# Patient Record
Sex: Female | Born: 1935 | Race: White | Hispanic: No | Marital: Married | Smoking: Former smoker
Health system: Southern US, Community
[De-identification: ages and names within clinical notes are randomized; demographics above are authoritative.]

## PROBLEM LIST (undated history)

## (undated) DIAGNOSIS — E785 Hyperlipidemia, unspecified: Secondary | ICD-10-CM

## (undated) DIAGNOSIS — N390 Urinary tract infection, site not specified: Secondary | ICD-10-CM

## (undated) DIAGNOSIS — Z5189 Encounter for other specified aftercare: Secondary | ICD-10-CM

## (undated) DIAGNOSIS — D497 Neoplasm of unspecified behavior of endocrine glands and other parts of nervous system: Secondary | ICD-10-CM

## (undated) DIAGNOSIS — E8881 Metabolic syndrome: Secondary | ICD-10-CM

## (undated) DIAGNOSIS — R002 Palpitations: Secondary | ICD-10-CM

## (undated) DIAGNOSIS — I10 Essential (primary) hypertension: Secondary | ICD-10-CM

## (undated) DIAGNOSIS — K589 Irritable bowel syndrome without diarrhea: Secondary | ICD-10-CM

## (undated) DIAGNOSIS — D352 Benign neoplasm of pituitary gland: Secondary | ICD-10-CM

## (undated) DIAGNOSIS — B029 Zoster without complications: Secondary | ICD-10-CM

## (undated) DIAGNOSIS — M419 Scoliosis, unspecified: Secondary | ICD-10-CM

## (undated) DIAGNOSIS — IMO0001 Reserved for inherently not codable concepts without codable children: Secondary | ICD-10-CM

## (undated) DIAGNOSIS — J449 Chronic obstructive pulmonary disease, unspecified: Secondary | ICD-10-CM

## (undated) DIAGNOSIS — K2100 Gastro-esophageal reflux disease with esophagitis, without bleeding: Secondary | ICD-10-CM

## (undated) DIAGNOSIS — G2581 Restless legs syndrome: Secondary | ICD-10-CM

## (undated) HISTORY — DX: Essential (primary) hypertension: I10

## (undated) HISTORY — PX: ANKLE FRACTURE SURGERY: SHX122

## (undated) HISTORY — DX: Metabolic syndrome: E88.810

## (undated) HISTORY — DX: Scoliosis, unspecified: M41.9

## (undated) HISTORY — DX: Palpitations: R00.2

## (undated) HISTORY — PX: CHOLECYSTECTOMY: SHX55

## (undated) HISTORY — PX: PARTIAL HYSTERECTOMY: SHX80

## (undated) HISTORY — DX: Hyperlipidemia, unspecified: E78.5

## (undated) HISTORY — DX: Benign neoplasm of pituitary gland: D35.2

## (undated) HISTORY — PX: BREAST EXCISIONAL BIOPSY: SUR124

## (undated) HISTORY — DX: Metabolic syndrome: E88.81

## (undated) HISTORY — PX: FINGER AMPUTATION: SHX636

## (undated) HISTORY — DX: Urinary tract infection, site not specified: N39.0

## (undated) HISTORY — DX: Irritable bowel syndrome, unspecified: K58.9

## (undated) HISTORY — DX: Gastro-esophageal reflux disease with esophagitis, without bleeding: K21.00

## (undated) HISTORY — DX: Neoplasm of unspecified behavior of endocrine glands and other parts of nervous system: D49.7

## (undated) HISTORY — DX: Chronic obstructive pulmonary disease, unspecified: J44.9

## (undated) HISTORY — PX: OTHER SURGICAL HISTORY: SHX169

## (undated) HISTORY — DX: Restless legs syndrome: G25.81

## (undated) HISTORY — DX: Gastro-esophageal reflux disease with esophagitis: K21.0

---

## 1989-08-10 DIAGNOSIS — D352 Benign neoplasm of pituitary gland: Secondary | ICD-10-CM

## 1989-08-10 HISTORY — DX: Benign neoplasm of pituitary gland: D35.2

## 1998-03-07 ENCOUNTER — Emergency Department (HOSPITAL_COMMUNITY): Admission: EM | Admit: 1998-03-07 | Discharge: 1998-03-07 | Payer: Self-pay

## 1999-09-26 ENCOUNTER — Ambulatory Visit (HOSPITAL_COMMUNITY): Admission: RE | Admit: 1999-09-26 | Discharge: 1999-09-26 | Payer: Self-pay | Admitting: Neurosurgery

## 1999-09-26 ENCOUNTER — Encounter: Payer: Self-pay | Admitting: Neurosurgery

## 1999-11-18 ENCOUNTER — Encounter (INDEPENDENT_AMBULATORY_CARE_PROVIDER_SITE_OTHER): Payer: Self-pay | Admitting: *Deleted

## 1999-11-18 ENCOUNTER — Ambulatory Visit (HOSPITAL_COMMUNITY): Admission: RE | Admit: 1999-11-18 | Discharge: 1999-11-18 | Payer: Self-pay | Admitting: *Deleted

## 1999-11-20 ENCOUNTER — Encounter: Payer: Self-pay | Admitting: Internal Medicine

## 1999-11-20 ENCOUNTER — Encounter: Admission: RE | Admit: 1999-11-20 | Discharge: 1999-11-20 | Payer: Self-pay | Admitting: Internal Medicine

## 2001-01-14 ENCOUNTER — Encounter: Admission: RE | Admit: 2001-01-14 | Discharge: 2001-01-14 | Payer: Self-pay | Admitting: Obstetrics and Gynecology

## 2001-01-14 ENCOUNTER — Encounter: Payer: Self-pay | Admitting: Obstetrics and Gynecology

## 2001-06-15 ENCOUNTER — Encounter: Admission: RE | Admit: 2001-06-15 | Discharge: 2001-06-15 | Payer: Self-pay | Admitting: Obstetrics and Gynecology

## 2001-06-15 ENCOUNTER — Encounter: Payer: Self-pay | Admitting: Obstetrics and Gynecology

## 2002-05-22 ENCOUNTER — Encounter: Admission: RE | Admit: 2002-05-22 | Discharge: 2002-05-22 | Payer: Self-pay | Admitting: Obstetrics and Gynecology

## 2002-05-22 ENCOUNTER — Encounter: Payer: Self-pay | Admitting: Obstetrics and Gynecology

## 2002-06-28 ENCOUNTER — Other Ambulatory Visit: Admission: RE | Admit: 2002-06-28 | Discharge: 2002-06-28 | Payer: Self-pay | Admitting: Obstetrics and Gynecology

## 2003-01-09 HISTORY — PX: ESOPHAGEAL DILATION: SHX303

## 2003-03-15 ENCOUNTER — Encounter: Admission: RE | Admit: 2003-03-15 | Discharge: 2003-03-15 | Payer: Self-pay | Admitting: Orthopedic Surgery

## 2003-03-15 ENCOUNTER — Encounter: Payer: Self-pay | Admitting: Orthopedic Surgery

## 2003-09-28 ENCOUNTER — Encounter: Admission: RE | Admit: 2003-09-28 | Discharge: 2003-09-28 | Payer: Self-pay | Admitting: Obstetrics and Gynecology

## 2004-04-25 ENCOUNTER — Encounter: Admission: RE | Admit: 2004-04-25 | Discharge: 2004-04-25 | Payer: Self-pay | Admitting: Internal Medicine

## 2004-06-12 ENCOUNTER — Ambulatory Visit: Payer: Self-pay | Admitting: Gastroenterology

## 2004-06-25 ENCOUNTER — Ambulatory Visit: Payer: Self-pay | Admitting: Internal Medicine

## 2004-06-27 ENCOUNTER — Ambulatory Visit: Payer: Self-pay | Admitting: Internal Medicine

## 2004-08-29 ENCOUNTER — Ambulatory Visit: Payer: Self-pay | Admitting: Cardiology

## 2004-09-09 ENCOUNTER — Ambulatory Visit: Payer: Self-pay

## 2004-09-09 ENCOUNTER — Ambulatory Visit: Payer: Self-pay | Admitting: Cardiology

## 2004-10-16 ENCOUNTER — Ambulatory Visit: Payer: Self-pay | Admitting: Cardiology

## 2004-12-25 ENCOUNTER — Encounter: Admission: RE | Admit: 2004-12-25 | Discharge: 2004-12-25 | Payer: Self-pay | Admitting: Obstetrics and Gynecology

## 2005-01-19 ENCOUNTER — Ambulatory Visit: Payer: Self-pay | Admitting: Internal Medicine

## 2005-09-24 ENCOUNTER — Ambulatory Visit: Payer: Self-pay | Admitting: Internal Medicine

## 2005-10-01 ENCOUNTER — Ambulatory Visit: Payer: Self-pay | Admitting: Internal Medicine

## 2006-03-19 ENCOUNTER — Ambulatory Visit: Payer: Self-pay | Admitting: Internal Medicine

## 2006-04-06 ENCOUNTER — Encounter: Admission: RE | Admit: 2006-04-06 | Discharge: 2006-04-06 | Payer: Self-pay | Admitting: Internal Medicine

## 2006-05-26 ENCOUNTER — Ambulatory Visit: Payer: Self-pay | Admitting: Internal Medicine

## 2006-09-15 ENCOUNTER — Ambulatory Visit: Payer: Self-pay | Admitting: Internal Medicine

## 2006-09-21 ENCOUNTER — Ambulatory Visit: Payer: Self-pay | Admitting: Internal Medicine

## 2006-09-21 LAB — CONVERTED CEMR LAB
ALT: 9 units/L (ref 0–40)
BUN: 13 mg/dL (ref 6–23)
Basophils Relative: 1.6 % — ABNORMAL HIGH (ref 0.0–1.0)
Creatinine, Ser: 0.8 mg/dL (ref 0.4–1.2)
Eosinophils Relative: 4.2 % (ref 0.0–5.0)
Hemoglobin: 14.5 g/dL (ref 12.0–15.0)
Lymphocytes Relative: 22.8 % (ref 12.0–46.0)
Monocytes Absolute: 0.5 10*3/uL (ref 0.2–0.7)
Neutro Abs: 3.3 10*3/uL (ref 1.4–7.7)
Platelets: 274 10*3/uL (ref 150–400)
Potassium: 4.5 meq/L (ref 3.5–5.1)
RDW: 12.2 % (ref 11.5–14.6)
VLDL: 24 mg/dL (ref 0–40)
WBC: 5.3 10*3/uL (ref 4.5–10.5)

## 2006-09-24 ENCOUNTER — Ambulatory Visit: Payer: Self-pay

## 2006-10-05 ENCOUNTER — Ambulatory Visit: Payer: Self-pay | Admitting: Family Medicine

## 2006-10-06 ENCOUNTER — Ambulatory Visit: Payer: Self-pay | Admitting: Internal Medicine

## 2006-10-28 ENCOUNTER — Ambulatory Visit: Payer: Self-pay | Admitting: Internal Medicine

## 2006-11-24 DIAGNOSIS — Z9079 Acquired absence of other genital organ(s): Secondary | ICD-10-CM | POA: Insufficient documentation

## 2006-11-24 DIAGNOSIS — D352 Benign neoplasm of pituitary gland: Secondary | ICD-10-CM | POA: Insufficient documentation

## 2006-11-24 DIAGNOSIS — D353 Benign neoplasm of craniopharyngeal duct: Secondary | ICD-10-CM

## 2007-02-28 ENCOUNTER — Ambulatory Visit: Payer: Self-pay | Admitting: Internal Medicine

## 2007-02-28 DIAGNOSIS — G2581 Restless legs syndrome: Secondary | ICD-10-CM | POA: Insufficient documentation

## 2007-02-28 DIAGNOSIS — K589 Irritable bowel syndrome without diarrhea: Secondary | ICD-10-CM | POA: Insufficient documentation

## 2007-03-14 ENCOUNTER — Telehealth: Payer: Self-pay | Admitting: Internal Medicine

## 2007-05-20 ENCOUNTER — Encounter: Payer: Self-pay | Admitting: Internal Medicine

## 2007-06-30 ENCOUNTER — Ambulatory Visit: Payer: Self-pay | Admitting: Internal Medicine

## 2007-08-05 ENCOUNTER — Ambulatory Visit: Payer: Self-pay | Admitting: Internal Medicine

## 2007-08-05 DIAGNOSIS — E8881 Metabolic syndrome: Secondary | ICD-10-CM

## 2007-08-05 DIAGNOSIS — E782 Mixed hyperlipidemia: Secondary | ICD-10-CM

## 2007-08-05 DIAGNOSIS — E785 Hyperlipidemia, unspecified: Secondary | ICD-10-CM | POA: Insufficient documentation

## 2007-08-05 DIAGNOSIS — M2550 Pain in unspecified joint: Secondary | ICD-10-CM | POA: Insufficient documentation

## 2007-08-05 DIAGNOSIS — R002 Palpitations: Secondary | ICD-10-CM | POA: Insufficient documentation

## 2007-08-05 DIAGNOSIS — Z8719 Personal history of other diseases of the digestive system: Secondary | ICD-10-CM

## 2007-08-11 HISTORY — PX: KNEE SURGERY: SHX244

## 2007-08-12 ENCOUNTER — Telehealth (INDEPENDENT_AMBULATORY_CARE_PROVIDER_SITE_OTHER): Payer: Self-pay | Admitting: *Deleted

## 2007-08-15 ENCOUNTER — Encounter (INDEPENDENT_AMBULATORY_CARE_PROVIDER_SITE_OTHER): Payer: Self-pay | Admitting: *Deleted

## 2007-08-16 ENCOUNTER — Ambulatory Visit: Payer: Self-pay | Admitting: Internal Medicine

## 2007-08-16 ENCOUNTER — Encounter (INDEPENDENT_AMBULATORY_CARE_PROVIDER_SITE_OTHER): Payer: Self-pay | Admitting: *Deleted

## 2007-08-17 ENCOUNTER — Ambulatory Visit: Payer: Self-pay | Admitting: Internal Medicine

## 2007-08-17 ENCOUNTER — Inpatient Hospital Stay (HOSPITAL_COMMUNITY): Admission: RE | Admit: 2007-08-17 | Discharge: 2007-08-22 | Payer: Self-pay | Admitting: Orthopedic Surgery

## 2007-08-24 ENCOUNTER — Encounter: Payer: Self-pay | Admitting: Internal Medicine

## 2007-08-31 ENCOUNTER — Ambulatory Visit: Payer: Self-pay | Admitting: Internal Medicine

## 2007-08-31 DIAGNOSIS — I10 Essential (primary) hypertension: Secondary | ICD-10-CM | POA: Insufficient documentation

## 2007-08-31 DIAGNOSIS — E041 Nontoxic single thyroid nodule: Secondary | ICD-10-CM

## 2007-09-02 ENCOUNTER — Ambulatory Visit: Payer: Self-pay | Admitting: Internal Medicine

## 2007-09-02 DIAGNOSIS — Z96659 Presence of unspecified artificial knee joint: Secondary | ICD-10-CM

## 2007-09-02 LAB — CONVERTED CEMR LAB
INR: 1.7
Prothrombin Time: 16 s

## 2007-09-05 ENCOUNTER — Encounter: Admission: RE | Admit: 2007-09-05 | Discharge: 2007-09-05 | Payer: Self-pay | Admitting: Internal Medicine

## 2007-09-06 ENCOUNTER — Encounter (INDEPENDENT_AMBULATORY_CARE_PROVIDER_SITE_OTHER): Payer: Self-pay | Admitting: *Deleted

## 2007-09-07 ENCOUNTER — Ambulatory Visit: Payer: Self-pay | Admitting: Internal Medicine

## 2007-09-07 LAB — CONVERTED CEMR LAB: INR: 3.8

## 2007-09-13 ENCOUNTER — Ambulatory Visit: Payer: Self-pay | Admitting: Internal Medicine

## 2007-09-13 LAB — CONVERTED CEMR LAB
INR: 3.1
Prothrombin Time: 21.3 s

## 2007-09-28 ENCOUNTER — Encounter: Admission: RE | Admit: 2007-09-28 | Discharge: 2007-09-28 | Payer: Self-pay | Admitting: Internal Medicine

## 2007-10-03 ENCOUNTER — Encounter: Payer: Self-pay | Admitting: Internal Medicine

## 2007-10-03 ENCOUNTER — Encounter (INDEPENDENT_AMBULATORY_CARE_PROVIDER_SITE_OTHER): Payer: Self-pay | Admitting: *Deleted

## 2007-10-14 ENCOUNTER — Ambulatory Visit: Payer: Self-pay | Admitting: Internal Medicine

## 2007-10-17 ENCOUNTER — Encounter (INDEPENDENT_AMBULATORY_CARE_PROVIDER_SITE_OTHER): Payer: Self-pay | Admitting: *Deleted

## 2007-11-16 ENCOUNTER — Encounter: Payer: Self-pay | Admitting: Internal Medicine

## 2007-12-08 ENCOUNTER — Ambulatory Visit: Payer: Self-pay | Admitting: Internal Medicine

## 2007-12-08 DIAGNOSIS — K5900 Constipation, unspecified: Secondary | ICD-10-CM | POA: Insufficient documentation

## 2007-12-08 LAB — CONVERTED CEMR LAB
BUN: 12 mg/dL (ref 6–23)
Creatinine, Ser: 0.6 mg/dL (ref 0.4–1.2)
Hgb A1c MFr Bld: 6.2 % — ABNORMAL HIGH (ref 4.6–6.0)
LDL Cholesterol: 87 mg/dL (ref 0–99)
TSH: 1.22 microintl units/mL (ref 0.35–5.50)
Total CHOL/HDL Ratio: 3.6
Triglycerides: 147 mg/dL (ref 0–149)

## 2007-12-12 ENCOUNTER — Telehealth (INDEPENDENT_AMBULATORY_CARE_PROVIDER_SITE_OTHER): Payer: Self-pay | Admitting: *Deleted

## 2007-12-12 ENCOUNTER — Emergency Department (HOSPITAL_COMMUNITY): Admission: EM | Admit: 2007-12-12 | Discharge: 2007-12-12 | Payer: Self-pay | Admitting: Emergency Medicine

## 2007-12-20 ENCOUNTER — Encounter (INDEPENDENT_AMBULATORY_CARE_PROVIDER_SITE_OTHER): Payer: Self-pay | Admitting: *Deleted

## 2008-01-05 ENCOUNTER — Ambulatory Visit: Payer: Self-pay | Admitting: Internal Medicine

## 2008-01-05 DIAGNOSIS — J309 Allergic rhinitis, unspecified: Secondary | ICD-10-CM | POA: Insufficient documentation

## 2008-01-05 LAB — CONVERTED CEMR LAB
Blood in Urine, dipstick: NEGATIVE
Glucose, Urine, Semiquant: NEGATIVE
Ketones, urine, test strip: NEGATIVE
Nitrite: NEGATIVE
Protein, U semiquant: NEGATIVE
Specific Gravity, Urine: <1.005
pH: 6.5

## 2008-01-07 ENCOUNTER — Encounter: Payer: Self-pay | Admitting: Internal Medicine

## 2008-01-09 ENCOUNTER — Encounter (INDEPENDENT_AMBULATORY_CARE_PROVIDER_SITE_OTHER): Payer: Self-pay | Admitting: *Deleted

## 2008-02-21 ENCOUNTER — Encounter: Admission: RE | Admit: 2008-02-21 | Discharge: 2008-02-21 | Payer: Self-pay | Admitting: Internal Medicine

## 2008-02-29 ENCOUNTER — Telehealth (INDEPENDENT_AMBULATORY_CARE_PROVIDER_SITE_OTHER): Payer: Self-pay | Admitting: *Deleted

## 2008-03-02 ENCOUNTER — Telehealth (INDEPENDENT_AMBULATORY_CARE_PROVIDER_SITE_OTHER): Payer: Self-pay | Admitting: *Deleted

## 2008-05-17 ENCOUNTER — Ambulatory Visit: Payer: Self-pay | Admitting: Internal Medicine

## 2008-06-08 ENCOUNTER — Encounter: Admission: RE | Admit: 2008-06-08 | Discharge: 2008-06-08 | Payer: Self-pay | Admitting: Obstetrics and Gynecology

## 2008-08-17 ENCOUNTER — Ambulatory Visit: Payer: Self-pay | Admitting: Internal Medicine

## 2008-08-17 DIAGNOSIS — E669 Obesity, unspecified: Secondary | ICD-10-CM | POA: Insufficient documentation

## 2008-08-17 DIAGNOSIS — T887XXA Unspecified adverse effect of drug or medicament, initial encounter: Secondary | ICD-10-CM | POA: Insufficient documentation

## 2008-08-21 ENCOUNTER — Encounter (INDEPENDENT_AMBULATORY_CARE_PROVIDER_SITE_OTHER): Payer: Self-pay | Admitting: *Deleted

## 2008-08-21 ENCOUNTER — Telehealth (INDEPENDENT_AMBULATORY_CARE_PROVIDER_SITE_OTHER): Payer: Self-pay | Admitting: *Deleted

## 2008-08-29 ENCOUNTER — Telehealth: Payer: Self-pay | Admitting: Internal Medicine

## 2008-08-29 ENCOUNTER — Inpatient Hospital Stay (HOSPITAL_COMMUNITY): Admission: RE | Admit: 2008-08-29 | Discharge: 2008-09-03 | Payer: Self-pay | Admitting: Orthopedic Surgery

## 2008-09-04 ENCOUNTER — Telehealth: Payer: Self-pay | Admitting: Internal Medicine

## 2008-09-27 ENCOUNTER — Emergency Department (HOSPITAL_COMMUNITY): Admission: EM | Admit: 2008-09-27 | Discharge: 2008-09-28 | Payer: Self-pay | Admitting: Emergency Medicine

## 2008-09-28 ENCOUNTER — Ambulatory Visit: Payer: Self-pay | Admitting: Internal Medicine

## 2008-09-28 DIAGNOSIS — R82998 Other abnormal findings in urine: Secondary | ICD-10-CM

## 2008-09-28 DIAGNOSIS — R109 Unspecified abdominal pain: Secondary | ICD-10-CM | POA: Insufficient documentation

## 2008-09-29 ENCOUNTER — Encounter: Payer: Self-pay | Admitting: Internal Medicine

## 2008-10-01 ENCOUNTER — Telehealth: Payer: Self-pay | Admitting: Internal Medicine

## 2008-10-01 ENCOUNTER — Ambulatory Visit: Payer: Self-pay | Admitting: Internal Medicine

## 2008-10-01 DIAGNOSIS — R6883 Chills (without fever): Secondary | ICD-10-CM

## 2008-10-01 DIAGNOSIS — R61 Generalized hyperhidrosis: Secondary | ICD-10-CM | POA: Insufficient documentation

## 2008-10-01 DIAGNOSIS — R5383 Other fatigue: Secondary | ICD-10-CM

## 2008-10-01 DIAGNOSIS — R5381 Other malaise: Secondary | ICD-10-CM

## 2008-10-01 DIAGNOSIS — R630 Anorexia: Secondary | ICD-10-CM

## 2008-10-03 ENCOUNTER — Encounter (INDEPENDENT_AMBULATORY_CARE_PROVIDER_SITE_OTHER): Payer: Self-pay | Admitting: *Deleted

## 2008-10-03 LAB — CONVERTED CEMR LAB
Albumin: 3.9 g/dL (ref 3.5–5.2)
Alkaline Phosphatase: 63 units/L (ref 39–117)
Basophils Absolute: 0 10*3/uL (ref 0.0–0.1)
Bilirubin, Direct: 0.1 mg/dL (ref 0.0–0.3)
Chloride: 106 meq/L (ref 96–112)
Eosinophils Absolute: 0.2 10*3/uL (ref 0.0–0.7)
GFR calc Af Amer: 106 mL/min
GFR calc non Af Amer: 88 mL/min
HCT: 38.1 % (ref 36.0–46.0)
MCV: 92.6 fL (ref 78.0–100.0)
Monocytes Absolute: 0.3 10*3/uL (ref 0.1–1.0)
Neutrophils Relative %: 68.8 % (ref 43.0–77.0)
Platelets: 282 10*3/uL (ref 150–400)
Potassium: 4.8 meq/L (ref 3.5–5.1)
RDW: 13.8 % (ref 11.5–14.6)
Sodium: 143 meq/L (ref 135–145)
Total Bilirubin: 0.7 mg/dL (ref 0.3–1.2)

## 2008-10-15 ENCOUNTER — Encounter: Payer: Self-pay | Admitting: Internal Medicine

## 2008-10-15 ENCOUNTER — Ambulatory Visit: Payer: Self-pay | Admitting: Internal Medicine

## 2008-11-01 ENCOUNTER — Ambulatory Visit: Payer: Self-pay | Admitting: Internal Medicine

## 2008-11-01 DIAGNOSIS — M48061 Spinal stenosis, lumbar region without neurogenic claudication: Secondary | ICD-10-CM

## 2008-11-08 ENCOUNTER — Encounter (INDEPENDENT_AMBULATORY_CARE_PROVIDER_SITE_OTHER): Payer: Self-pay | Admitting: *Deleted

## 2008-12-12 ENCOUNTER — Encounter: Payer: Self-pay | Admitting: Internal Medicine

## 2009-03-13 ENCOUNTER — Encounter: Admission: RE | Admit: 2009-03-13 | Discharge: 2009-03-13 | Payer: Self-pay | Admitting: Internal Medicine

## 2009-03-20 ENCOUNTER — Ambulatory Visit: Payer: Self-pay | Admitting: Internal Medicine

## 2009-03-20 DIAGNOSIS — R635 Abnormal weight gain: Secondary | ICD-10-CM | POA: Insufficient documentation

## 2009-03-20 DIAGNOSIS — R209 Unspecified disturbances of skin sensation: Secondary | ICD-10-CM

## 2009-03-20 LAB — CONVERTED CEMR LAB
Folate: 14.1 ng/mL
Iron: 99 ug/dL (ref 42–145)
Saturation Ratios: 27.7 % (ref 20.0–50.0)
TSH: 1.45 microintl units/mL (ref 0.35–5.50)
Transferrin: 255.7 mg/dL (ref 212.0–360.0)

## 2009-03-21 ENCOUNTER — Encounter (INDEPENDENT_AMBULATORY_CARE_PROVIDER_SITE_OTHER): Payer: Self-pay | Admitting: *Deleted

## 2009-03-29 ENCOUNTER — Ambulatory Visit: Payer: Self-pay | Admitting: Internal Medicine

## 2009-03-29 DIAGNOSIS — J449 Chronic obstructive pulmonary disease, unspecified: Secondary | ICD-10-CM

## 2009-03-29 DIAGNOSIS — J4489 Other specified chronic obstructive pulmonary disease: Secondary | ICD-10-CM | POA: Insufficient documentation

## 2009-04-29 ENCOUNTER — Encounter: Payer: Self-pay | Admitting: Internal Medicine

## 2009-07-10 ENCOUNTER — Telehealth: Payer: Self-pay | Admitting: Internal Medicine

## 2009-07-11 ENCOUNTER — Ambulatory Visit: Payer: Self-pay | Admitting: Internal Medicine

## 2009-07-12 ENCOUNTER — Encounter (INDEPENDENT_AMBULATORY_CARE_PROVIDER_SITE_OTHER): Payer: Self-pay | Admitting: *Deleted

## 2009-09-02 ENCOUNTER — Telehealth (INDEPENDENT_AMBULATORY_CARE_PROVIDER_SITE_OTHER): Payer: Self-pay | Admitting: *Deleted

## 2009-10-11 ENCOUNTER — Telehealth (INDEPENDENT_AMBULATORY_CARE_PROVIDER_SITE_OTHER): Payer: Self-pay | Admitting: *Deleted

## 2009-12-13 ENCOUNTER — Encounter: Payer: Self-pay | Admitting: Internal Medicine

## 2009-12-19 ENCOUNTER — Encounter: Payer: Self-pay | Admitting: Internal Medicine

## 2009-12-20 ENCOUNTER — Encounter (INDEPENDENT_AMBULATORY_CARE_PROVIDER_SITE_OTHER): Payer: Self-pay | Admitting: *Deleted

## 2010-01-07 ENCOUNTER — Ambulatory Visit: Payer: Self-pay | Admitting: Internal Medicine

## 2010-01-07 DIAGNOSIS — H3560 Retinal hemorrhage, unspecified eye: Secondary | ICD-10-CM | POA: Insufficient documentation

## 2010-01-09 ENCOUNTER — Ambulatory Visit: Payer: Self-pay | Admitting: Internal Medicine

## 2010-01-09 LAB — CONVERTED CEMR LAB
ALT: 12 units/L (ref 0–35)
Albumin: 4.2 g/dL (ref 3.5–5.2)
Basophils Relative: 1.5 % (ref 0.0–3.0)
Bilirubin, Direct: 0.2 mg/dL (ref 0.0–0.3)
HCT: 41.1 % (ref 36.0–46.0)
Hemoglobin: 14.4 g/dL (ref 12.0–15.0)
Lymphocytes Relative: 23.7 % (ref 12.0–46.0)
MCHC: 35 g/dL (ref 30.0–36.0)
Monocytes Relative: 11.5 % (ref 3.0–12.0)
Neutro Abs: 2.6 10*3/uL (ref 1.4–7.7)
RBC: 4.43 M/uL (ref 3.87–5.11)
Total CHOL/HDL Ratio: 3
Total Protein: 6.5 g/dL (ref 6.0–8.3)
Triglycerides: 140 mg/dL (ref 0.0–149.0)

## 2010-01-14 ENCOUNTER — Ambulatory Visit: Payer: Self-pay | Admitting: Internal Medicine

## 2010-01-14 DIAGNOSIS — R509 Fever, unspecified: Secondary | ICD-10-CM

## 2010-01-14 DIAGNOSIS — L049 Acute lymphadenitis, unspecified: Secondary | ICD-10-CM

## 2010-04-07 IMAGING — CR DG CHEST 2V
2 series · 2 of 2 positions shown · non-contrast
Comparison: 08/18/2007.

CLINICAL DATA: Osteoarthritis of the right knee.  Preoperative
chest.

CHEST - 2 VIEW

[w chest pa]
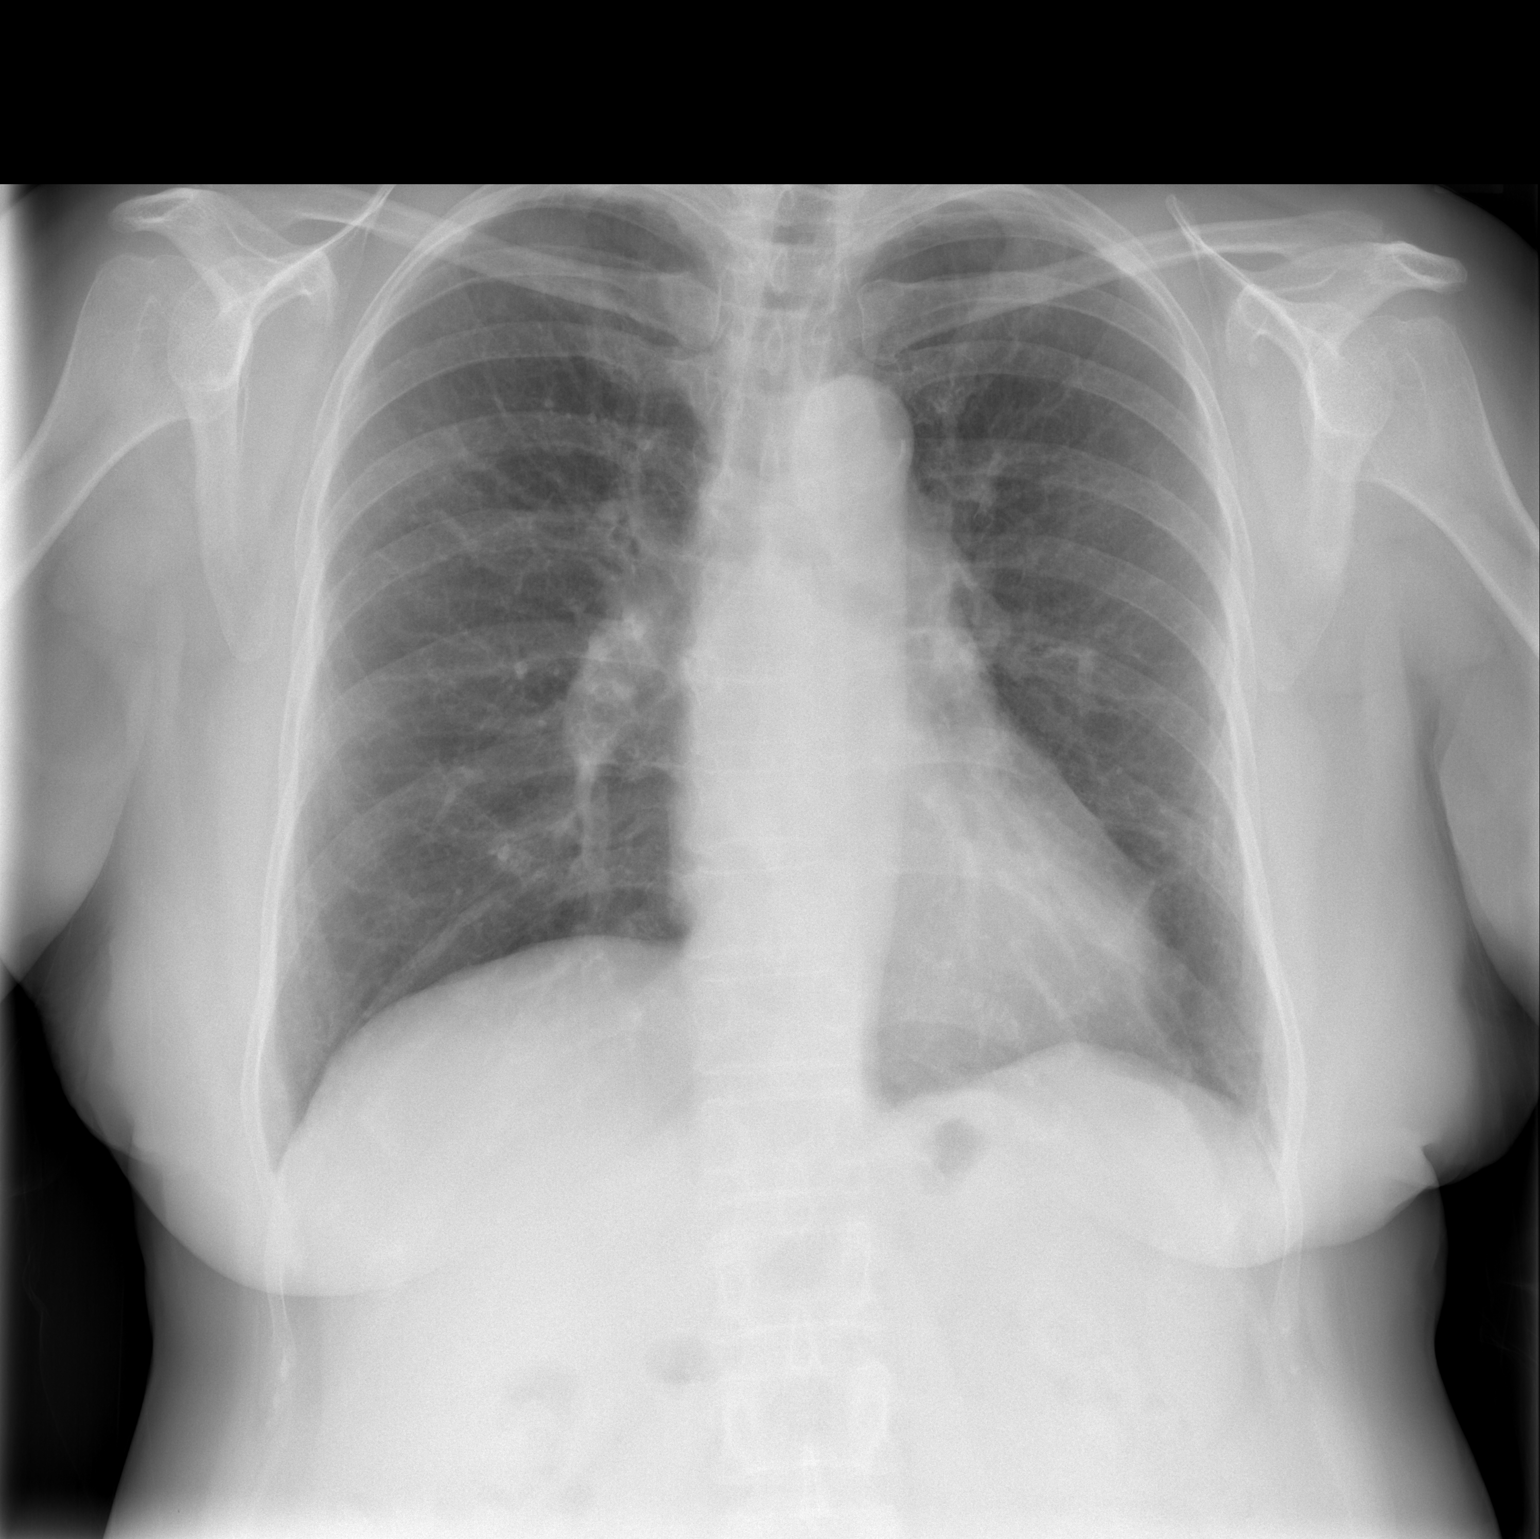

[w chest lat]
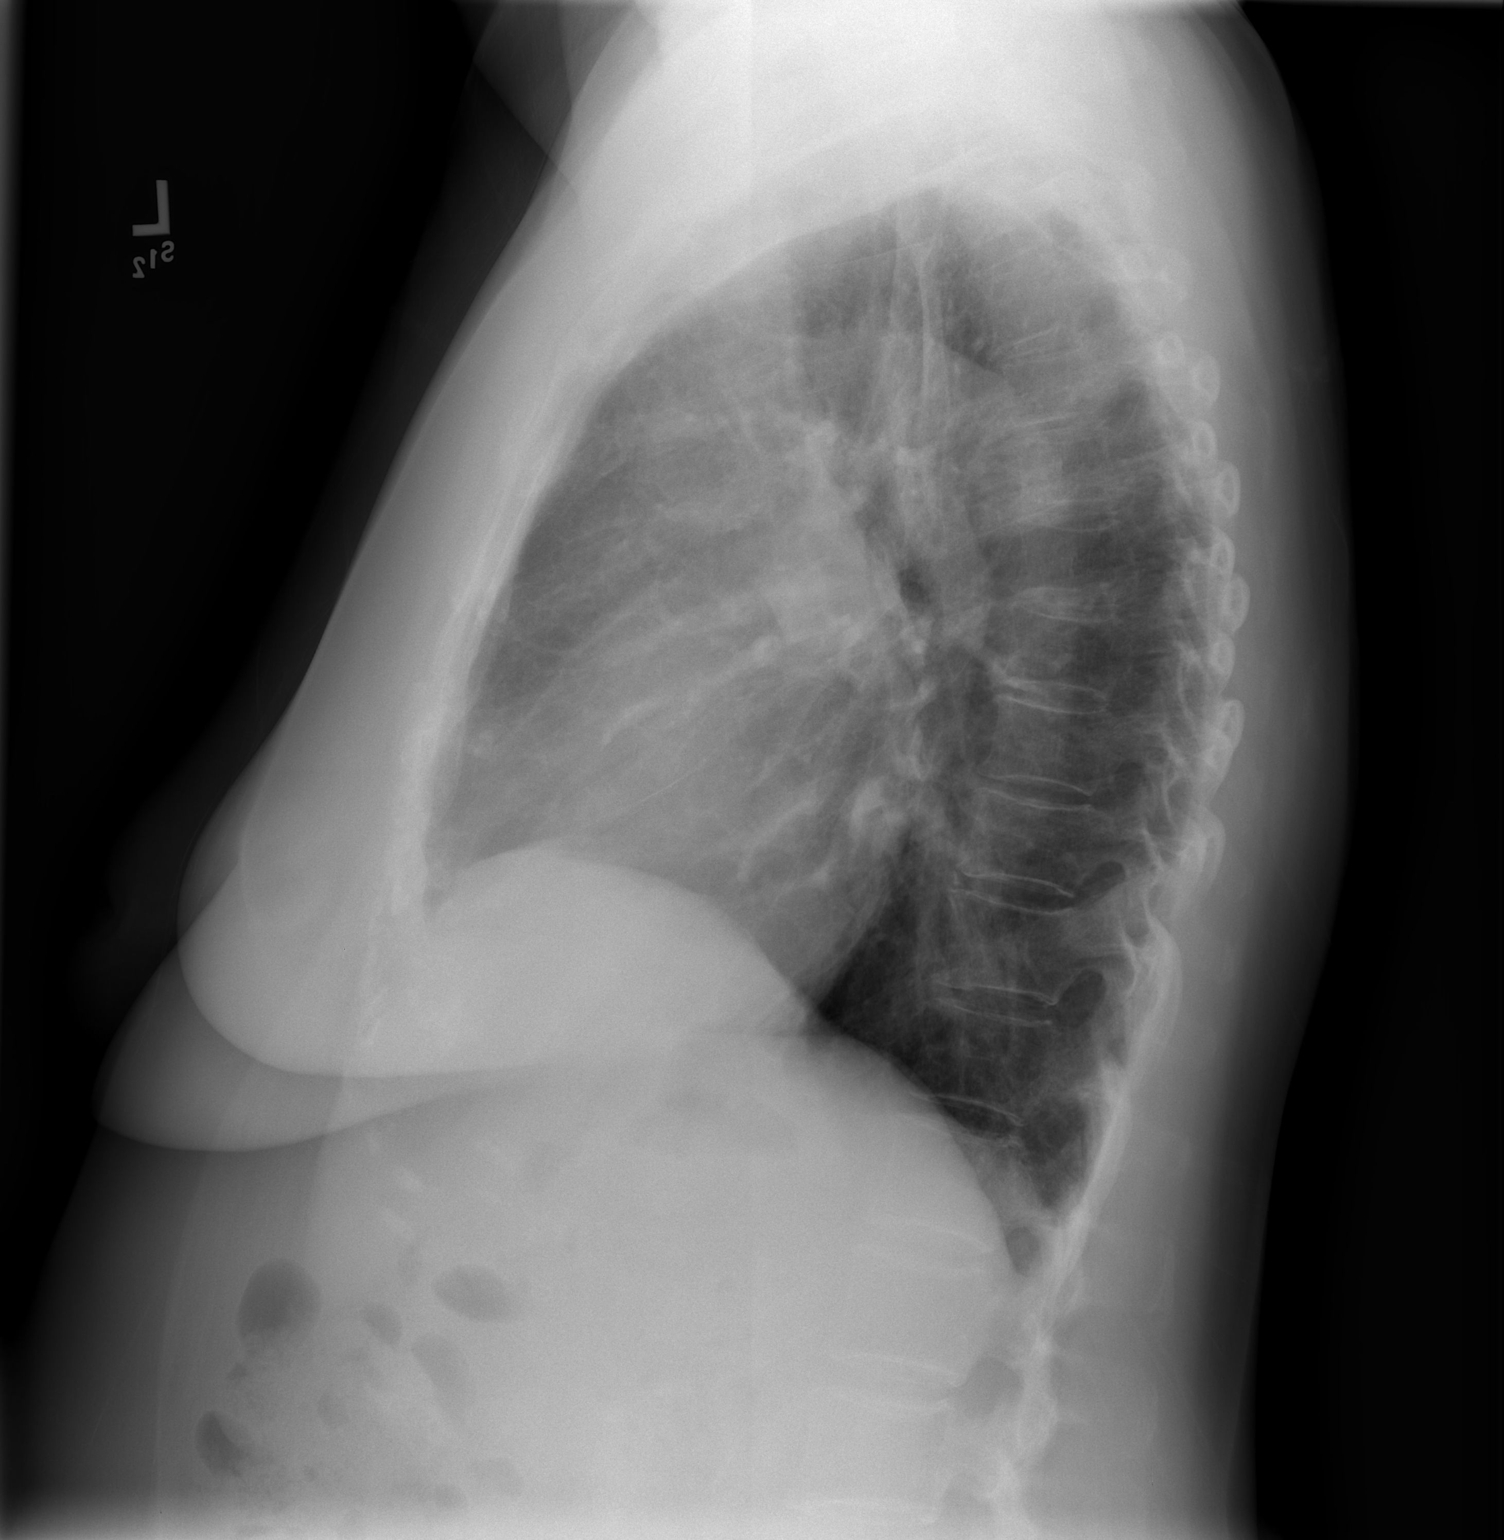

[2 of 2 positions shown; findings below may reference images not displayed]

FINDINGS: The heart is upper normal in size.  There are no
edematous or infiltrative changes.  There is no evidence for
mediastinal or hilar adenopathy.  The patient is filmed in a better
degree of inspiration on today's study.
IMPRESSION: 1.  No evidence for active chest disease.
2.  Improved aeration of the lung bases.

## 2010-04-22 ENCOUNTER — Encounter: Admission: RE | Admit: 2010-04-22 | Discharge: 2010-04-22 | Payer: Self-pay | Admitting: Internal Medicine

## 2010-04-22 LAB — HM MAMMOGRAPHY: HM Mammogram: NEGATIVE

## 2010-05-14 ENCOUNTER — Telehealth: Payer: Self-pay | Admitting: Internal Medicine

## 2010-06-25 ENCOUNTER — Encounter: Payer: Self-pay | Admitting: Internal Medicine

## 2010-06-30 ENCOUNTER — Telehealth (INDEPENDENT_AMBULATORY_CARE_PROVIDER_SITE_OTHER): Payer: Self-pay | Admitting: *Deleted

## 2010-07-11 ENCOUNTER — Telehealth: Payer: Self-pay | Admitting: Internal Medicine

## 2010-07-23 ENCOUNTER — Ambulatory Visit: Payer: Self-pay | Admitting: Internal Medicine

## 2010-07-31 ENCOUNTER — Telehealth (INDEPENDENT_AMBULATORY_CARE_PROVIDER_SITE_OTHER): Payer: Self-pay | Admitting: *Deleted

## 2010-07-31 LAB — CONVERTED CEMR LAB
Bilirubin, Direct: 0.1 mg/dL (ref 0.0–0.3)
Direct LDL: 151.1 mg/dL
HDL: 46.6 mg/dL (ref >39.00)
Total Bilirubin: 1 mg/dL (ref 0.3–1.2)
Total CHOL/HDL Ratio: 5
Triglycerides: 135 mg/dL (ref 0.0–149.0)
VLDL: 27 mg/dL (ref 0.0–40.0)

## 2010-08-06 ENCOUNTER — Encounter: Payer: Self-pay | Admitting: Internal Medicine

## 2010-08-06 ENCOUNTER — Ambulatory Visit: Payer: Self-pay | Admitting: Internal Medicine

## 2010-08-06 DIAGNOSIS — E119 Type 2 diabetes mellitus without complications: Secondary | ICD-10-CM

## 2010-08-06 DIAGNOSIS — M899 Disorder of bone, unspecified: Secondary | ICD-10-CM | POA: Insufficient documentation

## 2010-08-06 DIAGNOSIS — M949 Disorder of cartilage, unspecified: Secondary | ICD-10-CM

## 2010-08-06 DIAGNOSIS — IMO0001 Reserved for inherently not codable concepts without codable children: Secondary | ICD-10-CM | POA: Insufficient documentation

## 2010-08-07 LAB — CONVERTED CEMR LAB
Creatinine,U: 77.4 mg/dL
Hgb A1c MFr Bld: 6.4 % (ref 4.6–6.5)
Microalb Creat Ratio: 0.6 mg/g (ref 0.0–30.0)
Total CK: 56 units/L (ref 7–177)

## 2010-08-13 ENCOUNTER — Ambulatory Visit
Admission: RE | Admit: 2010-08-13 | Discharge: 2010-08-13 | Payer: Self-pay | Source: Home / Self Care | Attending: Internal Medicine | Admitting: Internal Medicine

## 2010-08-13 DIAGNOSIS — E559 Vitamin D deficiency, unspecified: Secondary | ICD-10-CM | POA: Insufficient documentation

## 2010-09-07 LAB — CONVERTED CEMR LAB
ALT: 11 units/L (ref 0–35)
AST: 16 units/L (ref 0–37)
Albumin: 3.9 g/dL (ref 3.5–5.2)
Alkaline Phosphatase: 61 units/L (ref 39–117)
BUN: 12 mg/dL (ref 6–23)
Basophils Absolute: 0 10*3/uL (ref 0.0–0.1)
Basophils Relative: 0.4 % (ref 0.0–1.0)
Cholesterol, target level: 200 mg/dL
Creatinine, Ser: 0.6 mg/dL (ref 0.4–1.2)
HDL: 50.9 mg/dL (ref >39.0)
Hemoglobin: 14.7 g/dL (ref 12.0–15.0)
LDL Cholesterol: 100 mg/dL — ABNORMAL HIGH (ref 0–99)
MCHC: 33.6 g/dL (ref 30.0–36.0)
Monocytes Absolute: 0.4 10*3/uL (ref 0.2–0.7)
Monocytes Relative: 6.4 % (ref 3.0–11.0)
Platelets: 263 10*3/uL (ref 150–400)
Potassium: 3.9 meq/L (ref 3.5–5.1)
Potassium: 4.1 meq/L (ref 3.5–5.1)
RBC: 4.6 M/uL (ref 3.87–5.11)
RDW: 11.9 % (ref 11.5–14.6)
Total Bilirubin: 0.7 mg/dL (ref 0.3–1.2)
Total CHOL/HDL Ratio: 3.6
VLDL: 31 mg/dL (ref 0–40)

## 2010-09-08 ENCOUNTER — Telehealth (INDEPENDENT_AMBULATORY_CARE_PROVIDER_SITE_OTHER): Payer: Self-pay | Admitting: *Deleted

## 2010-09-09 NOTE — Medication Information (Signed)
Summary: [facility]  Diabetes Supplies/[facility]   Imported By: Lanelle Bal 07/04/2010 14:57:01  _____________________________________________________________________  External Attachment:    Type:   Image     Comment:   External Document

## 2010-09-09 NOTE — Progress Notes (Signed)
Summary: Medication concerns  Phone Note Outgoing Call Call back at [facility] Phone [phone]   Call placed by: Shonna Chock,  September 02, 2009 12:05 PM Call placed to: Patient Summary of Call: We recieved a fax from [facility] and they were concerned about Drug Interaction. Spoke with patient and discussed while taking Fluconazole (prescribed by Dr.Dehart) do NOT take Vytorin per possible drug reactions. Patient ok'd information and indicated that she only takes Fluconazole when using metrogel. Initial call taken by: Shonna Chock,  September 02, 2009 12:07 PM

## 2010-09-09 NOTE — Progress Notes (Signed)
Summary: VYTORIN IS TOO EXPENSIVE  Phone Note Call from Patient Call back at Home Phone [phone]   Caller: Patient Summary of Call: SAYS HER VYTORIN IS NOW $183.74---CAN DR HOPPER GIVE HER ANOTHER MEDICATION OR GIVE HER GENERIC??  [facility] [address] [place] Initial call taken by: Jerolyn Shin,  May 14, 2010 2:12 PM  Follow-up for Phone Call        Per dr hopper change med to simvastatin 40 mg fasting labs in 10 weeks lipid, hep 272.4,995.20 discuss with patient, appt scheduled, rx sent to pharmacy...............Marland KitchenFelecia Deloach CMA  May 14, 2010 4:50 PM     New/Updated Medications: SIMVASTATIN 40 MG TABS (SIMVASTATIN) Take 1 tab at bedtime daily Prescriptions: SIMVASTATIN 40 MG TABS (SIMVASTATIN) Take 1 tab at bedtime daily  #30 x 2   Entered by:   Jeremy Johann CMA   Authorized by:   Marga Melnick MD   Signed by:   Jeremy Johann CMA on 05/14/2010   Method used:   Faxed to ...       [facility]  [phone]* (retail)       [address] [place], [place]  [address]       Ph: [phone] or [phone]       Fax: [phone]   RxID:   [id number]

## 2010-09-09 NOTE — Progress Notes (Signed)
Summary: refill Alprazolam  Phone Note Refill Request Message from:  Fax from Pharmacy on October 11, 2009 8:29 AM  Refills Requested: Medication #1:  CLONAZEPAM 0.5 MG TABS 1-2 at bedtime as needed [facility] [phone]   Method Requested: Fax to Local Pharmacy Next Appointment Scheduled: no appt Initial call taken by: Barb Merino,  October 11, 2009 8:30 AM  Follow-up for Phone Call        last ov 07/11/09, last refill #60 x 5 on 03/20/09 .Kandice Hams  October 11, 2009 5:00 PM  Follow-up by: Kandice Hams,  October 11, 2009 5:00 PM  Additional Follow-up for Phone Call Additional follow up Details #1::        OK #60, RX 2 Additional Follow-up by: Marga Melnick MD,  October 11, 2009 6:05 PM    Prescriptions: CLONAZEPAM 0.5 MG TABS (CLONAZEPAM) 1-2 at bedtime as needed  #60 x 2   Entered by:   Shonna Chock   Authorized by:   Marga Melnick MD   Signed by:   Shonna Chock on 10/14/2009   Method used:   Printed then faxed to ...       [facility]  [phone]* (retail)       [address] [place], [place]  [address]       Ph: [phone] or [phone]       Fax: [phone]   RxID:   [id number]

## 2010-09-09 NOTE — Progress Notes (Signed)
Summary: Side Effect of Medication  Phone Note Call from Patient Call back at Home Phone [phone]   Caller: Patient Summary of Call: Patient called this morning and LM on triage line stating that she had been recently changed to Simvastatin and she has now been experincing muscle cramps. She said she read the information provided to her from the pharmacist and it includes muscle cramps as a side effect. She would like to know what she should do. Please advise.  Initial call taken by: Harold Barban,  July 11, 2010 9:34 AM  Follow-up for Phone Call        stop the med ;after 4 months have fasting [facility] Lipid Panel (1304X) Follow-up by: Marga Melnick MD,  July 11, 2010 10:25 AM  Additional Follow-up for Phone Call Additional follow up Details #1::        Patient notified and scheduled lab appt. Additional Follow-up by: Lucious Groves CMA,  July 11, 2010 11:32 AM

## 2010-09-09 NOTE — Letter (Signed)
Summary: [facility]  [facility]   Imported By: Lanelle Bal 01/07/2010 12:11:45  _____________________________________________________________________  External Attachment:    Type:   Image     Comment:   External Document

## 2010-09-09 NOTE — Assessment & Plan Note (Signed)
Summary: f/u from Dr. Hecker//lch   Vital Signs:  Patient profile:   75 year old female Weight:      175.2 pounds BMI:     31.90 Pulse rate:   72 / minute Resp:     16 per minute BP sitting:   118 / 74  (left arm) Cuff size:   large  Vitals Entered By: Shonna Chock (Jan 07, 2010 8:59 AM) CC: follow-up visit Comments REVIEWED MED LIST, PATIENT AGREED DOSE AND INSTRUCTION CORRECT    CC:  follow-up visit.  History of Present Illness: Dr. Elmer Picker noted "blood on optic nerve"  @ routine checkup & performed MRI. F/U appt pending in 04/2010. PMH reviewed : Pituitary Adenoma diagnosed in 1991 by Dr Sandria Manly ; assessment by Dr Newell Coral. "He said no concern unless change in peripheral vision or numbness in L arm" . Neither have occurred.She had "migraine" with blurred vision X 5 min last month , resolved with Advil Migraine.  Allergies: 1)  ! Darvocet 2)  ! Combivent (Ipratropium-Albuterol) 3)  * Mirapex 4)  Sulfa 5)  Codeine 6)  * Lyrica  Past History:  Past Medical History: Hyperlipidemia reflux esophagitis palpitations dysmetabolic syndrome pain in joint Hypertension IBS restless leg syndrome neoplasm,benign of pituitary gland, Dr Newell Coral Scoliosis; Spinal Stenosis COPD  Past Surgical History: partial hysterectomy for dysfunctional bleeding 4 lumps removed from breast, all  benign 4 finger amputations , work injury gravid 2 para 2 esophageal stricture 01/2003 colonoscopy diverticulosis 01/2003 fracture left ankle KNEE SURGERY 08/2007; complicated by difficult resuscitation post anesthesia Pituitary Adenoma, 1991, Dr Avie Echevaria & Dr Newell Coral  Review of Systems General:  Denies chills, fever, and sweats; Weight loss through W Ws. Eyes:  Denies blurring, double vision, and vision loss-both eyes. ENT:  Denies decreased hearing and ringing in ears. CV:  Denies chest pain or discomfort, leg cramps with exertion, lightheadness, and near fainting. Derm:  Denies poor wound  healing. Neuro:  Denies brief paralysis, disturbances in coordination, numbness, poor balance, sensation of room spinning, tingling, and weakness. Endo:  Denies excessive hunger, excessive thirst, excessive urination, and polyuria.  Physical Exam  General:  Appears younger than age,well-nourished,in no acute distress; alert,appropriate and cooperative throughout examination Eyes:  No corneal or conjunctival inflammation noted. EOMI. Perrla. FOV & Vision grossly normal.Minimal ptosis OD Mouth:  Oral mucosa and oropharynx without lesions or exudates. Upper plate & lower partial Lungs:  Normal respiratory effort, chest expands symmetrically. Lungs are clear to auscultation, no crackles or wheezes. Heart:  Normal rate and regular rhythm. S1 and S2 normal without gallop, murmur, click, rub or other extra sounds. Pulses:  R and L carotid,radial,dorsalis pedis and posterior tibial pulses are full and equal bilaterally. No femoral artery bruits Extremities:  No clubbing, cyanosis, edema. Post traumatic digit amputations RUE Neurologic:  alert & oriented X3, cranial nerves II-XII intact, strength normal in all extremities, sensation intact to light touch, gait normal, DTRs symmetrical and normal, finger-to-nose normal, heel-to-shin normal, and Romberg negative.   No pronator drift Skin:  Intact without suspicious lesions or rashes Cervical Nodes:  No lymphadenopathy noted Axillary Nodes:  No palpable lymphadenopathy Psych:  memory intact for recent and remote, normally interactive, and good eye contact.     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 1 by mouth once daily  Problem # 4:  DYSMETABOLIC SYNDROME X (ICD-277.7)  Complete Medication  List: 1)  Vytorin 10-40 Mg Tabs (Ezetimibe-simvastatin) .... 1/2 by mouth once daily**labs due now** 2)  Actonel 35 Mg Tabs (Risedronate sodium) .[name].. 1 by mouth qweek 3)  Metformin Hcl 500 Mg Tb24 (Metformin hcl) .[name].. 1 by mouth qd 4)  Diovan 160 Mg Tabs (Valsartan) .[name].. 1 by mouth once daily 5)  Spiriva Handihaler 18 Mcg Caps (Tiotropium bromide monohydrate) .... Once daily 6)  Onetouch Ultra Test Strp (Glucose blood) .... Check blood sugar's as directed 7)  Onetouch Lancets Misc (Lancets) .... Fbs m,w,f,sun; 2 hrs post b'fast tues, after lunch thurs, & 2 hrs after eve meal sat 8)  Gabapentin 100 Mg Caps (Gabapentin) .[name].. 1 q 8 hrs as needed for leg pain 9)  Clonazepam 0.5 Mg Tabs (Clonazepam) .[name].. 1-2 at bedtime as needed  Patient Instructions: 1)  Please schedule fasting labs; see Diagnoses for Codes: 2)  Hepatic; 3)  Lipid Panel ; 4)  TSH; 5)  CBC w/ Diff ; 6)  HbgA1C ; 7)  BMP . Please sign Release of Records for MRI report from [facility]

## 2010-09-09 NOTE — Progress Notes (Signed)
Summary: refill clonazepam  Phone Note Refill Request Message from:  Fax from Pharmacy on June 30, 2010 3:27 PM  Refills Requested: Medication #1:  CLONAZEPAM 0.5 MG TABS 1-2 at bedtime as needed [facility] [address] [facility] [phone] - ph [phone]  Next Appointment Scheduled: lab appt [phone] Initial call taken by: Okey Regal Spring,  June 30, 2010 3:28 PM    Prescriptions: CLONAZEPAM 0.5 MG TABS (CLONAZEPAM) 1-2 at bedtime as needed  #60 x 1   Entered by:   Shonna Chock CMA   Authorized by:   Marga Melnick MD   Signed by:   Shonna Chock CMA on 07/01/2010   Method used:   Printed then faxed to ...       [facility]  [phone]* (retail)       [address] [place], [place]  [address]       Ph: [phone] or [phone]       Fax: [phone]   RxID:   [id number]

## 2010-09-09 NOTE — Assessment & Plan Note (Signed)
Summary: sore throat/chills/cbs   Vital Signs:  Patient profile:   75 year old female Weight:      174.4 pounds Temp:     99.2 degrees F oral Pulse rate:   80 / minute Resp:     16 per minute BP sitting:   126 / 88  (left arm) Cuff size:   large  Vitals Entered By: Shonna Chock (January 14, 2010 1:58 PM) CC: Sore throat, headache,achy,swolen glands (left is worse), and chills since Sunday Comments REVIEWED MED LIST, PATIENT AGREED DOSE AND INSTRUCTION CORRECT    CC:  Sore throat, headache, achy, swolen glands (left is worse), and and chills since Sunday.  History of Present Illness: Onset 01/12/2010 as severe ST with earache& L temple headache. Fever & chills last night. Rx: OTC throat spray, Advil. Labs reviewed ; all @ goal. A1c 6.2%.  Allergies: 1)  ! Darvocet 2)  ! Combivent (Ipratropium-Albuterol) 3)  * Mirapex 4)  Sulfa 5)  Codeine 6)  * Lyrica  Review of Systems General:  Complains of sweats. ENT:  Denies ear discharge, nasal congestion, postnasal drainage, and sinus pressure; No frontal headache , facial pain or purulence. Resp:  Denies sputum productive; Minor cough. GI:  Denies diarrhea. GU:  Denies discharge, dysuria, and hematuria. Allergy:  Denies itching eyes and seasonal allergies.  Physical Exam  General:  well-nourished appears slightly uncomfortable but in no acute distress; appropriate and cooperative throughout examination Ears:  External ear exam shows no significant lesions or deformities.  Otoscopic examination reveals clear canals, tympanic membranes are  perforated  bilaterally without bulging, retraction, inflammation or discharge. Hearing is grossly normal bilaterally. Nose:  External nasal examination shows no deformity or inflammation. Nasal mucosa are pink and moist without lesions or exudates. Mouth:  Oral mucosa and oropharynx without lesions or exudates.  Dentures ; minld pharyngeal erythema.   Neck:  No deformities, masses but  L > r   tenderness noted. Lungs:  Normal respiratory effort, chest expands symmetrically. Lungs are clear to auscultation, no crackles or wheezes. Cervical Nodes:  No lymphadenopathy noted but tender as noted Axillary Nodes:  No palpable lymphadenopathy   Impression & Recommendations:  Problem # 1:  ACUTE LYMPHADENITIS (ICD-683)  L > R  Orders: Prescription Created Electronically [id number])  Her updated medication list for this problem includes:    Azithromycin 250 Mg Tabs (Azithromycin) .[name]... As per pack  Problem # 2:  FEVER UNSPECIFIED (ICD-780.60) Assessment: Unchanged  with chills  Orders: Prescription Created Electronically [phone])  Complete Medication List: 1)  Vytorin 10-40 Mg Tabs (Ezetimibe-simvastatin) .... 1/2 by mouth once daily**labs due now** 2)  Actonel 35 Mg Tabs (Risedronate sodium) .[name].. 1 by mouth qweek 3)  Metformin Hcl 500 Mg Tb24 (Metformin hcl) .[name].. 1 by mouth qd 4)  Diovan 160 Mg Tabs (Valsartan) .[name].. 1 by mouth once daily 5)  Spiriva Handihaler 18 Mcg Caps (Tiotropium bromide monohydrate) .... Once daily 6)  Onetouch Ultra Test Strp (Glucose blood) .... Check blood sugar's as directed 7)  Onetouch Lancets Misc (Lancets) .... Fbs m,w,f,sun; 2 hrs post b'fast tues, after lunch thurs, & 2 hrs after eve meal sat 8)  Gabapentin 100 Mg Caps (Gabapentin) .[name].. 1 q 8 hrs as needed for leg pain 9)  Clonazepam 0.5 Mg Tabs (Clonazepam) .[name].. 1-2 at bedtime as needed 10)  Azithromycin 250 Mg Tabs (Azithromycin) .... As per pack  Other Orders: Rapid Strep (14782)  Patient Instructions: 1)  Zicam as needed for sore throat .  Drink as much fluid as you can tolerate for the next few days. Consume LESS THAN 40 grams of High Fructose Corn Syrup "sugar". / day Please schedule a follow-up appointment in 6 months. 2)  HbgA1C prior to visit, ICD-9:790.29 Prescriptions: AZITHROMYCIN 250 MG TABS (AZITHROMYCIN) as per pack  #1 x 0   Entered and Authorized by:   Marga Melnick MD    Signed by:   Marga Melnick MD on 01/14/2010   Method used:   Faxed to ...       [facility]  [phone]* (retail)       [address] [place], [place]  [address]       Ph: [phone] or [phone]       Fax: [phone]   RxID:   [id number]   Laboratory Results    Other Tests  Rapid Strep: negative

## 2010-09-09 NOTE — Letter (Signed)
Summary: Colonoscopy Date Change Letter  [facility]  [address] [place], [place] [address]   Phone: [phone]  Fax: [phone]      Dec 20, 2009 MRN: [id number]   C[facility] [address] [place], [place]  [address]   Dear [name],   Previously you were recommended to have a repeat colonoscopy around this time. Your chart was recently reviewed by Dr. Judie Petit T. Russella Dar of [facility]. Follow up colonoscopy is now recommended in June 2014. This revised recommendation is based on current, nationally recognized guidelines for colorectal cancer screening and polyp surveillance. These guidelines are endorsed by the American Cancer Society, [employer] on Colorectal Cancer as well as numerous other major medical organizations.  Please understand that our recommendation assumes that you do not have any new symptoms such as bleeding, a change in bowel habits, anemia, or significant abdominal discomfort. If you do have any concerning GI symptoms or want to discuss the guideline recommendations, please call to arrange an office visit at your earliest convenience. Otherwise we will keep you in our reminder system and contact you 1-2 months prior to the date listed above to schedule your next colonoscopy.  Thank you,  Judie Petit T. Russella Dar, M.D.  [facility] [phone]

## 2010-09-10 ENCOUNTER — Other Ambulatory Visit: Payer: Self-pay | Admitting: Dermatology

## 2010-09-11 NOTE — Assessment & Plan Note (Signed)
Summary: followup--to discuss 12/28 labs///sph   Vital Signs:  Patient profile:   74 year old female Weight:      180.2 pounds Pulse rate:   72 / minute Resp:     15 per minute BP sitting:   138 / 90  (left arm) Cuff size:   large  Vitals Entered By: Shonna Chock CMA (August 13, 2010 9:23 AM) CC: Follow-up visit: discuss labs ( copy given) , Type 2 diabetes mellitus follow-up, COPD follow-up   CC:  Follow-up visit: discuss labs ( copy given) , Type 2 diabetes mellitus follow-up, and COPD follow-up.  History of Present Illness:    Labs reviewed : vitamin D level 24. She does not take vit D thinking it constipated her.      This is a 75 year old woman who  also presents for Type 2 diabetes mellitus follow-up.  The patient reports weight gain of 10#  and numbness of  legs in am (resolves with ambulation), but denies polyuria, polydipsia, blurred vision, and self managed hypoglycemia.  The patient denies the following symptoms: neuropathic pain, chest pain, orthostatic symptoms, poor wound healing, vision loss, and foot ulcer.  Since the last visit the patient reports poor dietary compliance over the holidays, exercising  60 min daily regularly except over holidays, and not monitoring blood glucose.  Since the last visit, the patient reports having had eye care by an ophthalmologist.        The patient also presents for progression  COPD  symptoms.  The patient reports cough and exercise induced symptoms, but denies chest tightness, wheezing, increased sputum, and nocturnal awakening.  Medication use includes controller med (Spiriva)  intermittently.  Symbicort "made me jittery".  Current Medications (verified): 1)  Metformin Hcl 500 Mg  Tb24 (Metformin Hcl) .[name].. 1 By Mouth Qd 2)  Diovan 160 Mg  Tabs (Valsartan) .[name].. 1 By Mouth Once Daily 3)  Spiriva Handihaler 18 Mcg Caps (Tiotropium Bromide Monohydrate) .... Once Daily 4)  Onetouch Ultra Test  Strp (Glucose Blood) .... Check Blood Sugar's  As Directed 5)  Onetouch Lancets  Misc (Lancets) .... Fbs M,w,f,sun; 2 Hrs Post B'fast Tues, After Lunch Thurs, & 2 Hrs After Eve Meal Sat 6)  Gabapentin 100 Mg Caps (Gabapentin) .[name].. 1 Q 8 Hrs As Needed For Leg Pain 7)  Clonazepam 0.5 Mg Tabs (Clonazepam) .[name].. 1-2 At Bedtime As Needed  Allergies: 1)  ! Darvocet 2)  ! Combivent (Ipratropium-Albuterol) 3)  ! Simvastatin (Simvastatin) 4)  * Mirapex 5)  Sulfa 6)  Codeine 7)  * Lyrica  Physical Exam  General:  well-nourished,in no acute distress; alert,appropriate and cooperative throughout examination Lungs:  Normal respiratory effort, chest expands symmetrically. Lungs are clear to auscultation;minimal crackles  @ bases Heart:  normal rate, regular rhythm, no gallop, no rub, no JVD, no HJR, and grade 1 /6 systolic murmur.   Abdomen:  Bowel sounds positive,abdomen soft and non-tender without masses, organomegaly or hernias noted. No AAA or bruits Pulses:  R and L carotid,radial,dorsalis pedis and posterior tibial pulses are full and equal bilaterally Extremities:  No clubbing  cyanosis, edema.  Neurologic:  alert & oriented X3 and sensation intact to light touch.   Skin:  Intact without suspicious lesions or rashes Psych:  memory intact for recent and remote, normally interactive, and good eye contact.     Impression & Recommendations:  Problem # 1:  VITAMIN D DEFICIENCY (ICD-268.9)  Problem # 2:  DIABETES MELLITUS, TYPE II, CONTROLLED (  ICD-250.00)  Her updated medication list for this problem includes:    Metformin Hcl 500 Mg Tb24 (Metformin hcl) .[name]... 1 by mouth qd    Diovan 160 Mg Tabs (Valsartan) .[name]... 1 by mouth once daily  Problem # 3:  CHRONIC OBSTRUCTIVE PULMONARY DISEASE (ICD-496)  Her updated medication list for this problem includes:    Spiriva Handihaler 18 Mcg Caps (Tiotropium bromide monohydrate) ..... Once daily    Advair Diskus 100-50 Mcg/dose Aepb (Fluticasone-salmeterol) .[name]... 1 inhalation every 12 hrs ; gargle  & spit after use  Complete Medication List: 1)  Metformin Hcl 500 Mg Tb24 (Metformin hcl) .[name].. 1 by mouth qd 2)  Diovan 160 Mg Tabs (Valsartan) .[name].. 1 by mouth once daily 3)  Spiriva Handihaler 18 Mcg Caps (Tiotropium bromide monohydrate) .... Once daily 4)  Onetouch Ultra Test Strp (Glucose blood) .... Check blood sugar's as directed 5)  Onetouch Lancets Misc (Lancets) .... Fbs m,w,f,sun; 2 hrs post b'fast tues, after lunch thurs, & 2 hrs after eve meal sat 6)  Gabapentin 100 Mg Caps (Gabapentin) .[name].. 1 q 8 hrs as needed for leg pain 7)  Clonazepam 0.5 Mg Tabs (Clonazepam) .[name].. 1-2 at bedtime as needed 8)  Advair Diskus 100-50 Mcg/dose Aepb (Fluticasone-salmeterol) .[name].. 1 inhalation every 12 hrs ; gargle & spit after use  Patient Instructions: 1)  Use Spiriva once daily . Vitamin D3 1000 International Units TWO pills once daily.  2)  Take calcium  600 mg two times a day .  3)  Please schedule a follow-up appointment in 6 months.Vit D level pre appt  ( 268.9). 4)  Hepatic Panel prior to visit, ICD-9:995.20 5)  Lipid Panel prior to visit, ICD-9:272.4 6)  HbgA1C prior to visit, ICD-9:250.00 Prescriptions: ADVAIR DISKUS 100-50 MCG/DOSE AEPB (FLUTICASONE-SALMETEROL) 1 inhalation every 12 hrs ; gargle & spit after use  #1 x 0   Entered and Authorized by:   Marga Melnick MD   Signed by:   Marga Melnick MD on 08/13/2010   Method used:   Samples Given   RxID:   [id number]    Orders Added: 1)  Est. Patient Level IV [[address]]

## 2010-09-11 NOTE — Progress Notes (Signed)
Summary: lmom--need labs and appt w/[facility]; lmom  12/22--called back  Phone Note Call from Patient   Summary of Call: Per Dr Jena Gauss called patient to set up a lab for ----A1C, urine microalbumin, vitamin D level, and CPK     250.00, 995.20, 729.1, 733.90;      then schedule appointment with Dr Alwyn Ren for one week after labs are drawn Initial call taken by: Jerolyn Shin,  July 31, 2010 4:13 PM  Follow-up for Phone Call        Patient returned call--will have labs on 12/28 and see Dr Alwyn Ren on 08/13/2010 Follow-up by: Jerolyn Shin,  August 01, 2010 9:34 AM

## 2010-09-17 NOTE — Progress Notes (Signed)
Summary: advair refill  Phone Note Refill Request Call back at Home Phone [phone] Message from:  Patient on September 08, 2010 12:25 PM  Refills Requested: Medication #1:  ADVAIR DISKUS 100-50 MCG/DOSE AEPB 1 inhalation every 12 hrs ; gargle & spit after use. [facility] [address]  Next Appointment Scheduled: lab = 4/2 and 6/29, hopp = 7/9 Initial call taken by: Jerolyn Shin,  September 08, 2010 12:26 PM    Prescriptions: ADVAIR DISKUS 100-50 MCG/DOSE AEPB (FLUTICASONE-SALMETEROL) 1 inhalation every 12 hrs ; gargle & spit after use  #1 x 3   Entered by:   Shonna Chock CMA   Authorized by:   Marga Melnick MD   Signed by:   Shonna Chock CMA on 09/08/2010   Method used:   Electronically to        [facility]  [phone]* (retail)       [address] [place], [place]  [address]       Ph: [phone] or [phone]       Fax: [phone]   RxID:   [id number]

## 2010-11-10 ENCOUNTER — Other Ambulatory Visit: Payer: Medicare Other

## 2010-11-24 LAB — GLUCOSE, CAPILLARY
Glucose-Capillary: 102 mg/dL — ABNORMAL HIGH (ref 70–99)
Glucose-Capillary: 107 mg/dL — ABNORMAL HIGH (ref 70–99)
Glucose-Capillary: 112 mg/dL — ABNORMAL HIGH (ref 70–99)
Glucose-Capillary: 113 mg/dL — ABNORMAL HIGH (ref 70–99)
Glucose-Capillary: 116 mg/dL — ABNORMAL HIGH (ref 70–99)
Glucose-Capillary: 119 mg/dL — ABNORMAL HIGH (ref 70–99)
Glucose-Capillary: 119 mg/dL — ABNORMAL HIGH (ref 70–99)
Glucose-Capillary: 124 mg/dL — ABNORMAL HIGH (ref 70–99)
Glucose-Capillary: 129 mg/dL — ABNORMAL HIGH (ref 70–99)
Glucose-Capillary: 144 mg/dL — ABNORMAL HIGH (ref 70–99)
Glucose-Capillary: 98 mg/dL (ref 70–99)

## 2010-11-24 LAB — HEMOGLOBIN AND HEMATOCRIT, BLOOD
HCT: 24.7 % — ABNORMAL LOW (ref 36.0–46.0)
HCT: 25.6 % — ABNORMAL LOW (ref 36.0–46.0)
HCT: 27.7 % — ABNORMAL LOW (ref 36.0–46.0)
HCT: 31.1 % — ABNORMAL LOW (ref 36.0–46.0)
Hemoglobin: 12.4 g/dL (ref 12.0–15.0)
Hemoglobin: 8.4 g/dL — ABNORMAL LOW (ref 12.0–15.0)
Hemoglobin: 9.3 g/dL — ABNORMAL LOW (ref 12.0–15.0)

## 2010-11-24 LAB — DIFFERENTIAL
Eosinophils Relative: 3 % (ref 0–5)
Lymphocytes Relative: 22 % (ref 12–46)
Lymphs Abs: 1.1 10*3/uL (ref 0.7–4.0)
Monocytes Absolute: 0.4 10*3/uL (ref 0.1–1.0)
Neutro Abs: 3.4 10*3/uL (ref 1.7–7.7)

## 2010-11-24 LAB — CBC
HCT: 40.8 % (ref 36.0–46.0)
Hemoglobin: 13.6 g/dL (ref 12.0–15.0)
RBC: 4.32 MIL/uL (ref 3.87–5.11)

## 2010-11-24 LAB — COMPREHENSIVE METABOLIC PANEL
BUN: 13 mg/dL (ref 6–23)
CO2: 28 mEq/L (ref 19–32)
Chloride: 104 mEq/L (ref 96–112)
Glucose, Bld: 116 mg/dL — ABNORMAL HIGH (ref 70–99)
Potassium: 3.9 mEq/L (ref 3.5–5.1)
Sodium: 142 mEq/L (ref 135–145)
Total Protein: 6.5 g/dL (ref 6.0–8.3)

## 2010-11-24 LAB — TYPE AND SCREEN
ABO/RH(D): O POS
Antibody Screen: NEGATIVE

## 2010-11-24 LAB — URINALYSIS, ROUTINE W REFLEX MICROSCOPIC
Hgb urine dipstick: NEGATIVE
Ketones, ur: NEGATIVE mg/dL
Protein, ur: NEGATIVE mg/dL
Urobilinogen, UA: 0.2 mg/dL (ref 0.0–1.0)

## 2010-11-24 LAB — PROTIME-INR
INR: 1.1 (ref 0.00–1.49)
INR: 1.4 (ref 0.00–1.49)
INR: 2.2 — ABNORMAL HIGH (ref 0.00–1.49)
Prothrombin Time: 12.8 seconds (ref 11.6–15.2)
Prothrombin Time: 14.5 seconds (ref 11.6–15.2)
Prothrombin Time: 18.2 seconds — ABNORMAL HIGH (ref 11.6–15.2)
Prothrombin Time: 19 seconds — ABNORMAL HIGH (ref 11.6–15.2)
Prothrombin Time: 20.8 seconds — ABNORMAL HIGH (ref 11.6–15.2)

## 2010-11-24 LAB — APTT: aPTT: 28 seconds (ref 24–37)

## 2010-11-25 ENCOUNTER — Encounter: Payer: Self-pay | Admitting: Internal Medicine

## 2010-11-25 LAB — URINALYSIS, ROUTINE W REFLEX MICROSCOPIC
Bilirubin Urine: NEGATIVE
Glucose, UA: NEGATIVE mg/dL
Hgb urine dipstick: NEGATIVE
Protein, ur: 30 mg/dL — AB

## 2010-11-25 LAB — DIFFERENTIAL
Eosinophils Absolute: 0.3 10*3/uL (ref 0.0–0.7)
Eosinophils Relative: 6 % — ABNORMAL HIGH (ref 0–5)
Lymphocytes Relative: 28 % (ref 12–46)
Lymphs Abs: 1.3 10*3/uL (ref 0.7–4.0)
Monocytes Absolute: 0.4 10*3/uL (ref 0.1–1.0)

## 2010-11-25 LAB — POCT I-STAT, CHEM 8
BUN: 10 mg/dL (ref 6–23)
Calcium, Ion: 1.2 mmol/L (ref 1.12–1.32)
Creatinine, Ser: 0.6 mg/dL (ref 0.4–1.2)
Glucose, Bld: 104 mg/dL — ABNORMAL HIGH (ref 70–99)
Hemoglobin: 13.6 g/dL (ref 12.0–15.0)
TCO2: 24 mmol/L (ref 0–100)

## 2010-11-25 LAB — URINE MICROSCOPIC-ADD ON

## 2010-11-25 LAB — CBC
HCT: 36.9 % (ref 36.0–46.0)
Hemoglobin: 12.6 g/dL (ref 12.0–15.0)
MCV: 92.1 fL (ref 78.0–100.0)
WBC: 4.8 10*3/uL (ref 4.0–10.5)

## 2010-11-25 LAB — GLUCOSE, CAPILLARY

## 2010-12-03 ENCOUNTER — Encounter: Payer: Self-pay | Admitting: Internal Medicine

## 2010-12-03 ENCOUNTER — Ambulatory Visit (INDEPENDENT_AMBULATORY_CARE_PROVIDER_SITE_OTHER): Payer: Medicare Other | Admitting: Internal Medicine

## 2010-12-03 DIAGNOSIS — R079 Chest pain, unspecified: Secondary | ICD-10-CM

## 2010-12-03 DIAGNOSIS — Z136 Encounter for screening for cardiovascular disorders: Secondary | ICD-10-CM

## 2010-12-03 DIAGNOSIS — I10 Essential (primary) hypertension: Secondary | ICD-10-CM

## 2010-12-03 DIAGNOSIS — Z8719 Personal history of other diseases of the digestive system: Secondary | ICD-10-CM

## 2010-12-03 DIAGNOSIS — E782 Mixed hyperlipidemia: Secondary | ICD-10-CM

## 2010-12-03 LAB — CBC WITH DIFFERENTIAL/PLATELET
Basophils Absolute: 0.1 10*3/uL (ref 0.0–0.1)
HCT: 44.7 % (ref 36.0–46.0)
Lymphs Abs: 1.2 10*3/uL (ref 0.7–4.0)
Monocytes Absolute: 0.5 10*3/uL (ref 0.1–1.0)
Monocytes Relative: 9.1 % (ref 3.0–12.0)
Neutrophils Relative %: 67 % (ref 43.0–77.0)
Platelets: 238 10*3/uL (ref 150.0–400.0)
RDW: 13.4 % (ref 11.5–14.6)

## 2010-12-03 LAB — TROPONIN I: Troponin I: <0.01 ng/mL (ref <0.06)

## 2010-12-03 LAB — CK TOTAL AND CKMB (NOT AT ARMC)
CK, MB: 1.2 ng/mL (ref 0.3–4.0)
Total CK: 51 U/L (ref 7–177)

## 2010-12-03 NOTE — Progress Notes (Signed)
Subjective:    Patient ID: Cheryl Brown, female    DOB: 03/22/1936, 75 y.o.   MRN: [id number]  HPI Appt was to review [facility] panel; but she has been having intermittent chest pain/ epigastric  since 04/24. CHEST PAIN Location: very low SS /epigastric Quality: sharp    Duration: 15-20 min Onset (rest, exertion): onset in back between scapulae 4/24 Radiation: in back yesterday 2-3 hrs ; this am SS after awakening, separate presentations of pain w/o true radiation  From 1 area to other  Better with: Prilosec OTC usually helps this type of pain ; not taken this am ; standing up helps   Worse with: no definite trigger History of Trauma/lifting: no  Nausea/vomiting: no  Diaphoresis: no  Shortness of breath: no  Pleuritic: no  Cough: no, unrelated  Edema: no  PND: no  Dizziness: no  Palpitations: no  Syncope: no  Indigestion: yes, recurrent  Red Flags Worse with exertion: no  Recent Immobility: no  Cancer history: no  Tearing/radiation to back: no, it initially was in back a   #1)s noted  #2) Dyslipidemia assessment: Lab results  review: [facility] Panel: Lp(a) 56; CRP 2.5; insulin 16.A1c 6.1% on Metformin 500 mg daily.   Family history of premature CAD/ MI: no  ; Nutrition: no diet ;   Exercise: none for 3 weeks ;  Smoking : quit 1982 ;  HTN: controlled @ home  ;   Weight :up 10#      ;          ROS: fatigue: yes ;  abd pain/bowel changes: some constipation ; myalgias:yes,leg pain  better with Clonazepam;  memory loss: no;skin changes: no   Review of Systems see above      Objective:   Physical Exam Gen.: Healthy and well-nourished in appearance;in no distress. Calm, appropriate and cooperative throughout exam. Eyes: No corneal or conjunctival inflammation noted. Pupils equal round reactive to light . No icterus, Mouth: Oral mucosa and oropharynx reveal no lesions or exudates. Upper plate ; lower partial. Mild erythema of pharynx Neck: No deformities, masses, or tenderness  noted.. Thyroid  Not enlarged; no nodules. Lungs: Normal respiratory effort; chest expands symmetrically. Lungs are clear to auscultation without rales, wheezes, or increased work of breathing. No rub present. No tenderness to palpation of sternum Heart: Normal rate and rhythm. Normal S1 and S2. No gallop, click, or rub. S4; no murmur. Abdomen: Bowel sounds normal; abdomen soft and  Slightly tender epigastric area. No masses, organomegaly or hernias noted.  No clubbing, cyanosis, edema. Amputations RUE.noted.Neg Homan's.Vascular: Carotid, radial artery, dorsalis pedis and dorsalis posterior tibial pulses are full and equal. No bruits present. Neurologic: Alert and oriented x3. Deep tendon reflexes symmetrical and normal.         Skin: Intact without suspicious lesions or rashes.No jaundice/ warm & dry Lymph: No cervical, axillary, or inguinal lymphadenopathy present. Psych: Mood and affect are normal. Normally interactive . Not anxious about the  pain                                                                                      Assessment &  Plan:  #1 epigastric pain with tenderness to palpation & normal EKG.   PMH of reflux esophagitis.                      #2 dyslipidemia with elevated LDL  ,CRP, & Lp(a). Statin intolerance

## 2010-12-03 NOTE — Patient Instructions (Addendum)
Please complete stool cards.The triggers for dyspepsia or "heart burn"  include stress; the "aspirin family" ; alcohol; peppermint; and caffeine (coffee, tea, cola, and chocolate). The aspirin family would include aspirin and the nonsteroidal agents such as ibuprofen &  Naproxen. Tylenol would not cause reflux. If having dyspepsia ; food & dink should be avoided for @ least 2 hors before going to bed. Prilosec OTC twice a day before meals. Review information on Niaspan in reference to treating the Lp(a); this is the only option to treat this genetic risk.

## 2010-12-08 ENCOUNTER — Telehealth: Payer: Self-pay | Admitting: Internal Medicine

## 2010-12-08 ENCOUNTER — Telehealth: Payer: Self-pay | Admitting: *Deleted

## 2010-12-08 NOTE — Telephone Encounter (Signed)
Spoke w/ pt aware of recommendations. 

## 2010-12-08 NOTE — Telephone Encounter (Signed)
Dr.Hopper please advise on Niaspan dose, instruction and dispense number

## 2010-12-08 NOTE — Telephone Encounter (Signed)
I recommend she try the Niaspan with the applesauce as directed using samples before refill life full prescription.

## 2010-12-08 NOTE — Telephone Encounter (Signed)
Spoke w/ pt says that Niaspan is causing terrible flushing even w/ taking aspirin prior. Says she is really red like and feels like she has sunburn. Would like to know what else she should do.

## 2010-12-08 NOTE — Telephone Encounter (Signed)
Please take the Niaspan with  a cup of applesauce. Take the pill halfway through the applesauce supplement. The pectin  in the applesauce should  modify the flushing. Also please stop the metformin as your  A1c is so low. Recheck the A1c with urine microalbumin in  4 months after stopping the metformin.(250.00)

## 2010-12-08 NOTE — Telephone Encounter (Signed)
Patient says she was given samples of Niaspan, but now needs prescription called into [facility], [place], [facility]

## 2010-12-15 ENCOUNTER — Other Ambulatory Visit: Payer: Medicare Other

## 2010-12-15 DIAGNOSIS — Z1211 Encounter for screening for malignant neoplasm of colon: Secondary | ICD-10-CM

## 2010-12-23 NOTE — Op Note (Signed)
NAME:  Cheryl Brown, Cheryl Brown             ACCOUNT NO.:  [id number]   MEDICAL RECORD NO.:  [id number]          PATIENT TYPE:  INP   LOCATION:  0001                         FACILITY:  [facility]   PHYSICIAN:  Georges Lynch. Gioffre, M.D.DATE OF BIRTH:  03-03-1936   DATE OF PROCEDURE:  08/29/2008  DATE OF DISCHARGE:                               OPERATIVE REPORT   SURGEON:  Dr. Darrelyn Hillock   ASSISTANT:  Arlyn Leak, P.A.   PREOPERATIVE DIAGNOSES:  1. Severe degenerative arthritis, right knee.  2. Diabetes mellitus.   POSTOPERATIVE DIAGNOSES:  1. Severe degenerative arthritis, right knee.  2. Diabetes mellitus.   OPERATION:  1. Right total knee arthroplasty utilizing the DePuy system.      Vancomycin was used in the cement.  We used 1 g of vancomycin      total.  Sizes of the prosthesis:  The patella with a size 35 with 3      pegs.  The femur was a size 3 right posterior stabilized knee      prosthesis.  The tibial tray was a size 2.5, and the insert was a      size 3, thickness 12.5 mm, rotating platform type.   PROCEDURE:  Under spinal anesthesia, routine orthopedic prepping and  draping of the right lower extremity was carried out.  At this time, the  leg was exsanguinated with Esmarch.  Tourniquet was elevated at 375  mmHg.  The knee was flexed.  An incision was made over the anterior  aspect of the right knee.  Bleeders identified and cauterized.  Two  flaps were created.  I then carried out a median parapatellar incision.  I reflected the patella laterally, flexed the knee, and did medial and  lateral meniscectomy.  Also excised the anterior posterior cruciate  ligament.  At this time, an initial drill hole was made in the  intercondylar notch.  A drill hole was made in the intercondylar notch.  A #1 jig was inserted.  We removed 11-mm thickness off the distal femur.  Following that, the next jig was applied for measurement purposes.  We  measured the femur to be a size 3 right.  The next jig  was inserted, and  we then carried out anterior, posterior and chamfer cuts in the usual  fashion for a size 3 right femur.  Following that, we then made our  initial drill hole in the tibial plateau, and we removed 4-mm thickness  off the medial affected side in usual fashion, and we had a nice clean  cut across the plateau.  We measured the plateau's size to be a size  2.5.  We then cut our keel cut in the tibial plateau.  Following that,  we utilized our cuts for the notch cut for the femur.  We inspected the  posterior condyles after inserting the lamina spreaders.  There were no  spurs present.  We then went used our measuring devices for our  flexion/extension stability.  Following that, we cleaned out the knee  and inserted our trial components.  We finally selected a 12.5-mm  thickness tibial insert.  We then cut our patella in the usual fashion.  We did a resurfacing procedure for a size 35 patella.  Three drill holes  were made in the articular surface of the patella.  We then removed all  trial components, thoroughly water picked out the knee, dried the knee  out, cemented all 3 components in simultaneously with 1 g of vancomycin  in the cement.  After this was done and the cement was hardened, we then  removed the trial insert, searched for cement, removed loose pieces of  cement.  We water picked out the knee, and following that, we went ahead  and dried the knee out and inserted our permanent rotating platform  insert which measured a size 3 but a 12.5-mm thickness.  We reduced the  knee, had  excellent function and excellent stability and in good motion.  I then  inserted my Hemovac drain, closed the knee in layers in the usual  fashion.  The skin was closed with metal staples.  Sterile Neosporin  dressing was applied.  The patient left the operative room in  satisfactory condition.           ______________________________  Georges Lynch Darrelyn Hillock, M.D.     RAG/MEDQ  D:   08/29/2008  T:  08/29/2008  Job:  [id number]   cc:   Windy Fast A. Darrelyn Hillock, M.D.  Fax: [phone]   Titus Dubin. Alwyn Ren, MD,FACP,FCCP  [phone] [place]  [place] [address]

## 2010-12-23 NOTE — Discharge Summary (Signed)
NAME:  Cheryl Brown, Cheryl Brown             ACCOUNT NO.:  [id number]   MEDICAL RECORD NO.:  [id number]          PATIENT TYPE:  INP   LOCATION:  1615                         [facility]ACILITY:  [facility]   PHYSICIAN:  Georges Lynch. Gioffre, M.D.DATE OF BIRTH:  11-06-1935   DATE OF ADMISSION:  08/29/2008  DATE OF DISCHARGE:  09/03/2008                               DISCHARGE SUMMARY   DISPOSITION:  Home.   ADMISSION DIAGNOSES:  1. Severe osteoarthritis right knee.  2. Hypertension.  3. Diabetes.  4. Intermittent palpitations.  5. Asthma.  6. Restless leg syndrome.  7. History of left lower extremity deep vein thrombosis.  8. History of hiatal hernia.  9. History of reflux disease.  10.History of hemorrhoids.  11.History of urinary incontinence.  12.History of pituitary gland tumor 20 years previous.  13.Multiple drug reactions.   DISCHARGE DIAGNOSES:  1. Right total knee arthroplasty.  2. Asymptomatic postoperative blood loss anemia, was allowed self      correct without blood transfusion.  3. Postoperative chronic wound, bloody drainage, placed on oral      antibiotics prophylactically.  4. Hypertension.  5. Diabetes.  6. Intermittent complications.  7. Asthma.  8. Restless leg syndrome.  9. History of lower extremity DVT.  10.History of hiatal hernia.  11.History of reflux disease.  12.History of hemorrhoids.  13.History urinary incontinence.  14.History of pituitary gland tumor 20 years previous.  15.Multiple drug reactions.   HISTORY OF PRESENT ILLNESS:  The patient is a 75 year old female long-  term patient Dr. Darrelyn Hillock uses been treated for arthritic knee.  She  underwent left total knee arthroplasty one year previous.  Did well.  The patient continues have problems with her right knee.  She has pain  with range of motion and ambulation.  X-rays reveal she has advance  arthritic changes in the knee.  The patient has elected to proceed with  a right total knee arthroplasty.   ALLERGIES:  SULFA, CODEINE, MIRAPEX, LYRICA   MEDICATIONS:  1. Vytorin 10/40 one half tablet a day.  2. Actonel 35 mg once a week.  3. Metformin 500 mg once a day.  4. Omeprazole 20 mg once a day.  5. Diovan 160 mg once a day.  6. Spiriva multidose inhaler once a day.  7. Vagifem twice a week.   SURGICAL PROCEDURES:  On August 29, 2008, the patient was taken to the  OR by Dr. Worthy Rancher, assisted by Oneida Alar PA-C.  Under general  anesthesia, the patient underwent a right total knee arthroplasty  without any complications.  The patient had the following components  implanted:  A size 3 right femoral component, a size 2.5 keeled tibial  tray, a size 35 mm three peg patella, a size 3 12.5 thickness  polyethylene bearing.  All components were implanted with polymethyl  methacrylate with vancomycin mixed in.   CONSULTATIONS:  The following routine consults requested:  Physical  therapy, occupational therapy, case management, pharmacy for Coumadin  monitoring.   HOSPITAL COURSE:  On August 29, 2008, the patient was admitted to  [facility] under the care of Dr. Ferne Reus  Gioffre.  The patient was  taken to the OR where right total knee arthroplasty was performed  without any complications.  The patient was transferred recovery room  and then to orthopedic floor in good condition on a total knee protocol  with IV antibiotics, pain medicines and Coumadin for DVT prophylaxis.  The patient then spent 5 days postoperative course without any  significant untoward events.  She was able to wean off her IV  medications well without any issues.  The patient's vital signs remained  stable.  She remained afebrile.  The patient's wound looked good  throughout the course her hospitalization, but she had this chronic  serosanguineous bloody discharged from the wound, so Dr. Darrelyn Hillock placed  her on Keflex orally postoperatively just as prophylactic for any  infections.  The patient did have a  various slight drop in her blood  pressures, so her blood pressure medicines were held and she had no  other issues.  Patient worked progressively well with physical therapy.  She was able to ambulate in excess of 60 feet with therapy.  She was  able to follow instructions to do her exercises well, so on postop day  #5 she was orthopedically medically stable ready for discharge home.  She was discharged in good condition with outpatient home health  physical therapy and Coumadin monitoring.   LABORATORY DATA:  CBC on admission found WBCs 5.2, hemoglobin 13.6,  hematocrit 40.8, platelets 219.  On discharge, her H&H was 8.7 and 25.6.  She tolerated it well with activities without any issues and she was  allowed to self correct with p.o. supplements.  INR on discharge was  2.2.  Routine chemistries on admission within normal limits with  exception of glucose of 116.  Estimated GFR was greater than 60.  Urinalysis on admission was normal.  Finger stick glucose levels daily  four times a day ranged from a low of 99 to a high of 134.  Chest x-ray  on admission showed no evidence of active chest disease, improved in  aeration of the lung bases on January 19.  This was compared with a  previous chest x-ray done on August 18, 2007.  Postop knee film shows  total knee arthroplasty anatomically aligned.   DISCHARGE INSTRUCTIONS:  1. Diet.  An 1800 calories ADA diet.  2. Activity.  She is to ambulate with the use walker her instructions      with therapy.  3. Wound care.  She is change dressing daily.   FOLLOW UP:  She needs follow-up appoint with Dr. Darrelyn Hillock 2 weeks from  date of discharge.  She is to call [phone] for that follow-up  appointment.   MEDICATIONS:  1. Coumadin 5 mg once a day unless changed by the Home Health      pharmacist.  2. Dilaudid 2 mg one tablet every 4-6 hours for pain if needed.  3. Keflex 500 mg one tablet four times a day until gone.  4. Robaxin 500 mg once every  hours for muscle spasms if needed.  5. Omeprazole 20 mg once a day.  6. Vytorin 10/40 one half tablet a day.  7. Metformin ER 500 mg once a day.  8. Diovan 160 mg once a day.  9. Actonel 35 mg once a day.  10.Darvocet on hold until done with Dilaudid.  11.Spiriva inhaler as needed.   CONDITION ON DISCHARGE:  The patient's condition upon discharge to home  is listed improved and good.  Jamelle Rushing, P.A.    ______________________________  Georges Lynch Darrelyn Hillock, M.D.    RWK/MEDQ  D:  09/26/2008  T:  09/26/2008  Job:  [id number]   cc:   Windy Fast A. Darrelyn Hillock, M.D.  Fax: [phone]

## 2010-12-23 NOTE — Op Note (Signed)
NAME:  NGINA, ROYER             ACCOUNT NO.:  [id number]   MEDICAL RECORD NO.:  [id number]          PATIENT TYPE:  INP   LOCATION:  0006                         [facility]AC[facility]   PHYSICIAN:  Georges Lynch. Gioffre, M.D.DATE OF BIRTH:  Mar 28, 1936   DATE OF PROCEDURE:  08/17/2007  DATE OF DISCHARGE:                               OPERATIVE REPORT   PREOPERATIVE DIAGNOSIS:  Severe degenerative arthritis involving the  left knee.   POSTOPERATIVE DIAGNOSIS:  Severe degenerative arthritis involving the  left knee.   OPERATION:  Left total knee arthroplasty utilizing the DePuy system.  All three components were cemented and vancomycin was used in the  cement.  The sizes used were as follows.  1. I utilized a size 3 left femur posterior cruciate sacrificing type      prosthesis.  2. I used a size 3 tibial tray with a 15 mm thickness rotating      platform insert.  3. The patella was a size 35 with three pegs.   DESCRIPTION OF PROCEDURE:  Under spinal anesthesia, routine orthopedic  prepping and draping of the left lower extremity was carried out.  She  had 2 grams of IV access.  First the leg was exsanguinated with an  Esmarch and a tourniquet was elevated at 375 mmHg.  A incision was made  over the anterior aspect of the left knee with the left knee flexed.  At  this time, two flaps were created.  I then carried out a median  parapatellar incision and the patella was reflected laterally, we flexed  the knee, did medial and lateral meniscectomies and excised the anterior  and posterior cruciate ligaments.  At this time, we did a synovectomy as  well.  Following that, initial drill hole was made in the intercondylar  notch.  The guide rod was inserted up the femoral canal.  Following  that, we then inserted the #1 jig and we removed 11 mm thickness off the  distal femur.  At that particular time, #2 jig was inserted for  measurement purposes.  We measured the femur to be a size 3.  The  third  jig was inserted for anterior, posterior and chamfer cuts.  Once the  femur was prepared, we then went down and prepared the tibia.  We made  the appropriate cut in our tibial plateau and removed the appropriate  amount of bone from the tibial plateau.  We did utilize the  intramedullary guide rod at that time.  Following that, we then cut our  keel cut out of the tibial plateau.  We measured tibial tray to be a  size 3.  Following that, we then went back and cut our notch cut out the  distal femur.  We then inserted our spacer blocks and checked the  ligament structures in flexion/extension and we selected a 15 mm  thickness insert for the tibia.  We then inserted all our trial  components and had good fixation and good stability with the 15 mm  thickness insert.  At that time, we then did a resurfacing type  procedure of the  patella.  We removed the appropriate amount of bone for  the patella and basically three drill holes were made in the patella  following that for a size 35 mm patella.  After all three components  were prepared, we then thoroughly removed those, thoroughly irrigated  out the knee and inserted Gelfoam into the femoral canal and down in the  tibial canal.  Following that, we then dried the knee out in the usual  fashion and cemented all three components in simultaneously.  As I  mentioned, vancomycin was used in the cement.  We then went through  range of motion of the knee and felt that we had good stability with the  15 mm thickness insert.  Note this was a rotating platform type insert.  Prior to inserting our permanent tibial insert, we then injected 30 mL  of 25% Marcaine with epinephrine and Toradol into the knee joint.  Following that, we then irrigated knee out again to make sure there were  no loose pieces of cement.  We then inserted our permanent size 3 15 mm  thickness tibial insert, rotating platform type reduced the knee, had  good stability.  We  then irrigated the knee out again with saline and  then antibiotic solution and then inserted our Hemovac drain and closed  the knee in layers in the usual fashion.  Sterile Neosporin dressings  were applied.  The patient left the operating room in satisfactory  position.   SURGEON:  Georges Lynch. Darrelyn Hillock, M.D.   OPERATIONS:  Jamelle Rushing, P.A.           ______________________________  Georges Lynch Darrelyn Hillock, M.D.     RAG/MEDQ  D:  08/17/2007  T:  08/17/2007  Job:  [id number]   cc:   Chart

## 2010-12-23 NOTE — H&P (Signed)
NAME:  Cheryl Brown, Cheryl Brown             ACCOUNT NO.:  [id number]   MEDICAL RECORD NO.:  [id number]          PATIENT TYPE:  INP   LOCATION:  0001                         FACILITY:  [facility]   PHYSICIAN:  Georges Lynch. Gioffre, M.D.DATE OF BIRTH:  1936/07/10   DATE OF ADMISSION:  08/29/2008  DATE OF DISCHARGE:                              HISTORY & PHYSICAL   CHIEF COMPLAINT:  Painful right knee.   HISTORY OF PRESENT ILLNESS:  Patient is a 75 year old female, well-known  patient of Dr. Darrelyn Hillock, treated for arthritic knees.  She has undergone  a left total knee arthroplasty 1 year previous, did very good.  She is  here to have a right total knee arthroplasty due to advanced arthritic  changes in the knee that have failed conservative treatment.   ALLERGIES:  1. SULFA.  2. CODEINE.  3. MIRAPEX.  4. LYRICA.   CURRENT MEDICATIONS:  1. Vytorin 10/40 one half tablet a day.  2. Actonel 35 mg 1 tablet a week.  3. Metformin 500 mg 1 tablet a day.  4. Omeprazole 20 mg once a day.  5. Diovan 160 mg once a day.  6. Spiriva multidose inhaler once daily.  7. Vagifem twice a week.   PAST SURGICAL HISTORY:  1. Multiple breast biopsies.  2. Right hand surgery for amputations, multiple fingers.  3. Left total hip arthroplasty.  4. Patient denies any problems other than nausea and vomiting with      previous surgical procedures.   PAST MEDICAL HISTORY:  Positive for:  1. Blood clots 30 years previous in her left lower extremity.  2. She has had multiple skin lesions in the past.   NEUROLOGIC:  She denies any other issues presently.  CARDIOVASCULAR:  She denies any chest pains, shortness of breath.  PULMONARY:  Unremarkable.   FAMILY MEDICAL HISTORY:  Father is deceased from prostate cancer.  Mother is deceased, complications of Alzheimer's, hip fracture, and  sepsis.   SOCIAL HISTORY:  Patient is widowed.  She is retired.  She has 2 grown  children.  Her daughter will help her provide care  after.   PRIMARY CARE PHYSICIAN:  Dr. Marga Melnick who has given her medical  clearance.   PHYSICAL EXAM:  Patient's height is 5 feet 2.  Weight is 170 pounds.  GENERAL:  This is a healthy-appearing, well-developed female, conscious,  alert, and appropriate.  HEAD:  Normocephalic.  Pupils equal, round, and reactive.  Gross hearing  is intact.  CHEST:  Lung sounds were clear and equal.  HEART:  Regular rate and rhythm.  ABDOMEN:  Soft, nontender.  UPPER EXTREMITIES:  Good range of motion.  LOWER EXTREMITIES:  Both hips had good range of motion without any  discomfort.  Left knee has a well-healed midline surgical incision.  She  is able to fully extend it.  She can flex it back to 110 degrees.  Right  knee, she has no swelling, erythema, and no signs of infection.  She  lacks about 5 degrees extension.  She is able to flex it back to about  110.  Ankles have good motion.  PERIPHERAL VASCULAR:  Carotid pulses 2+.  No bruits.  Radial pulses 2+.  Dorsalis pedis pulses.  NEURO:  Patient is conscious, alert, and appropriate.  No gross  neurologic defects.  BREAST EXAM:  Deferred at this time.  RECTAL EXAM:  Deferred at this time.  GU EXAM:  Deferred at this time.   IMPRESSION:  1. Severe osteoarthritis, right knee.  2. Recent left total knee arthroplasty.  3. Hypertension.  4. Diabetes.  5. Intermittent palpitations.  6. Asthma.  7. Restless leg syndrome.  8. History of left lower extremity clot.  9. History of multiple skin lesions.  10.History of hiatal hernia.  11.History of reflux disease.  12.History of hemorrhoids.  13.History of urinary incontinence.  14.History of pituitary gland tumor 20 years previous.  15.Multiple drug reactions.   PLAN:  Patient will undergo all routine labs and tests prior to having a  right total knee arthroplasty by Dr. Darrelyn Hillock at [facility] on  August 29, 2008.  Patient has been medically cleared by her primary  care  physician.      Jamelle Rushing, P.A.    ______________________________  Georges Lynch Darrelyn Hillock, M.D.    RWK/MEDQ  D:  08/29/2008  T:  08/29/2008  Job:  [id number]   cc:   Windy Fast A. Darrelyn Hillock, M.D.  Fax: [phone]

## 2010-12-23 NOTE — Discharge Summary (Signed)
NAME:  Cheryl Brown, Cheryl Brown             ACCOUNT NO.:  [id number]   MEDICAL RECORD NO.:  [id number]          PATIENT TYPE:  INP   LOCATION:  1612                         FACILITY:  [facility]   PHYSICIAN:  Georges Lynch. Gioffre, M.D.DATE OF BIRTH:  25-Feb-1936   DATE OF ADMISSION:  08/17/2007  DATE OF DISCHARGE:  08/22/2007                               DISCHARGE SUMMARY   ADMISSION DIAGNOSES:  1. Severe osteoarthritis with painful range of motion, weightbearing      left knee.  2. Hypertension.  3. Diabetes.  4. Intermittent palpitations.  5. History of asthma.  6. History of restless legs syndrome.  7. History of blood clots in the left lower extremity.  8. History of multiple skin lesions, removed.  9. History of hiatal hernia.  10.History of reflux disease.  11.Long history of hemorrhoids.   DISCHARGE DIAGNOSES:  1. Left total knee arthroplasty.  2. Postoperative blood loss anemia requiring 3 units of packed red      blood cells.  3. Postoperative shortness of breath with pulmonary embolus workup,      which was negative.  Workup incidentally found a thyroid nodule on      the right and an indeterminate low density in her spleen.  4. Hypertension.  5. Diabetes.  6. Intermittent palpitations.  7. Asthma.  8. Restless legs syndrome.  9. History of deep venous thrombosis and left lower extremity.  10.History of hiatal hernia.  11.History of reflux disease.   HISTORY OF PRESENT ILLNESS:  Patient is a 75 year old female with  significant arthritic changes in her left knee, per x-ray, and knee  arthroscopy.  Patient has continued to have painful range of motion.  She has failed conservative treatment.  She elected to proceed with a  total knee arthroplasty.   ALLERGIES:  SULFA, CODEINE, MIRAPEX, LYRICA.  Patient does have multiple  itchings with multiple other medications.   CURRENT MEDICATIONS ON ADMISSION:  1. Vytorin 10/40 1/2 tablet a day.  2. Nexium 40 mg a day.  3. Estring 2  mg ring.  4. Actonel 35 mg once daily.  5. Diovan 160 mg a day.  6. Metformin 500 mg once daily.  7. MagOx 1/2 tablet morning and p.m.  8. Combivent 2 puffs p.r.n.  9. Toprol XL 25 mg p.r.n. palpitations only.   SURGICAL PROCEDURE:  On August 17, 2007, patient was taken to the OR by  Dr. Ranee Gosselin, assisted by Oneida Alar, PA-C.  Under spinal  anesthesia, the patient underwent a left total knee arthroplasty.  There  were no complications.  There was minimal blood loss.  Patient tolerated  the procedure well.  Patient had the following components implanted:  A  size left 3 Sigma femoral component, a size 3 keeled tibial tray, a size  35 mm 3 peg patella, a size 3 15 mm Polygen component.  All components  were polymethylmethacrylate with vancomycin mixed in.   CONSULTS:  1. The following routine consults were requested:  Physical therapy,      case management, pharmacy.  2. A primary care [place] hospitalist consult was also requested for  evaluation of the patient's medical issues and the patient's acute      onset of shortness of breath.   HOSPITAL COURSE:  On August 17, 2007, the patient was admitted to [facility]  under the care of Dr. Ranee Gosselin.  Patient had placed  on a routine protocol with IV antibiotics, pain medicine, some blood  thinners.  Patient had some initial postoperative shortness of breath  and chest pain.  Evaluation was completed for that which included lab  work, EKG, chest x-ray, and chest CT.  Patient had a little nodule on  the right lung prior to this preoperative chest x-ray, which was to be  followed by a chest CT postoperatively.  Patient did have a Hemovac in  place prior to being transferred to the recovery room, but this was  pulled out, and patient soaked her dressings.  This was reapplied with  no signs of any problems.  The Hemovac was not replaced.  The patient's  CT indicated that it was negative for any pulmonary embolism,  but there  was a right thyroid nodule and a hypodensity in her spleen.  It did not  indicate any problems otherwise with her lungs, evaluating her  preoperative chest x-ray.  Chest x-ray indicated decreased lung volumes,  compared to previous study.  Minimal atelectasis at the left base.  A  mild pulmonary vascular congestion.  No follow-up problems in her right  lower lung base.   The patient's respiratory status and chest discomfort did resolve  without any further intervention.  Questionable whether or not it may  have been narcotic-related or just anxiety postop.  Could also have been  related to her asthma status.  The primary care physician placed her on  a trial of Spiriva.  The patient's wound remained benign for any signs  of infection, but she did have a lot of ecchymosis about the knee.  The  patient did well with physical therapy.  The patient did well with some  postoperative blood-loss anemia with some hypotension, so she was typed  and crossed for first initially 1 unit and then two days later, 2  additional units.  Patient received all of the blood well without any  signs of problems or reactions.  Patient's vital signs did improve.  It  was felt on postop day #5, the patient was orthopedically stable and  ready for discharge home.  Arrangements were made, and she was  discharged in good condition to home with outpatient home health  physical therapy to monitor her Coumadin.  Also, arrangements were made  for her to follow up with her primary care physician for the thyroid  nodule and the spleen hypodensity.   LABS:  CBC on admission found WBCs 9.2, hemoglobin 9.3, hematocrit 27.1,  platelets 179.  Postoperatively, the hemoglobin dropped to 8.8.  She was  typed and crossed and transfused a total of 3 units of packed red blood  cells with a hemoglobin on the date of discharge of 10.9 with 31.4  hematocrit, platelets at 222.  Routine chemistries on the date of  discharge  found sodium of 140, potassium 3.9, glucose 111, BUN 4,  creatinine 0.63.  INR was 2.2.   Chest CT done on January 8th found negative for pulmonary embolism with  2.2 cm right thyroid lesion, an indeterminate 1.4 cm low density in the  spleen, possibly cyst or atypical hemangioma.   EKG on August 18, 2007 was 96, normal sinus rhythm.  DISCHARGE INSTRUCTIONS:  1. Activity:  Patient is to increase her activities slowly.  She is to      walk with a walker.  She may shower, starting on Sunday.  2. Diet:  No restrictions.  Patient is to continue her diabetic diet.  3. Wound care:  Patient is to change dressing daily.  4. Followup:  Patient needs a follow-up appointment with Dr. Darrelyn Hillock      two weeks from the date of surgery.  Patient is to call [phone]      for that follow-up appointment.  5. Patient needs to follow up with Dr. Alwyn Ren within 2-3 weeks for      follow-up evaluation.   DISCHARGE MEDICATIONS:  1. Mepergan Fortis 1 tablet every 4-6 hours for pain if needed.  2. Coumadin 5 mg once daily unless changed by pharmacist.  3. Robaxin 500 mg 1 tablet every 6 hours for muscle spasms.  4. Patient is to stop her Diovan per Dr. Alwyn Ren, and patient is to see      Dr. Alwyn Ren in 2-3 weeks.  5. Keflex 500 mg 1 tablet 4 times a day until gone, to cover her for      the drainage still coming from her incision.  6. Vytorin 10/40 1 tablet a day.  7. Metformin 500 mg once daily.  8. Toprol XL 25 mg p.r.n.  9. Nexium 40 mg 1 tablet every 2-3 days.  10.MagOx 400 mg 1/2 tablet in the morning and then in the afternoon.  11.Actonel 35 mg once weekly.  12.Vitamin B12 500 mcg once daily.  13.Combivent inhaler 1 puff as needed.  14.Calcium 600 mg plus D 2 capsules in the morning.  15.Dulcolax 2 tablets as needed.   Patient's condition upon discharge to home is listed as improved and  good.      Jamelle Rushing, P.A.    ______________________________  Georges Lynch Darrelyn Hillock, M.D.     RWK/MEDQ  D:  08/22/2007  T:  08/22/2007  Job:  [id number]   cc:   Titus Dubin. Alwyn Ren, MD,FACP,FCCP  [phone] [place]  [place] [address]

## 2010-12-23 NOTE — H&P (Signed)
NAME:  Cheryl Brown, Cheryl Brown             ACCOUNT NO.:  [id number]   MEDICAL RECORD NO.:  [id number]          PATIENT TYPE:  INP   LOCATION:  NA                           [facility]ACILITY[facility]  [facility]   PHYSICIAN:  Georges Lynch. Gioffre, M.D.DATE OF BIRTH:  [birth year]   DATE OF ADMISSION:  08/17/2007  DATE OF DISCHARGE:                              HISTORY & PHYSICAL   CHIEF COMPLAINT:  Painful range of motion left knee.   HISTORY OF PRESENT ILLNESS:  The patient is a 75 year old female with  significant arthritic changes in her left knee.  She has pain with range  of motion and ambulation. She has failed conservative treatment.  The  patient has elected to proceed with a left total knee arthroplasty.   ALLERGIES:  SULFA, CODEINE, MIRAPEX, LYRICA. She does have some itching  with Darvocet but she can get by with Benadryl. She has used [employer] in the past without any untoward events.   CURRENT MEDICATIONS:  1. Vytorin 10/40 1/2-tablet a day.  2. Nexium 40 mg a day.  3. Estring 2 mg ring.  4. Actonel 35 mg once a week.  5. Diovan 160 mg a day.  6. Metformin 500 mg once a day.  7. Mag-Ox 1/2 tablet in the morning and the p.m.  8. Combivent 2 puffs p.r.n.  9. TOPROL XL 25 MG P.R.N. PALPITATIONS ONLY.   PAST MEDICAL HISTORY:  1. Hypertension.  2. Anxiety related to restless leg issues.  3. Diabetes.  4. History of urinary incontinence with frequent UTIs.  5. Asthma.  6. History of a pituitary tumor with dizziness and imbalance issues  20 years previous followed in the past by Dr. Newell Coral.  1. Bilateral corneal implants.  2. History of hiatal hernia.  3. History of reflux disease.  4. History of hemorrhoids.  5. Problems taking medicines causing upset stomach problems and      unusual reactions.   PAST SURGICAL HISTORY:  1. Multiple breast biopsies for a cystic breast.  2. 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  The patient indicates she has had significant nausea  and vomiting  related to anesthesia in the past.   REVIEW OF SYSTEMS:  Positive for left knee pain due to arthritic knee.  HEMATOLOGY. She has had a blood clot 30 years previous in her left lower  extremity.  She has had multiple skin cancer lesions removed in the  past.  NEUROLOGIC:  She denies any recent issues other than some anxiety  related to restless leg syndrome.  She has had problems with a pituitary  tumor in the past with dizziness and balance issues 20 years previous  followed by Dr. Newell Coral and nothing recent.  PULMONARY:  Unremarkable  other than the occasional shortness of breath she has with activities.  CARDIOVASCULAR:  She has had issues related to palpitations in the past  and she just uses the Toprol p.r.n.  She does have issues related to  gastrointestinal issues with an upset stomach, nausea with any with  frequent medicine use.  She uses as little as possible.  She has  had  frequent UTIs related to urinary incontinence.  Diabetes medication  control.  She does not check her blood sugars.   FAMILY MEDICAL HISTORY:  Mother is deceased with complications from  Alzheimer's, hip fractures and sepsis. Father is deceased from prostate  cancer with metastasis into his bones.   SOCIAL HISTORY:  Patient is a widow.  She is retired.  She has got two  children. Her daughter will be caring for her at her house after her  surgery.   PRIMARY CARE PHYSICIAN:  Dr. Marga Melnick who has given medical  clearance.   PHYSICAL EXAMINATION:  VITALS:  Height is 5 feet 2.  Weight is 180  pounds. Blood pressure is 148/88, pulse of 74 and regular, respirations  are 12 and nonlabored.  She is afebrile.  GENERAL:  This is a healthy-appearing female, conscious, alert and  appropriate, appears to be a good historian.  Ambulates with a nice easy  balanced gait.  HEENT:  Head was normocephalic.  Pupils equal, round, and reactive.  Hearing was grossly intact.  NECK:  Supple.  No palpable  lymphadenopathy.  Good range of motion.  CHEST:  Lung sounds were clear and equal bilaterally.  No wheezes,  rales, rhonchi.  HEART:  Regular rate and rhythm.  No murmurs, rubs or gallops.  ABDOMEN:  Soft, positive bowel sounds.  She was nontender.  UPPER EXTREMITIES:  Excellent range of motion of shoulders, elbows and  wrists.  She had amputations of the second, third, fourth and fifth  fingers on her right hand.  LOWER EXTREMITIES:  Right and left hip had full extension and flexion  with good internal and external rotation without any discomfort. The  right knee had full extension, flexion back to 120 degrees with no  instability and no effusion.  No signs of infection.  Calf was soft,  nontender.  Left knee had pain with full extension, gender flexion back  to 90 degrees.  She has pain along the medial and lateral joint. She had  no effusion, no signs of infection, no instability about the knee.  The  calf was soft and nontender.  Ankles had good range of motion  bilaterally.  PERIPHERAL VASCULAR:  Carotid pulses were 2+, no bruits.  Radial pulses  were 2+. Dorsalis pedis pulses were 2+.  She had no lower extremity  edema.  She had a few scattered varicosities.  NEURO:  The patient was conscious, alert and appropriate, appears to be  a good historian and gross neurologic defects noted.  BREAST/ RECTAL/GU:  Exams were deferred at this time.   IMPRESSION:  1. Severe osteoarthritis with painful range of motion and      weightbearing of the left knee.  2. Hypertension.  3. Diabetes.  4. Intermittent palpitations.  5. Asthma.  6. Restless leg syndrome.  7. History of blood clot left lower extremity.  8. History of multiple skin lesions which have been removed.  9. Hiatal hernia.  10.Reflux disease.  11.Hemorrhoids.  12.Urinary incontinence with frequent urinary tract infections.  13.History of pituitary gland tumor 20 years previous with balance      issues.  14.Multiple drug  reactions with frequent side effects.   PLAN:  The patient will be admitted to [facility] on August 17, 2007 after having all routine labs and tests prior to having this  procedure done.  She will have a left total knee arthroplasty by Dr.  Darrelyn Hillock. She has been cleared by Dr. Chrissie Noa  Hopper for this upcoming  procedure.      Jamelle Rushing, P.A.    ______________________________  Georges Lynch Darrelyn Hillock, M.D.    RWK/MEDQ  D:  08/15/2007  T:  08/15/2007  Job:  [id number]

## 2010-12-29 ENCOUNTER — Other Ambulatory Visit: Payer: Self-pay

## 2010-12-29 MED ORDER — NIACIN ER (ANTIHYPERLIPIDEMIC) 500 MG PO TBCR: 500.0000 mg | EXTENDED_RELEASE_TABLET | Freq: Every day | ORAL | Status: DC

## 2010-12-30 ENCOUNTER — Other Ambulatory Visit: Payer: Self-pay | Admitting: Internal Medicine

## 2011-01-12 ENCOUNTER — Telehealth: Payer: Self-pay | Admitting: Internal Medicine

## 2011-01-12 MED ORDER — TIOTROPIUM BROMIDE MONOHYDRATE 18 MCG IN CAPS: 18.0000 ug | ORAL_CAPSULE | Freq: Every day | RESPIRATORY_TRACT | Status: DC

## 2011-01-12 NOTE — Telephone Encounter (Signed)
meds sent in

## 2011-02-03 ENCOUNTER — Other Ambulatory Visit: Payer: Self-pay | Admitting: Internal Medicine

## 2011-02-05 ENCOUNTER — Other Ambulatory Visit: Payer: Self-pay | Admitting: Internal Medicine

## 2011-02-05 DIAGNOSIS — E559 Vitamin D deficiency, unspecified: Secondary | ICD-10-CM

## 2011-02-05 DIAGNOSIS — E785 Hyperlipidemia, unspecified: Secondary | ICD-10-CM

## 2011-02-05 DIAGNOSIS — T887XXA Unspecified adverse effect of drug or medicament, initial encounter: Secondary | ICD-10-CM

## 2011-02-06 ENCOUNTER — Other Ambulatory Visit (INDEPENDENT_AMBULATORY_CARE_PROVIDER_SITE_OTHER): Payer: Medicare Other

## 2011-02-06 DIAGNOSIS — E559 Vitamin D deficiency, unspecified: Secondary | ICD-10-CM

## 2011-02-06 DIAGNOSIS — E785 Hyperlipidemia, unspecified: Secondary | ICD-10-CM

## 2011-02-06 DIAGNOSIS — T887XXA Unspecified adverse effect of drug or medicament, initial encounter: Secondary | ICD-10-CM

## 2011-02-06 DIAGNOSIS — E119 Type 2 diabetes mellitus without complications: Secondary | ICD-10-CM

## 2011-02-06 LAB — HEMOGLOBIN A1C: Hgb A1c MFr Bld: 7 % — ABNORMAL HIGH (ref 4.6–6.5)

## 2011-02-06 LAB — LDL CHOLESTEROL, DIRECT: Direct LDL: 160.3 mg/dL

## 2011-02-06 LAB — LIPID PANEL
Cholesterol: 226 mg/dL — ABNORMAL HIGH (ref 0–200)
Total CHOL/HDL Ratio: 5

## 2011-02-06 NOTE — Progress Notes (Signed)
Labs only

## 2011-02-10 ENCOUNTER — Encounter: Payer: Self-pay | Admitting: Internal Medicine

## 2011-02-16 ENCOUNTER — Encounter: Payer: Self-pay | Admitting: Internal Medicine

## 2011-02-16 ENCOUNTER — Ambulatory Visit (INDEPENDENT_AMBULATORY_CARE_PROVIDER_SITE_OTHER): Payer: Medicare Other | Admitting: Internal Medicine

## 2011-02-16 DIAGNOSIS — R079 Chest pain, unspecified: Secondary | ICD-10-CM

## 2011-02-16 DIAGNOSIS — E8881 Metabolic syndrome: Secondary | ICD-10-CM

## 2011-02-16 DIAGNOSIS — E559 Vitamin D deficiency, unspecified: Secondary | ICD-10-CM

## 2011-02-16 DIAGNOSIS — R635 Abnormal weight gain: Secondary | ICD-10-CM

## 2011-02-16 DIAGNOSIS — M899 Disorder of bone, unspecified: Secondary | ICD-10-CM

## 2011-02-16 DIAGNOSIS — I10 Essential (primary) hypertension: Secondary | ICD-10-CM

## 2011-02-16 DIAGNOSIS — Z8719 Personal history of other diseases of the digestive system: Secondary | ICD-10-CM

## 2011-02-16 DIAGNOSIS — E782 Mixed hyperlipidemia: Secondary | ICD-10-CM

## 2011-02-16 MED ORDER — ONETOUCH DELICA LANCETS MISC: Status: AC

## 2011-02-16 MED ORDER — METFORMIN HCL ER 500 MG PO TB24: 500.0000 mg | ORAL_TABLET | Freq: Every day | ORAL | Status: DC

## 2011-02-16 MED ORDER — GLUCOSE BLOOD VI STRP: ORAL_STRIP | Status: DC

## 2011-02-16 MED ORDER — ROSUVASTATIN CALCIUM 5 MG PO TABS: 5.0000 mg | ORAL_TABLET | Freq: Every day | ORAL | Status: DC

## 2011-02-16 NOTE — Progress Notes (Signed)
  Subjective:    Patient ID: Cheryl Brown, female    DOB: 1936-05-06, 75 y.o.   MRN: [id number]  HPI Please see the problem list with updates on each issue. The pertinent labs pertaining to these problems were reviewed and her risks and options reviewed. Dietary/nutritional and lifestyle changes to address these risks were also discussed.  Complicating disease management is her intolerance to multiple agents.  She expressed that "I'm ready to go". This was not a statement of depression in her mind but one of frustration in reference to her multiple risk and lack of control to date.    Review of Systems     Objective:   Physical Exam Gen.: Healthy and well-nourished in appearance. Alert, appropriate and cooperative throughout exam. Eyes: No corneal or conjunctival inflammation noted.  Mouth: Oral mucosa and oropharynx reveal no lesions or exudates. Mild uvular eythema. Neck: No deformities, masses, or tenderness noted.Thyroid no nodule palpable. Lungs: Normal respiratory effort; chest expands symmetrically. Lungs are clear to auscultation without rales, wheezes, or increased work of breathing. Heart: Normal rate and rhythm. Normal S1 and S2. No gallop, click, or rub. No  murmur. Abdomen: Bowel sounds normal; abdomen soft and nontender. No masses, organomegaly or hernias noted. .                                                                                   Musculoskeletal/extremities: No clubbing, cyanosis, edema.Finger amputations RUE.  Nail health:isolated  mild onycholysis L hand Vascular: Carotid, radial artery, dorsalis pedis and  posterior tibial pulses are full and equal. No bruits present. Neurologic: Alert and oriented x3. Deep tendon reflexes symmetrical and normal.  Light touch normal over feet. Skin: Intact without suspicious lesions or rashes. Lymph: No cervical, axillary lymphadenopathy present. Psych: Mood and affect are normal. Normally interactive                                                                                          Assessment & Plan:  #1 see Problem List with Assessments & Recommendations  #2 severe, intractable reflux suggested  #3 peripheral neuropathy, worse in the mornings  #4 diabetes, poor control  #5 dyslipidemia, LDL goal is less than 100. Plan: see Orders

## 2011-02-16 NOTE — Patient Instructions (Addendum)
Preventive Health Care: Exercise  30-45  minutes a day, 3-4 days a week. Walking is especially valuable in preventing Osteoporosis. Eat a low-fat diet with lots of fruits and vegetables, up to 7-9 servings per day. Avoid obesity; your goal = waist less than 35 inches.Consume less than 30 grams of sugar per day from foods & drinks with High Fructose Corn Syrup as #1,,3 or #4 on label. .Follow the low carb nutrition program in [employer] [facility] [employer] as closely as possible to prevent Diabetes progression & complications.   Check fasting sugar Monday, Wednesday, Friday, and Sunday. Your goal would be 90-150. Check sugars 2 hours after breakfast on Tuesday; 2 hours after lunch on Thursday; and 2 hours after the meal on Saturday. Two hrs after a meal  glucose should be less than 180, ideally less than 160.  Please  schedule fasting Labs : BMET,Lipids, hepatic panel, CK, A1c  In 10 weeks .The triggers for dyspepsia or "heart burn"  include stress; the "aspirin family" ; alcohol; peppermint; and caffeine (coffee, tea, cola, and chocolate). The aspirin family would include aspirin and the nonsteroidal agents such as ibuprofen &  Naproxen. Tylenol would not cause reflux. If having dyspepsia ; food & dink should be avoided for @ least 2 hors before going to bed .Take the Prilosec 30 minutes before breakfast and before the evening meal.

## 2011-02-17 ENCOUNTER — Encounter: Payer: Self-pay | Admitting: Internal Medicine

## 2011-02-17 ENCOUNTER — Other Ambulatory Visit: Payer: Self-pay | Admitting: Internal Medicine

## 2011-02-25 ENCOUNTER — Other Ambulatory Visit: Payer: Self-pay | Admitting: Internal Medicine

## 2011-02-25 MED ORDER — FLUTICASONE-SALMETEROL 100-50 MCG/DOSE IN AEPB: 1.0000 | INHALATION_SPRAY | Freq: Two times a day (BID) | RESPIRATORY_TRACT | Status: DC

## 2011-02-25 NOTE — Telephone Encounter (Signed)
Patient was given sample of advair -patient would like rx called in [facility] [address]

## 2011-02-25 NOTE — Telephone Encounter (Signed)
RX sent to pharmacy  

## 2011-03-11 HISTORY — PX: LAPAROSCOPIC CHOLECYSTECTOMY: SUR755

## 2011-03-18 ENCOUNTER — Telehealth: Payer: Self-pay

## 2011-03-18 NOTE — Telephone Encounter (Signed)
Spoke with patient, patient stated that she did request supplies from [facility] , form completed, faxed and sent for scanning

## 2011-03-18 NOTE — Telephone Encounter (Signed)
Left message on voicemail for patient to return call to discuss if she requested Diabetic supplies from [facility], Diabetic form was completed in 06/2010 for another company. (form on my desk)

## 2011-03-24 ENCOUNTER — Other Ambulatory Visit: Payer: Self-pay | Admitting: Internal Medicine

## 2011-03-24 DIAGNOSIS — N63 Unspecified lump in unspecified breast: Secondary | ICD-10-CM

## 2011-03-24 DIAGNOSIS — Z1231 Encounter for screening mammogram for malignant neoplasm of breast: Secondary | ICD-10-CM

## 2011-04-09 ENCOUNTER — Inpatient Hospital Stay (INDEPENDENT_AMBULATORY_CARE_PROVIDER_SITE_OTHER)
Admission: RE | Admit: 2011-04-09 | Discharge: 2011-04-09 | Disposition: A | Discharge status: Home / Self Care | Payer: Medicare Other | Source: Ambulatory Visit | Attending: Emergency Medicine | Admitting: Emergency Medicine

## 2011-04-09 ENCOUNTER — Ambulatory Visit (HOSPITAL_COMMUNITY)
Admission: RE | Admit: 2011-04-09 | Discharge: 2011-04-09 | Disposition: A | Discharge status: Home / Self Care | Payer: Medicare Other | Source: Ambulatory Visit | Attending: Family Medicine | Admitting: Family Medicine

## 2011-04-09 ENCOUNTER — Telehealth: Payer: Self-pay

## 2011-04-09 DIAGNOSIS — R109 Unspecified abdominal pain: Secondary | ICD-10-CM

## 2011-04-09 DIAGNOSIS — Z9889 Other specified postprocedural states: Secondary | ICD-10-CM

## 2011-04-09 LAB — HEPATIC FUNCTION PANEL
Albumin: 3.7 g/dL (ref 3.5–5.2)
Alkaline Phosphatase: 69 U/L (ref 39–117)
Bilirubin, Direct: <0.1 mg/dL (ref 0.0–0.3)
Total Bilirubin: 0.3 mg/dL (ref 0.3–1.2)

## 2011-04-09 LAB — CBC
HCT: 40.9 % (ref 36.0–46.0)
Hemoglobin: 14 g/dL (ref 12.0–15.0)
MCH: 31.1 pg (ref 26.0–34.0)
MCHC: 34.2 g/dL (ref 30.0–36.0)
MCV: 90.9 fL (ref 78.0–100.0)

## 2011-04-09 LAB — POCT URINALYSIS DIP (DEVICE)
Bilirubin Urine: NEGATIVE
Glucose, UA: NEGATIVE mg/dL
Hgb urine dipstick: NEGATIVE
Specific Gravity, Urine: 1.015 (ref 1.005–1.030)
pH: 6.5 (ref 5.0–8.0)

## 2011-04-09 LAB — DIFFERENTIAL
Eosinophils Relative: 8 % — ABNORMAL HIGH (ref 0–5)
Lymphocytes Relative: 20 % (ref 12–46)
Monocytes Absolute: 0.5 10*3/uL (ref 0.1–1.0)
Monocytes Relative: 9 % (ref 3–12)
Neutro Abs: 3.5 10*3/uL (ref 1.7–7.7)

## 2011-04-09 LAB — AMYLASE: Amylase: 29 U/L (ref 0–105)

## 2011-04-09 NOTE — Telephone Encounter (Signed)
Patient called c/o Stabbing pain in  Chest when trying to take deep breath, patient is very SOB. Patient had gallbladder surgery recently and wonders if this is related to that. I informed patient to go to the emergency room, patient's first response was she did not want to spend her whole day at the emergency room. I informed patient due to her symptoms she needs to seek medical attention quickly.  Patient then agreed that she will go to Urgent Care vs the emergency room

## 2011-04-24 ENCOUNTER — Other Ambulatory Visit: Payer: Self-pay | Admitting: Internal Medicine

## 2011-04-27 ENCOUNTER — Other Ambulatory Visit (INDEPENDENT_AMBULATORY_CARE_PROVIDER_SITE_OTHER): Payer: Medicare Other

## 2011-04-27 ENCOUNTER — Ambulatory Visit
Admission: RE | Admit: 2011-04-27 | Discharge: 2011-04-27 | Disposition: A | Discharge status: Home / Self Care | Payer: Medicare Other | Source: Ambulatory Visit | Attending: Internal Medicine | Admitting: Internal Medicine

## 2011-04-27 ENCOUNTER — Ambulatory Visit: Payer: Medicare Other

## 2011-04-27 DIAGNOSIS — T887XXA Unspecified adverse effect of drug or medicament, initial encounter: Secondary | ICD-10-CM

## 2011-04-27 DIAGNOSIS — E785 Hyperlipidemia, unspecified: Secondary | ICD-10-CM

## 2011-04-27 DIAGNOSIS — N63 Unspecified lump in unspecified breast: Secondary | ICD-10-CM

## 2011-04-27 DIAGNOSIS — IMO0001 Reserved for inherently not codable concepts without codable children: Secondary | ICD-10-CM

## 2011-04-27 LAB — BASIC METABOLIC PANEL
BUN: 16 mg/dL (ref 6–23)
Calcium: 9.5 mg/dL (ref 8.4–10.5)
Chloride: 108 mEq/L (ref 96–112)
Creatinine, Ser: 0.7 mg/dL (ref 0.4–1.2)

## 2011-04-27 LAB — HEPATIC FUNCTION PANEL
AST: 23 U/L (ref 0–37)
Albumin: 4.2 g/dL (ref 3.5–5.2)
Alkaline Phosphatase: 62 U/L (ref 39–117)

## 2011-04-27 LAB — LIPID PANEL
Cholesterol: 202 mg/dL — ABNORMAL HIGH (ref 0–200)
Total CHOL/HDL Ratio: 4

## 2011-04-27 LAB — POTASSIUM: Potassium: 4.1 mEq/L (ref 3.5–5.1)

## 2011-04-27 LAB — LDL CHOLESTEROL, DIRECT: Direct LDL: 125.3 mg/dL

## 2011-04-27 LAB — CREATININE, SERUM: Creatinine, Ser: 0.7 mg/dL (ref 0.4–1.2)

## 2011-04-27 NOTE — Progress Notes (Signed)
Labs only

## 2011-04-30 LAB — DIFFERENTIAL
Basophils Relative: 1
Eosinophils Absolute: 0.2
Monocytes Absolute: 0.6
Monocytes Relative: 10

## 2011-04-30 LAB — T4: T4, Total: 4.7 — ABNORMAL LOW

## 2011-04-30 LAB — CARDIAC PANEL(CRET KIN+CKTOT+MB+TROPI)
CK, MB: 0.9
Relative Index: 0.7
Troponin I: 0.04

## 2011-04-30 LAB — HEMOGLOBIN AND HEMATOCRIT, BLOOD
HCT: 25.5 — ABNORMAL LOW
HCT: 25.8 — ABNORMAL LOW
HCT: 27.2 — ABNORMAL LOW
HCT: 30.3 — ABNORMAL LOW
Hemoglobin: 8.8 — ABNORMAL LOW
Hemoglobin: 8.9 — ABNORMAL LOW
Hemoglobin: 9.1 — ABNORMAL LOW
Hemoglobin: 9.3 — ABNORMAL LOW

## 2011-04-30 LAB — CROSSMATCH

## 2011-04-30 LAB — COMPREHENSIVE METABOLIC PANEL WITH GFR
ALT: 13
AST: 20
Albumin: 3.9
Alkaline Phosphatase: 68
BUN: 9
CO2: 29
Calcium: 9.6
Chloride: 109
Creatinine, Ser: 0.65
GFR calc Af Amer: >60
GFR calc non Af Amer: >60
Glucose, Bld: 92
Potassium: 4.6
Sodium: 145
Total Bilirubin: 0.7
Total Protein: 6.5

## 2011-04-30 LAB — CK TOTAL AND CKMB (NOT AT ARMC)
CK, MB: 1.4
Relative Index: INVALID
Total CK: 99

## 2011-04-30 LAB — BASIC METABOLIC PANEL
CO2: 31
Chloride: 106
GFR calc Af Amer: >60
GFR calc Af Amer: >60
GFR calc non Af Amer: >60
Potassium: 3.5
Potassium: 3.9
Sodium: 130 — ABNORMAL LOW
Sodium: 140

## 2011-04-30 LAB — CBC
HCT: 27.1 — ABNORMAL LOW
HCT: 31.4 — ABNORMAL LOW
HCT: 42.4
Hemoglobin: 10.9 — ABNORMAL LOW
Hemoglobin: 14.3
MCHC: 33.8
MCHC: 34.6
MCV: 88.5
MCV: 92.3
Platelets: 179
Platelets: 261
RBC: 3.55 — ABNORMAL LOW
RBC: 4.59
RDW: 12.7
RDW: 12.7
WBC: 5.4
WBC: 6.2

## 2011-04-30 LAB — TYPE AND SCREEN

## 2011-04-30 LAB — TROPONIN I
Troponin I: 0.09 — ABNORMAL HIGH
Troponin I: 0.2 — ABNORMAL HIGH

## 2011-04-30 LAB — URINALYSIS, ROUTINE W REFLEX MICROSCOPIC
Ketones, ur: NEGATIVE
Nitrite: NEGATIVE
Protein, ur: NEGATIVE
pH: 6

## 2011-04-30 LAB — B-NATRIURETIC PEPTIDE (CONVERTED LAB): Pro B Natriuretic peptide (BNP): 74.6

## 2011-04-30 LAB — PROTIME-INR: INR: 1.2

## 2011-04-30 LAB — APTT: aPTT: 33

## 2011-05-07 ENCOUNTER — Other Ambulatory Visit: Payer: Self-pay | Admitting: Internal Medicine

## 2011-05-07 MED ORDER — CLONAZEPAM 0.5 MG PO TABS: 0.5000 mg | ORAL_TABLET | ORAL | Status: DC

## 2011-05-07 NOTE — Telephone Encounter (Signed)
Rx sent to pharmacy   

## 2011-05-30 ENCOUNTER — Other Ambulatory Visit: Payer: Self-pay | Admitting: Internal Medicine

## 2011-06-10 ENCOUNTER — Encounter: Payer: Self-pay | Admitting: Internal Medicine

## 2011-06-12 ENCOUNTER — Ambulatory Visit (INDEPENDENT_AMBULATORY_CARE_PROVIDER_SITE_OTHER): Payer: Medicare Other | Admitting: Internal Medicine

## 2011-06-12 ENCOUNTER — Encounter: Payer: Self-pay | Admitting: Internal Medicine

## 2011-06-12 DIAGNOSIS — K5900 Constipation, unspecified: Secondary | ICD-10-CM

## 2011-06-12 DIAGNOSIS — E1149 Type 2 diabetes mellitus with other diabetic neurological complication: Secondary | ICD-10-CM | POA: Insufficient documentation

## 2011-06-12 DIAGNOSIS — E119 Type 2 diabetes mellitus without complications: Secondary | ICD-10-CM

## 2011-06-12 DIAGNOSIS — R002 Palpitations: Secondary | ICD-10-CM

## 2011-06-12 DIAGNOSIS — E559 Vitamin D deficiency, unspecified: Secondary | ICD-10-CM

## 2011-06-12 DIAGNOSIS — E782 Mixed hyperlipidemia: Secondary | ICD-10-CM

## 2011-06-12 DIAGNOSIS — I10 Essential (primary) hypertension: Secondary | ICD-10-CM

## 2011-06-12 NOTE — Patient Instructions (Addendum)
Eat a low-fat diet with lots of fruits and vegetables, up to 7-9 servings per day. Avoid obesity; your goal is waist measurement < 40 inches.Consume less than 40 grams of sugar per day from foods & drinks with High Fructose Corn Sugar as #1,2,3 or # 4 on label. Follow the low carb nutrition program in [employer] as closely as possible to prevent Diabetes progression & complications. White carbohydrates (potatoes, rice, bread, and pasta) have a high spike of sugar and a high load of sugar. For example a  baked potato has a cup of sugar and a  french fry  2 teaspoons of sugar. Yams, wild  rice, whole grained bread &  wheat pasta have been much lower spike and load of  sugar. Portions should be the size of a deck of cards or your palm.  To prevent palpitations or premature beats, avoid stimulants such as decongestants, diet pills, nicotine, or caffeine (coffee, tea, cola, or chocolate) to excess.  Please increase the Crestor to twice a week. Your LDL goal is less than 100, ideally less than 70.  Increase roughage (fruits and vegetables) and your diet as recommended. Drink to thirst up to 40 ounces of water a day. Metamucil or other such agents may help the constipation. Take MiraLax every third night as needed for constipation.   Please  schedule fasting Labs in 10 weeks : Lipids, hepatic panel, A1c, urine microalbumin, vitamin D level. Please bring these instructions to that Lab appt.

## 2011-06-12 NOTE — Progress Notes (Signed)
Subjective:    Patient ID: Cheryl Brown, female    DOB: 09-Mar-1936, 75 y.o.   MRN: [id number]  HPI Diabetes status assessment: Fasting or morning glucose range:  98-127  Highest glucose 2 hours after any meal:  < 150. Hypoglycemia :  no .                                                     Excess thirst :no;  Excess hunger:  no ;  Excess urination:  no.                                  Lightheadedness with standing:  no. Chest pain:  no ; Palpitations :occasionally ;  Pain in  calves with walking:  no .                                                                                                                                Non healing skin  ulcers or sores,especially over the feet:  no. Numbness or tingling or burning in feet : burning & tingling .                                                                                                                                              Significant change in  Weight : stable. Vision changes : no; no retinopathy  .                                                                    Exercise : no . Nutrition/diet:  Decreased carbs. Medication compliance : yes. Medication adverse  Effects:  no . Eye exam : Dr Elmer Picker seen 05/2011. Foot care : no.  A1c/ urine microalbumin monitor:  6.3% , prev 7% LDL 125, prev 160       Review of Systems She has  had intermittent "fluttering" for several weeks; these are not exertional & not daily. Her TSH is therapeutic and electrolytes are normal.  Since her gallbladder surgery in August she has had constipation. She's been taking MiraLax as needed. She is on metformin 500 mg daily     Objective:   Physical Exam Gen.: Healthy  & well-nourished, appropriate and alert, appears younger than age Eyes: No lid/conjunctival changes, extraocular motion intact, fundi not examined (see above) Neck: Normal range of motion, thyroid normal Respiratory: No increased work of breathing or abnormal breath  sounds Cardiac : regular rhythm, no extra heart sounds, gallop, murmur. Physiologic S 4 Abdomen: No organomegaly ,masses, bruits or aortic enlargement Lymph: No lymphadenopathy of the neck or axilla Skin: No rashes, lesions, ulcers or ischemic changes Muscle skeletal: no nail changes Vasc:All pulses intact, no bruits present Neuro: Normal deep tendon reflexes, alert & oriented, sensation over feet Psych: judgment and insight, mood and affect normal         Assessment & Plan:  #1 diabetes; excellent control. In fact medication adjustment as appropriate  #2 dyslipidemia; significant improvement with a decrease of 35% of risk long-term. LDL is still at goal less than 100  #3 subjective fluttering; no dysrhythmia on exam  #4 constipation since gallbladder surgery  Plan: See orders and recommendations.

## 2011-07-24 ENCOUNTER — Other Ambulatory Visit: Payer: Self-pay

## 2011-07-24 MED ORDER — TIOTROPIUM BROMIDE MONOHYDRATE 18 MCG IN CAPS: 18.0000 ug | ORAL_CAPSULE | Freq: Every day | RESPIRATORY_TRACT | Status: DC

## 2011-07-24 NOTE — Telephone Encounter (Signed)
RX sent

## 2011-08-10 ENCOUNTER — Other Ambulatory Visit: Payer: Self-pay | Admitting: Internal Medicine

## 2011-08-10 MED ORDER — VALSARTAN 160 MG PO TABS: 160.0000 mg | ORAL_TABLET | Freq: Every day | ORAL | Status: DC

## 2011-08-10 NOTE — Telephone Encounter (Signed)
Rx sent 

## 2011-08-20 ENCOUNTER — Other Ambulatory Visit: Payer: Self-pay | Admitting: Internal Medicine

## 2011-08-20 DIAGNOSIS — E782 Mixed hyperlipidemia: Secondary | ICD-10-CM

## 2011-08-20 DIAGNOSIS — E559 Vitamin D deficiency, unspecified: Secondary | ICD-10-CM

## 2011-08-20 DIAGNOSIS — E119 Type 2 diabetes mellitus without complications: Secondary | ICD-10-CM

## 2011-08-20 DIAGNOSIS — I10 Essential (primary) hypertension: Secondary | ICD-10-CM

## 2011-08-21 ENCOUNTER — Other Ambulatory Visit (INDEPENDENT_AMBULATORY_CARE_PROVIDER_SITE_OTHER): Payer: Medicare Other

## 2011-08-21 ENCOUNTER — Other Ambulatory Visit: Payer: Self-pay | Admitting: Internal Medicine

## 2011-08-21 DIAGNOSIS — E782 Mixed hyperlipidemia: Secondary | ICD-10-CM

## 2011-08-21 DIAGNOSIS — I10 Essential (primary) hypertension: Secondary | ICD-10-CM

## 2011-08-21 DIAGNOSIS — E119 Type 2 diabetes mellitus without complications: Secondary | ICD-10-CM

## 2011-08-21 DIAGNOSIS — E559 Vitamin D deficiency, unspecified: Secondary | ICD-10-CM

## 2011-08-21 LAB — HEPATIC FUNCTION PANEL
AST: 23 U/L (ref 0–37)
Albumin: 4.1 g/dL (ref 3.5–5.2)
Alkaline Phosphatase: 69 U/L (ref 39–117)
Bilirubin, Direct: 0.1 mg/dL (ref 0.0–0.3)

## 2011-08-21 LAB — LIPID PANEL
HDL: 57.4 mg/dL (ref >39.00)
VLDL: 29 mg/dL (ref 0.0–40.0)

## 2011-08-21 LAB — LDL CHOLESTEROL, DIRECT: Direct LDL: 136.1 mg/dL

## 2011-08-21 LAB — HEMOGLOBIN A1C: Hgb A1c MFr Bld: 6.5 % (ref 4.6–6.5)

## 2011-08-22 LAB — VITAMIN D 25 HYDROXY (VIT D DEFICIENCY, FRACTURES): Vit D, 25-Hydroxy: 56 ng/mL (ref 30–89)

## 2011-12-22 ENCOUNTER — Ambulatory Visit (INDEPENDENT_AMBULATORY_CARE_PROVIDER_SITE_OTHER): Payer: Medicare Other | Admitting: Family Medicine

## 2011-12-22 ENCOUNTER — Encounter: Payer: Self-pay | Admitting: Family Medicine

## 2011-12-22 VITALS — BP 142/90 | HR 77 | Temp 98.1°F | Wt 195.2 lb

## 2011-12-22 DIAGNOSIS — B029 Zoster without complications: Secondary | ICD-10-CM

## 2011-12-22 MED ORDER — GABAPENTIN 300 MG PO CAPS: ORAL_CAPSULE | ORAL | Status: DC

## 2011-12-22 MED ORDER — TRAMADOL HCL 50 MG PO TABS: 50.0000 mg | ORAL_TABLET | Freq: Three times a day (TID) | ORAL | Status: AC | PRN

## 2011-12-22 MED ORDER — VALACYCLOVIR HCL 1 G PO TABS: ORAL_TABLET | ORAL | Status: DC

## 2011-12-22 NOTE — Patient Instructions (Signed)

## 2011-12-22 NOTE — Progress Notes (Signed)
  Subjective:     Cheryl Brown is a 76 y.o. female who presents for evaluation of a rash involving the lumbar region. Rash started 2 days ago. Lesions are pink, and blistering in texture. Rash has changed over time. Rash is painful. Associated symptoms: none. Patient denies: abdominal pain, arthralgia, congestion, cough, crankiness, decrease in appetite, decrease in energy level, fever, headache, irritability, myalgia, nausea, sore throat and vomiting. Patient has not had contacts with similar rash. Patient has not had new exposures (soaps, lotions, laundry detergents, foods, medications, plants, insects or animals).  The following portions of the patient's history were reviewed and updated as appropriate: allergies, current medications, past family history, past medical history, past social history, past surgical history and problem list.  Review of Systems Pertinent items are noted in HPI.    Objective:    BP 142/90  Pulse 77  Temp(Src) 98.1 F (36.7 C) (Oral)  Wt 195 lb 3.2 oz (88.542 kg)  SpO2 94% General:  alert, cooperative, appears stated age and mild distress  Skin:  vesicles noted on low back     Assessment:    shingles    Plan:    Medications: antiviral, neurontin, tramadol. verbal and written patient instruction given. f/u prn

## 2011-12-23 ENCOUNTER — Telehealth: Payer: Self-pay | Admitting: Internal Medicine

## 2011-12-23 ENCOUNTER — Emergency Department (HOSPITAL_COMMUNITY)
Admission: EM | Admit: 2011-12-23 | Discharge: 2011-12-23 | Disposition: A | Discharge status: Home / Self Care | Payer: Medicare Other | Attending: Emergency Medicine | Admitting: Emergency Medicine

## 2011-12-23 ENCOUNTER — Encounter (HOSPITAL_COMMUNITY): Payer: Self-pay | Admitting: Emergency Medicine

## 2011-12-23 ENCOUNTER — Emergency Department (HOSPITAL_COMMUNITY): Payer: Medicare Other

## 2011-12-23 DIAGNOSIS — J4489 Other specified chronic obstructive pulmonary disease: Secondary | ICD-10-CM | POA: Insufficient documentation

## 2011-12-23 DIAGNOSIS — E785 Hyperlipidemia, unspecified: Secondary | ICD-10-CM | POA: Insufficient documentation

## 2011-12-23 DIAGNOSIS — R51 Headache: Secondary | ICD-10-CM | POA: Insufficient documentation

## 2011-12-23 DIAGNOSIS — J449 Chronic obstructive pulmonary disease, unspecified: Secondary | ICD-10-CM | POA: Insufficient documentation

## 2011-12-23 DIAGNOSIS — Z79899 Other long term (current) drug therapy: Secondary | ICD-10-CM | POA: Insufficient documentation

## 2011-12-23 DIAGNOSIS — I10 Essential (primary) hypertension: Secondary | ICD-10-CM | POA: Insufficient documentation

## 2011-12-23 DIAGNOSIS — E119 Type 2 diabetes mellitus without complications: Secondary | ICD-10-CM | POA: Insufficient documentation

## 2011-12-23 DIAGNOSIS — R112 Nausea with vomiting, unspecified: Secondary | ICD-10-CM | POA: Insufficient documentation

## 2011-12-23 DIAGNOSIS — K589 Irritable bowel syndrome without diarrhea: Secondary | ICD-10-CM | POA: Insufficient documentation

## 2011-12-23 DIAGNOSIS — R21 Rash and other nonspecific skin eruption: Secondary | ICD-10-CM | POA: Insufficient documentation

## 2011-12-23 HISTORY — DX: Reserved for inherently not codable concepts without codable children: IMO0001

## 2011-12-23 HISTORY — DX: Zoster without complications: B02.9

## 2011-12-23 HISTORY — DX: Encounter for other specified aftercare: Z51.89

## 2011-12-23 LAB — BASIC METABOLIC PANEL
Calcium: 8.8 mg/dL (ref 8.4–10.5)
Creatinine, Ser: 0.63 mg/dL (ref 0.50–1.10)
GFR calc Af Amer: >90 mL/min (ref >90)
GFR calc non Af Amer: 87 mL/min — ABNORMAL LOW (ref >90)

## 2011-12-23 LAB — CBC
HCT: 41.8 % (ref 36.0–46.0)
MCHC: 34.4 g/dL (ref 30.0–36.0)
MCV: 90.7 fL (ref 78.0–100.0)
Platelets: 205 10*3/uL (ref 150–400)
RDW: 12.4 % (ref 11.5–15.5)

## 2011-12-23 LAB — DIFFERENTIAL
Basophils Absolute: 0.1 10*3/uL (ref 0.0–0.1)
Basophils Relative: 1 % (ref 0–1)
Eosinophils Relative: 1 % (ref 0–5)
Monocytes Absolute: 0.6 10*3/uL (ref 0.1–1.0)

## 2011-12-23 MED ORDER — DIPHENHYDRAMINE HCL 50 MG/ML IJ SOLN
25.0000 mg | Freq: Once | INTRAMUSCULAR | Status: AC
  Administered 2011-12-23: 25 mg via INTRAVENOUS
  Filled 2011-12-23: qty 1

## 2011-12-23 MED ORDER — DROPERIDOL 2.5 MG/ML IJ SOLN
0.6250 mg | Freq: Once | INTRAMUSCULAR | Status: AC
  Administered 2011-12-23: 0.625 mg via INTRAVENOUS
  Filled 2011-12-23: qty 0.25

## 2011-12-23 MED ORDER — HYDROCODONE-ACETAMINOPHEN 5-325 MG PO TABS: ORAL_TABLET | ORAL | Status: DC

## 2011-12-23 MED ORDER — PROMETHAZINE HCL 12.5 MG RE SUPP: RECTAL | Status: DC

## 2011-12-23 MED ORDER — CYCLOBENZAPRINE HCL 5 MG PO TABS: 5.0000 mg | ORAL_TABLET | Freq: Three times a day (TID) | ORAL | Status: AC | PRN

## 2011-12-23 MED ORDER — SODIUM CHLORIDE 0.9 % IV SOLN
INTRAVENOUS | Status: DC
  Administered 2011-12-23: 14:00:00 via INTRAVENOUS

## 2011-12-23 MED ORDER — SODIUM CHLORIDE 0.9 % IV BOLUS (SEPSIS)
500.0000 mL | INTRAVENOUS | Status: AC
  Administered 2011-12-23: 14:00:00 via INTRAVENOUS

## 2011-12-23 MED ORDER — METOCLOPRAMIDE HCL 5 MG/ML IJ SOLN
10.0000 mg | Freq: Once | INTRAMUSCULAR | Status: AC
  Administered 2011-12-23: 10 mg via INTRAVENOUS
  Filled 2011-12-23: qty 2

## 2011-12-23 NOTE — Discharge Instructions (Signed)
Drink plenty of fluids. Try to take the Valtrex for your shingles. Try the Flexeril and Phenergan suppositories for pain and headache or nausea and vomiting respectively. Recheck if you get fever, worsening headache, or worsening pain.

## 2011-12-23 NOTE — Telephone Encounter (Signed)
Caller: Anna/Child is calling today 12/23/11 regarding having headache, vomiting and chills.  Onset this AM.  Saw Dr. Laury Axon for shingles yesterday.  Was prescribed Tramadol and Valcyclovir.  Emergent symptoms r/o by Headache guidelines with exception of new onset of vomiting associated with severe headache.  No appts available in [facility], pt does not wish to go to [facility] to see Sandford Craze.  [facility] contacted office and spoke with Sunny Schlein who talked with Dr. Beverely Low and MD advised for pt to go to ED.  [facility].  Pt said she will go to [facility].

## 2011-12-23 NOTE — ED Notes (Signed)
Prescribed Valacyclovir HcL1 GM per PCP yesterday, states made her nausea, headache, chills

## 2011-12-23 NOTE — ED Provider Notes (Signed)
History     CSN: [id number]  Arrival date & time 12/23/11  1240   First MD Initiated Contact with Patient 12/23/11 1307      Chief Complaint  Patient presents with  . Allergic Reaction    (Consider location/radiation/quality/duration/timing/severity/associated sxs/prior treatment) HPI  Patient relates about 2 days ago she started getting a painful rash in her right buttock. She was seen yesterday by her doctor and started on tramadol and Valtrex. She states she took one dose of each last night. She states earlier today about 8 AM she woke up and had nausea with vomiting once. She states she has a headache that hurts when she coughs. She states her headache starts the base of her head and comes around to her for it and is throbbing in nature. She states she's not had headaches like this before. She denies abdominal pain, chest pain, patient does have chronic shortness of breath and coughing from her underlying lung disease. She states coughing makes her headache hurt more. She denies any visual changes. She has chronic numbness in her extremities from neuropathy. She denies any visual change or sore throat.  She states she had shingles years ago that was on her face. She states at that time she took a medication for it that was "expensive" because of concern of getting shingles in her eye  PCP Dr. Alwyn Ren Orthopedist Dr. Darrelyn Hillock  Past Medical History  Diagnosis Date  . Hyperlipemia   . Reflux esophagitis   . Palpitations   . Dysmetabolic syndrome   . Hypertension   . IBS (irritable bowel syndrome)   . Restless leg syndrome   . Neoplasm of pituitary gland     Dr.Nudelman   . Scoliosis     Spinal Stenosis  . COPD (chronic obstructive pulmonary disease)   . Pituitary adenoma 1991    Dr.Love and Dr.Nudelman   . Shingles outbreak   . Diabetes mellitus   . Blood transfusion     Past Surgical History  Procedure Date  . Partial hysterectomy     for dysfunctional bleeding   .  Breast lumpectomy     4 lumps removed from breast, all benign   . Finger amputation     related to work injury   . Esophageal dilation 01/2003  . Ankle fracture surgery     left ankle  . Knee surgery 08/2007     complicated by difficult resuscitation post anesthesia   . G2 p2   . Pituitary adenoma     Dr. Sandria Manly  . Laparoscopic cholecystectomy 03/2011    [place], [place]    Family History  Problem Relation Age of Onset  . Bone cancer Father   . Prostate cancer Father   . Diabetes Father   . Coronary artery disease Father   . Cancer Father     bone, prostate  . Alzheimer's disease Mother   . Arthritis Mother   . Mental illness Mother     alzheimers  . Alzheimer's disease Maternal Aunt   . Mental illness Maternal Aunt     alzheimers  . Mitral valve prolapse Sister   . Hypertension Sister   . Diabetes Sister   . Stroke Paternal Grandmother   . Osteoporosis Sister   . Mental illness Maternal Grandmother     alzheimers    History  Substance Use Topics  . Smoking status: Former Smoker    Quit date: 08/10/1978  . Smokeless tobacco: Never Used  . Alcohol Use: No  lives with daughter  OB History    Grav Para Term Preterm Abortions TAB SAB Ect Mult Living                  Review of Systems  All other systems reviewed and are negative.    Allergies  Morphine and related; Codeine; Ipratropium-albuterol; Niaspan; Pramipexole dihydrochloride; Pregabalin; Propoxyphene-acetaminophen; Simvastatin; and Sulfonamide derivatives  Home Medications   Current Outpatient Rx  Name Route Sig Dispense Refill  . CLONAZEPAM 0.5 MG PO TABS Oral Take 1 tablet (0.5 mg total) by mouth as directed. 1-2 by mouth at bedtime as needed 60 tablet 1  . FLUTICASONE-SALMETEROL 100-50 MCG/DOSE IN AEPB Inhalation Inhale 1 puff into the lungs every 12 (twelve) hours. 60 each 5  . GABAPENTIN 300 MG PO CAPS Oral Take 300 mg by mouth daily as needed.    [name] GLUCOSE BLOOD VI STRP  Check blood sugar as  directed 100 each 3  . FISH OIL 300 MG PO CAPS Oral Take 900 mg by mouth daily.    Letta Pate DELICA LANCETS MISC  Check blood sugar as directed 100 each 3  . TIOTROPIUM BROMIDE MONOHYDRATE 18 MCG IN CAPS Inhalation Place 1 capsule (18 mcg total) into inhaler and inhale daily. 30 capsule 5  . TRAMADOL HCL 50 MG PO TABS Oral Take 1 tablet (50 mg total) by mouth every 8 (eight) hours as needed for pain. 15 tablet 0  . VALSARTAN 160 MG PO TABS Oral Take 1 tablet (160 mg total) by mouth daily. 30 tablet 4  . VITAMIN B-12 500 MCG PO TABS Oral Take 500 mcg by mouth daily.        BP 152/93  Pulse 77  Temp(Src) 98.5 F (36.9 C) (Oral)  SpO2 92%  Vital signs normal    Physical Exam  Constitutional: She is oriented to person, place, and time. She appears well-developed and well-nourished.  Non-toxic appearance. She does not appear ill. No distress.  HENT:  Head: Normocephalic and atraumatic.  Right Ear: External ear normal.  Left Ear: External ear normal.  Nose: Nose normal. No mucosal edema or rhinorrhea.  Mouth/Throat: Oropharynx is clear and moist and mucous membranes are normal. No dental abscesses or uvula swelling.  Eyes: Conjunctivae and EOM are normal. Pupils are equal, round, and reactive to light.  Neck: Normal range of motion and full passive range of motion without pain. Neck supple.  Cardiovascular: Normal rate, regular rhythm and normal heart sounds.  Exam reveals no gallop and no friction rub.   No murmur heard. Pulmonary/Chest: Effort normal and breath sounds normal. No respiratory distress. She has no wheezes. She has no rhonchi. She has no rales. She exhibits no tenderness and no crepitus.  Abdominal: Soft. Normal appearance and bowel sounds are normal. She exhibits no distension. There is no tenderness. There is no rebound and no guarding.  Musculoskeletal: Normal range of motion. She exhibits no edema and no tenderness.       Moves all extremities well.   Neurological: She  is alert and oriented to person, place, and time. She has normal strength. No cranial nerve deficit.  Skin: Skin is warm, dry and intact. No rash noted. No erythema. No pallor.       Patient has some purplish cluster of vesicles on her left buttock consistent with shingles  Psychiatric: She has a normal mood and affect. Her speech is normal and behavior is normal. Her mood appears not anxious.    ED Course  Procedures (including critical care time)   Medications  0.9 %  sodium chloride infusion (  Intravenous New Bag/Given 12/23/11 1425)  sodium chloride 0.9 % bolus 500 mL ( mL Intravenous Given 12/23/11 1357)  metoCLOPramide (REGLAN) injection 10 mg (10 mg Intravenous Given 12/23/11 1425)  diphenhydrAMINE (BENADRYL) injection 25 mg (25 mg Intravenous Given 12/23/11 1423)  droperidol (INAPSINE) injection 0.625 mg (0.625 mg Intravenous Given 12/23/11 1556)   Patient initially given IV Benadryl and Reglan. When she was rechecked she said her headache was improved but still present. She then was given droperidol and she relates now her headache is almost gone. However she complains of worsening pain in the distribution of her shingles. Patient states however she cannot take any pain medications. She also does not want to take steroids for her shingles because of weight gain. Patient relates most pain medications give her headache and nausea and vomiting. She cannot tell me a pain medicine that she is able to tolerate.   Results for orders placed during the hospital encounter of 12/23/11  CBC      Component Value Range   WBC 7.4  4.0 - 10.5 (K/uL)   RBC 4.61  3.87 - 5.11 (MIL/uL)   Hemoglobin 14.4  12.0 - 15.0 (g/dL)   HCT 40.9  81.1 - 91.4 (%)   MCV 90.7  78.0 - 100.0 (fL)   MCH 31.2  26.0 - 34.0 (pg)   MCHC 34.4  30.0 - 36.0 (g/dL)   RDW 78.2  95.6 - 21.3 (%)   Platelets 205  150 - 400 (K/uL)  DIFFERENTIAL      Component Value Range   Neutrophils Relative 80 (*) 43 - 77 (%)   Neutro Abs  5.9  1.7 - 7.7 (K/uL)   Lymphocytes Relative 10 (*) 12 - 46 (%)   Lymphs Abs 0.7  0.7 - 4.0 (K/uL)   Monocytes Relative 8  3 - 12 (%)   Monocytes Absolute 0.6  0.1 - 1.0 (K/uL)   Eosinophils Relative 1  0 - 5 (%)   Eosinophils Absolute 0.1  0.0 - 0.7 (K/uL)   Basophils Relative 1  0 - 1 (%)   Basophils Absolute 0.1  0.0 - 0.1 (K/uL)  BASIC METABOLIC PANEL      Component Value Range   Sodium 134 (*) 135 - 145 (mEq/L)   Potassium 4.6  3.5 - 5.1 (mEq/L)   Chloride 99  96 - 112 (mEq/L)   CO2 26  19 - 32 (mEq/L)   Glucose, Bld 113 (*) 70 - 99 (mg/dL)   BUN 13  6 - 23 (mg/dL)   Creatinine, Ser 0.86  0.50 - 1.10 (mg/dL)   Calcium 8.8  8.4 - 57.8 (mg/dL)   GFR calc non Af Amer 87 (*) >90 (mL/min)   GFR calc Af Amer >90  >90 (mL/min)   Laboratory interpretation all normal except hyponatremia, hyperglycemia   Date: 12/23/2011  Rate: 70  Rhythm: normal sinus rhythm  QRS Axis: left  Intervals: normal  ST/T Wave abnormalities: normal  Conduction Disutrbances:none  Narrative Interpretation:   Old EKG Reviewed: unchanged from 09/27/2008     1. Headache     New Prescriptions   CYCLOBENZAPRINE (FLEXERIL) 5 MG TABLET    Take 1 tablet (5 mg total) by mouth 3 (three) times daily as needed for muscle spasms.   HYDROCODONE-ACETAMINOPHEN (NORCO) 5-325 MG PER TABLET    Take 1 or 2 po Q 6hrs for pain   PROMETHAZINE (PHENERGAN)  12.5 MG SUPPOSITORY    Unwrap in place 1 per rectum every 4-6 hours when necessary nausea, vomiting, or headache    Plan discharge   Devoria Albe, MD, FACEP   MDM          Ward Givens, MD 12/23/11 [phone]

## 2012-02-08 ENCOUNTER — Telehealth: Payer: Self-pay | Admitting: Internal Medicine

## 2012-02-08 MED ORDER — CLONAZEPAM 0.5 MG PO TABS: 0.5000 mg | ORAL_TABLET | ORAL | Status: DC

## 2012-02-08 NOTE — Telephone Encounter (Signed)
Refill: Clonazepam 0.5mg  tablet. Take 1 to 2 tablets by mouth at bedtime as needed.

## 2012-02-08 NOTE — Telephone Encounter (Signed)
RX sent

## 2012-02-22 ENCOUNTER — Ambulatory Visit (INDEPENDENT_AMBULATORY_CARE_PROVIDER_SITE_OTHER): Payer: Medicare Other | Admitting: Internal Medicine

## 2012-02-22 ENCOUNTER — Encounter: Payer: Self-pay | Admitting: Internal Medicine

## 2012-02-22 VITALS — BP 146/100 | HR 91 | Temp 97.8°F | Wt 188.0 lb

## 2012-02-22 DIAGNOSIS — I10 Essential (primary) hypertension: Secondary | ICD-10-CM

## 2012-02-22 DIAGNOSIS — R51 Headache: Secondary | ICD-10-CM

## 2012-02-22 MED ORDER — VERAPAMIL HCL 80 MG PO TABS: 80.0000 mg | ORAL_TABLET | Freq: Three times a day (TID) | ORAL | Status: DC

## 2012-02-22 NOTE — Progress Notes (Signed)
  Subjective:    Patient ID: Cheryl Brown, female    DOB: 1935-09-13, 76 y.o.   MRN: [id number]  HPI She has had an intermittent, pounding headache daily for the last 2 weeks. This is variably  localized to the temples, crown, and occiput at different times. It will be present for one to 2 minutes. She treated the headaches with clonazepam and Tylenol but this combination "tore up my stomach". She has not taken the gabapentin for the headaches. She has noted that her blood pressure has been elevated; it has ranged from 154/95-161/100.  She had a history of migraines remotely while she was working; these were with parenteral therapy on occasion She's had some nausea. Last week she had profound dizziness and near syncope.   Review of Systems She denies significant tearing of her eyes, blurred vision, double vision, or loss of vision. She has not had photophobia or phonophobia. She also denies tinnitus or hearing loss. She has not had fever,severe pain or stiffness in the neck but had some pain with extension of the neck. Numbness and tingling is a chronic problem in her legs. There's been no associated limb weakness or altered mental status. With hypertension she has not had chest pain, palpitations, or claudication. Dyspnea is a chronic problem.  She is under a great deal of stress and she has 2 adult children living with her; both are unemployed                                                              Objective:   Physical Exam  Gen. appearance: Well-nourished, in no distress Eyes: Extraocular motion intact, field of vision normal, vision grossly intact with lenses, no nystagmus ENT: Canals clear, old perforation right tympanic membrane, left TM scarred, tuning fork exam normal, hearing grossly normal to whisper at 6 feet Neck: Normal range of motion, no masses, normal thyroid Cardiovascular: Rate and rhythm normal; no murmurs, gallops or extra heart sounds Muscle skeletal: Post traumatic  changes R hand, tone, &  strength normal Neuro:no cranial nerve deficit, deep tendon  reflexes normal, gait normal, Romberg testing and finger to nose testing normal Lymph: No cervical or axillary LA Skin: Warm and dry without suspicious lesions or rashes Psych: no anxiety or mood change. Normally interactive and cooperative.         Assessment & Plan:  #1 headache, more variation in location  #2 hypertension, uncontrolled  #3 multiple drug intolerances including codeine

## 2012-02-22 NOTE — Patient Instructions (Addendum)
Blood Pressure Goal  Ideally is an AVERAGE < 135/85. This AVERAGE should be calculated from @ least 5-7 BP readings taken @ different times of day on different days of week. You should not respond to isolated BP readings , but rather the AVERAGE for that week  Assess response to the gabapentin one every 8 hours as needed.  Please keep a diary of your headaches . Document  each occurrence on the calendar with notation of : #1 any prodrome ( any non headache symptom such as marked fatigue,visual changes, ,etc ) which precedes actual headache ; #2) severity on 1-10 scale; #3) any triggers ( food/ drink,enviromenntal or weather changes ,physical or emotional stress) in 8-12 hour period prior to the headache; & #4) response to any medications or other intervention. Please review "Headache" @ [facility] for additional information.

## 2012-02-23 ENCOUNTER — Telehealth: Payer: Self-pay | Admitting: Internal Medicine

## 2012-02-23 NOTE — Telephone Encounter (Signed)
TRIAGE MESSAGE: 7.16.13 9:32am Caller Cheryl Brown CB# [phone] Has questions regarding medications given at appointment yesterday as well as medications she is currently on right now. 2-medications are DIOVAN & Verapamil  Please call when you can

## 2012-02-23 NOTE — Telephone Encounter (Signed)
I spoke with patient and she questioned if she was to continue Diovan along with verapamil. Per Dr.Hopper (he was standing near by at the time of call) yes patient to take both medications

## 2012-02-29 ENCOUNTER — Telehealth: Payer: Self-pay | Admitting: *Deleted

## 2012-02-29 NOTE — Telephone Encounter (Signed)
Discuss with patient  

## 2012-02-29 NOTE — Telephone Encounter (Signed)
Loraine Leriche this as intolerance in  the chart.please hold  the verapamil and monitor blood pressure and edema.Blood Pressure Goal  Ideally is an AVERAGE < 135/85. This AVERAGE should be calculated from @ least 5-7 BP readings taken @ different times of day on different days of week. You should not respond to isolated BP readings , but rather the AVERAGE for that week

## 2012-02-29 NOTE — Telephone Encounter (Signed)
Pt left VM that verapamil is causing feet and legs to swell. Pt indicated that she elevated legs and swelling went down but notes that this one of the side effect listed on drug store hand out.Marland KitchenPlease advise

## 2012-03-02 ENCOUNTER — Encounter (HOSPITAL_COMMUNITY): Payer: Self-pay

## 2012-03-02 ENCOUNTER — Emergency Department (HOSPITAL_COMMUNITY)
Admission: EM | Admit: 2012-03-02 | Discharge: 2012-03-02 | Disposition: A | Discharge status: Home / Self Care | Payer: Medicare Other | Source: Home / Self Care | Attending: Emergency Medicine | Admitting: Emergency Medicine

## 2012-03-02 DIAGNOSIS — N390 Urinary tract infection, site not specified: Secondary | ICD-10-CM

## 2012-03-02 LAB — POCT URINALYSIS DIP (DEVICE)
Glucose, UA: NEGATIVE mg/dL
Nitrite: NEGATIVE
Urobilinogen, UA: 0.2 mg/dL (ref 0.0–1.0)

## 2012-03-02 MED ORDER — CEFDINIR 300 MG PO CAPS: 300.0000 mg | ORAL_CAPSULE | Freq: Two times a day (BID) | ORAL | Status: AC

## 2012-03-02 MED ORDER — ONDANSETRON 8 MG PO TBDP: 8.0000 mg | ORAL_TABLET | Freq: Three times a day (TID) | ORAL | Status: AC | PRN

## 2012-03-02 NOTE — ED Provider Notes (Signed)
Chief Complaint  Patient presents with  . Dysuria    History of Present Illness:   [name] is a 76 year old female with multiple medical problems including COPD and diabetes who has had recurrent urinary tract infections. She states this all began with a bout with shingles about 2 months ago. Over the past month he's had 3 separate urinary tract infections. The first time she saw her gynecologist, Dr. Huel Cote were prescribed Cipro. This made her sick and it turned out that the bacteria was resistant to Cipro. She was then prescribed cephalexin. This was 2 weeks ago. She's finished this up, but the symptoms have come back again. She's allergic to sulfa. For the past 3 days she's had dysuria, burning, frequency, urgency, incontinence, and some blood in the urine. She's felt a little chilled but had no fever. No nausea or vomiting. She has pressure in the suprapubic area and some lower back pain as well. She denies any GYN complaints. I called Dr. Berenda Morale office and have them redraw the sensitivities are her last bacterial isolate. Of oral antibiotics it was resistant to Cipro but sensitive to sulfa and first and second generation cephalosporins. It was also sensitive to Macrobid.  Review of Systems:  Other than noted above, the patient denies any of the following symptoms: General:  No fevers, chills, sweats, aches, or fatigue. GI:  No abdominal pain, back pain, nausea, vomiting, diarrhea, or constipation. GU:  No dysuria, frequency, urgency, hematuria, or incontinence. GYN:  No discharge, itching, vulvar pain or lesions, pelvic pain, or abnormal vaginal bleeding.  PMFSH:  Past medical history, family history, social history, meds, and allergies were reviewed.  Physical Exam:   Vital signs:  BP 134/67  Pulse 74  Temp 98.5 F (36.9 C) (Oral)  Resp 18  SpO2 94% Gen:  Alert, oriented, in no distress. Lungs:  Clear to auscultation, no wheezes, rales or rhonchi. Heart:  Regular  rhythm, no gallop or murmer. Abdomen:  Flat and soft. There was slight suprapubic pain to palpation.  No guarding, or rebound.  No hepato-splenomegaly or mass.  Bowel sounds were normally active.  No hernia. Back:  No CVA tenderness.  Skin:  Clear, warm and dry.  Labs:   Results for orders placed during the hospital encounter of 03/02/12  POCT URINALYSIS DIP (DEVICE)      Component Value Range   Glucose, UA NEGATIVE  NEGATIVE mg/dL   Bilirubin Urine NEGATIVE  NEGATIVE   Ketones, ur NEGATIVE  NEGATIVE mg/dL   Specific Gravity, Urine 1.015  1.005 - 1.030   Hgb urine dipstick MODERATE (*) NEGATIVE   pH 6.5  5.0 - 8.0   Protein, ur NEGATIVE  NEGATIVE mg/dL   Urobilinogen, UA 0.2  0.0 - 1.0 mg/dL   Nitrite NEGATIVE  NEGATIVE   Leukocytes, UA SMALL (*) NEGATIVE     Other Labs Obtained at Urgent Care Center:  A urine culture was obtained.  Results are pending at this time and we will call about any positive results.  Assessment: The encounter diagnosis was UTI (lower urinary tract infection).  It appears that she has another urinary tract infection. It's a little difficult to know what was prescribed. She had nausea with Macrobid, nausea and bacterial resistance with Cipro and she took a course of cephalexin and the symptoms came right back again. I would try Omnicef which is equivalent to a third-generation cephalosporin for 10 days. We have obtained a culture and is pending. Identifies her to followup with  Dr. Senaida Ores and she also has an appointment with a urologist as well.  Plan:   1.  The following meds were prescribed:   New Prescriptions   CEFDINIR (OMNICEF) 300 MG CAPSULE    Take 1 capsule (300 mg total) by mouth 2 (two) times daily.   ONDANSETRON (ZOFRAN ODT) 8 MG DISINTEGRATING TABLET    Take 1 tablet (8 mg total) by mouth every 8 (eight) hours as needed for nausea.   2.  The patient was instructed in symptomatic care and handouts were given. 3.  The patient was told to return  if becoming worse in any way, if no better in 3 or 4 days, and given some red flag symptoms that would indicate earlier return. 4.  The patient was told to avoid intercourse for 10 days, get extra fluids, and return for a follow up with her primary care doctor at the completion of treatment for a repeat UA and culture.     Reuben Likes, MD 03/02/12 [phone]

## 2012-03-02 NOTE — ED Notes (Signed)
C/o urinary frequency, urgency, odor and burning with urination for 2 days.  States recently had kidney infection.

## 2012-03-04 ENCOUNTER — Telehealth (HOSPITAL_COMMUNITY): Payer: Self-pay | Admitting: *Deleted

## 2012-03-04 LAB — URINE CULTURE

## 2012-03-04 NOTE — ED Notes (Signed)
Urine culture:  50,000 colonies E. Coli. Pt. treated with Cefdinir.  Discussed with Dr. Lorenz Coaster.  He said it is a 3rd round Cephalosporin and should be OK because it is sensitive to Cefazolin.  Call for clinical improvement and tell pt.to be sure to f/u if not better. I called pt.  Pt. verified x 2 and given result.  Pt. told she was adequately treated. I asked how she is doing. She said she is doing OK but medicine is giving her a headache and she feels weak and dizzy. I told her that could be from the infection and there was not anything else, to switch her too.  Instructed pt. to drink plenty of water to flush out the infection and flush the antibiotics through her system.  Pt. told to f/u with her PCP if not better.  She said she has an appointment next Tues. with her doctor. Vassie Moselle 03/04/2012

## 2012-03-21 ENCOUNTER — Other Ambulatory Visit: Payer: Self-pay | Admitting: Internal Medicine

## 2012-04-17 ENCOUNTER — Emergency Department (INDEPENDENT_AMBULATORY_CARE_PROVIDER_SITE_OTHER): Payer: Medicare Other

## 2012-04-17 ENCOUNTER — Encounter (HOSPITAL_COMMUNITY): Payer: Self-pay | Admitting: Emergency Medicine

## 2012-04-17 ENCOUNTER — Emergency Department (HOSPITAL_COMMUNITY)
Admission: EM | Admit: 2012-04-17 | Discharge: 2012-04-17 | Disposition: A | Discharge status: Home / Self Care | Payer: Medicare Other | Source: Home / Self Care

## 2012-04-17 DIAGNOSIS — R1013 Epigastric pain: Secondary | ICD-10-CM

## 2012-04-17 LAB — POCT I-STAT, CHEM 8
BUN: 12 mg/dL (ref 6–23)
Calcium, Ion: 1.21 mmol/L (ref 1.13–1.30)
Chloride: 105 mEq/L (ref 96–112)

## 2012-04-17 LAB — POCT URINALYSIS DIP (DEVICE)
Bilirubin Urine: NEGATIVE
Ketones, ur: NEGATIVE mg/dL
Leukocytes, UA: NEGATIVE
pH: 7 (ref 5.0–8.0)

## 2012-04-17 MED ORDER — SUCRALFATE 1 G PO TABS: 1.0000 g | ORAL_TABLET | Freq: Four times a day (QID) | ORAL | Status: DC

## 2012-04-17 NOTE — ED Provider Notes (Signed)
Medical screening examination/treatment/procedure(s) were performed by non-physician practitioner and as supervising physician I was immediately available for consultation/collaboration.  Leslee Home, M.D.   Reuben Likes, MD 04/17/12 2129

## 2012-04-17 NOTE — ED Notes (Signed)
Patient made aware results were back and provider was made aware.

## 2012-04-17 NOTE — ED Provider Notes (Signed)
History     CSN: [id number]  Arrival date & time 04/17/12  1353   None     Chief Complaint  Patient presents with  . Abdominal Pain    severe epigastric pain x this a.m    (Consider location/radiation/quality/duration/timing/severity/associated sxs/prior treatment) HPI Comments: Onset of nonradiating midabdominal pain this AM. Occurs intermittently, crampy feeling. Associated with nausea , no vomiting. No chest pain or dyspnea. Hx of constipation that is ongoing. . Taking Miralax.  Patient is a 76 y.o. female presenting with abdominal pain.  Abdominal Pain The primary symptoms of the illness include abdominal pain, fatigue and nausea. The primary symptoms of the illness do not include fever, shortness of breath, diarrhea, hematemesis, dysuria or vaginal discharge. The current episode started 6 to 12 hours ago. The onset of the illness was sudden. The problem has not changed since onset. The patient states that she believes she is currently not pregnant. The patient has had a change in bowel habit. Additional symptoms associated with the illness include constipation and back pain. Symptoms associated with the illness do not include chills, diaphoresis or heartburn. Significant associated medical issues include GERD.    Past Medical History  Diagnosis Date  . Hyperlipemia   . Reflux esophagitis   . Palpitations   . Dysmetabolic syndrome   . Hypertension   . IBS (irritable bowel syndrome)   . Restless leg syndrome   . Neoplasm of pituitary gland     Dr.Nudelman   . Scoliosis     Spinal Stenosis  . COPD (chronic obstructive pulmonary disease)   . Pituitary adenoma 1991    Dr.Love and Dr.Nudelman   . Shingles outbreak   . Diabetes mellitus   . Blood transfusion     Past Surgical History  Procedure Date  . Partial hysterectomy     for dysfunctional bleeding   . Breast lumpectomy     4 lumps removed from breast, all benign   . Finger amputation     related to work injury     . Esophageal dilation 01/2003  . Ankle fracture surgery     left ankle  . Knee surgery 08/2007     complicated by difficult resuscitation post anesthesia   . G2 p2   . Pituitary adenoma     Dr. Sandria Manly  . Laparoscopic cholecystectomy 03/2011    [place], [place]    Family History  Problem Relation Age of Onset  . Bone cancer Father   . Prostate cancer Father   . Diabetes Father   . Coronary artery disease Father   . Cancer Father     bone, prostate  . Alzheimer's disease Mother   . Arthritis Mother   . Mental illness Mother     alzheimers  . Alzheimer's disease Maternal Aunt   . Mental illness Maternal Aunt     alzheimers  . Mitral valve prolapse Sister   . Hypertension Sister   . Diabetes Sister   . Stroke Paternal Grandmother   . Osteoporosis Sister   . Mental illness Maternal Grandmother     alzheimers    History  Substance Use Topics  . Smoking status: Former Smoker    Quit date: 08/10/1978  . Smokeless tobacco: Never Used  . Alcohol Use: No    OB History    Grav Para Term Preterm Abortions TAB SAB Ect Mult Living                  Review of Systems  Constitutional: Positive for fatigue. Negative for fever, chills and diaphoresis.  Respiratory: Negative for shortness of breath.   Gastrointestinal: Positive for nausea, abdominal pain and constipation. Negative for heartburn, diarrhea and hematemesis.  Genitourinary: Negative for dysuria and vaginal discharge.  Musculoskeletal: Positive for back pain.    Allergies  Morphine and related; Codeine; Ipratropium-albuterol; Niaspan; Pramipexole dihydrochloride; Pregabalin; Propoxyphene-acetaminophen; Simvastatin; and Sulfonamide derivatives  Home Medications   Current Outpatient Rx  Name Route Sig Dispense Refill  . CLONAZEPAM 0.5 MG PO TABS Oral Take 1 tablet (0.5 mg total) by mouth as directed. 1-2 by mouth at bedtime as needed 60 tablet 1  . CYCLOBENZAPRINE HCL 10 MG PO TABS Oral Take 10 mg by mouth 3  (three) times daily as needed.    [name] DIOVAN 160 MG PO TABS  TAKE 1 TABLET BY MOUTH EVERY DAY 30 tablet 5  . PROZAC PO Oral Take by mouth.    [name] GABAPENTIN 300 MG PO CAPS Oral Take 300 mg by mouth daily as needed.    [name] GLUCOSE BLOOD VI STRP  Check blood sugar as directed 100 each 3  . FISH OIL 300 MG PO CAPS Oral Take 900 mg by mouth daily.    Letta Pate DELICA LANCETS MISC  Check blood sugar as directed 100 each 3  . PROMETHAZINE HCL 12.5 MG RE SUPP  Unwrap in place 1 per rectum every 4-6 hours when necessary nausea, vomiting, or headache 12 each 0  . SUCRALFATE 1 G PO TABS Oral Take 1 tablet (1 g total) by mouth 4 (four) times daily. 16 tablet 1  . TIOTROPIUM BROMIDE MONOHYDRATE 18 MCG IN CAPS Inhalation Place 1 capsule (18 mcg total) into inhaler and inhale daily. 30 capsule 5  . TRAMADOL HCL 50 MG PO TABS Oral Take 50 mg by mouth every 6 (six) hours as needed.    [name] VERAPAMIL HCL 80 MG PO TABS Oral Take 1 tablet (80 mg total) by mouth 3 (three) times daily. 90 tablet 2  . VITAMIN B-12 500 MCG PO TABS Oral Take 500 mcg by mouth daily.        BP 186/94  Pulse 63  Temp 97.7 F (36.5 C) (Oral)  Resp 18  SpO2 93%  Physical Exam  Constitutional: She is oriented to person, place, and time. She appears well-developed and well-nourished. No distress.  Neck: Normal range of motion. Neck supple.  Cardiovascular: Normal rate, regular rhythm and normal heart sounds.   Pulmonary/Chest: Effort normal and breath sounds normal. No respiratory distress. She has no wheezes.  Abdominal: Soft. She exhibits no distension and no mass. There is tenderness. There is no rebound and no guarding.       Tenderness in small area about 10cm superior to umbilicus. No overlying skin changes.   Musculoskeletal: Normal range of motion.  Neurological: She is alert and oriented to person, place, and time.  Skin: Skin is warm and dry.  Psychiatric: She has a normal mood and affect.    ED Course  Procedures (including  critical care time)  Labs Reviewed  POCT I-STAT, CHEM 8 - Abnormal; Notable for the following:    Hemoglobin 16.0 (*)     HCT 47.0 (*)     All other components within normal limits  POCT URINALYSIS DIP (DEVICE)   Dg Abd 1 View  04/17/2012  *RADIOLOGY REPORT*  Clinical Data: Abdominal pain.  ABDOMEN - 1 VIEW  Comparison: None  Findings: The bowel gas pattern is unremarkable.  Moderate  stool in the right colon.  No findings for small bowel obstruction or free air.  The soft tissue shadows are maintained.  No worrisome calcifications.  The bony structures are intact.  IMPRESSION: No plain film findings for an acute abdominal process.   Original Report Authenticated By: P. Loralie Champagne, M.D.      1. Epigastric abdominal pain       MDM   Results for orders placed during the hospital encounter of 04/17/12  POCT URINALYSIS DIP (DEVICE)      Component Value Range   Glucose, UA NEGATIVE  NEGATIVE mg/dL   Bilirubin Urine NEGATIVE  NEGATIVE   Ketones, ur NEGATIVE  NEGATIVE mg/dL   Specific Gravity, Urine 1.010  1.005 - 1.030   Hgb urine dipstick NEGATIVE  NEGATIVE   pH 7.0  5.0 - 8.0   Protein, ur NEGATIVE  NEGATIVE mg/dL   Urobilinogen, UA 0.2  0.0 - 1.0 mg/dL   Nitrite NEGATIVE  NEGATIVE   Leukocytes, UA NEGATIVE  NEGATIVE  POCT I-STAT, CHEM 8      Component Value Range   Sodium 142  135 - 145 mEq/L   Potassium 4.3  3.5 - 5.1 mEq/L   Chloride 105  96 - 112 mEq/L   BUN 12  6 - 23 mg/dL   Creatinine, Ser 2.84  0.50 - 1.10 mg/dL   Glucose, Bld 81  70 - 99 mg/dL   Calcium, Ion 1.32  4.40 - 1.30 mmol/L   TCO2 26  0 - 100 mmol/L   Hemoglobin 16.0 (*) 12.0 - 15.0 g/dL   HCT 10.2 (*) 72.5 - 36.6 %     Dg Abd 1 View  04/17/2012  *RADIOLOGY REPORT*  Clinical Data: Abdominal pain.  ABDOMEN - 1 VIEW  Comparison: None  Findings: The bowel gas pattern is unremarkable.  Moderate stool in the right colon.  No findings for small bowel obstruction or free air.  The soft tissue shadows are  maintained.  No worrisome calcifications.  The bony structures are intact.  IMPRESSION: No plain film findings for an acute abdominal process.   Original Report Authenticated By: P. Loralie Champagne, M.D.    Carafate 1gm qid Return in 2 d for eval At the time of discharge the pt tells me that her pain had stopped around 1PM today and not returned since. This is different than her initial complaint of abd pain off and on all day long.       Hayden Rasmussen, NP 04/17/12 2119

## 2012-04-17 NOTE — ED Notes (Signed)
Waiting for discharge papers

## 2012-04-17 NOTE — ED Notes (Addendum)
Pt states that she is having severe epigastric pain this a.m. That radiates around back. Had gall bladder removed last year, takes miralax daily. Hx of stenosis.  Pt unable to void, drinking water.

## 2012-05-02 ENCOUNTER — Other Ambulatory Visit: Payer: Self-pay | Admitting: Internal Medicine

## 2012-05-02 DIAGNOSIS — Z1231 Encounter for screening mammogram for malignant neoplasm of breast: Secondary | ICD-10-CM

## 2012-05-05 ENCOUNTER — Ambulatory Visit: Payer: Medicare Other

## 2012-05-23 ENCOUNTER — Ambulatory Visit
Admission: RE | Admit: 2012-05-23 | Discharge: 2012-05-23 | Disposition: A | Discharge status: Home / Self Care | Payer: Medicare Other | Source: Ambulatory Visit | Attending: Internal Medicine | Admitting: Internal Medicine

## 2012-05-23 DIAGNOSIS — Z1231 Encounter for screening mammogram for malignant neoplasm of breast: Secondary | ICD-10-CM

## 2012-08-10 HISTORY — PX: URETHRAL DILATION: SUR417

## 2012-08-10 HISTORY — PX: UPPER GI ENDOSCOPY: SHX6162

## 2012-08-10 HISTORY — PX: CYSTOSCOPY: SUR368

## 2012-08-10 HISTORY — PX: COLONOSCOPY: SHX174

## 2012-08-13 ENCOUNTER — Other Ambulatory Visit: Payer: Self-pay | Admitting: Internal Medicine

## 2012-08-31 ENCOUNTER — Other Ambulatory Visit: Payer: Self-pay | Admitting: Internal Medicine

## 2012-08-31 NOTE — Telephone Encounter (Signed)
Patient aware Controlled Substance Contract to be sign and rx to be picked up   

## 2012-10-05 ENCOUNTER — Encounter: Payer: Self-pay | Admitting: Internal Medicine

## 2012-11-14 ENCOUNTER — Ambulatory Visit (INDEPENDENT_AMBULATORY_CARE_PROVIDER_SITE_OTHER): Payer: Medicare HMO | Admitting: Internal Medicine

## 2012-11-14 ENCOUNTER — Encounter: Payer: Self-pay | Admitting: Internal Medicine

## 2012-11-14 VITALS — BP 154/98 | HR 67 | Wt 192.0 lb

## 2012-11-14 DIAGNOSIS — M545 Low back pain, unspecified: Secondary | ICD-10-CM

## 2012-11-14 DIAGNOSIS — G4730 Sleep apnea, unspecified: Secondary | ICD-10-CM

## 2012-11-14 DIAGNOSIS — N390 Urinary tract infection, site not specified: Secondary | ICD-10-CM | POA: Insufficient documentation

## 2012-11-14 DIAGNOSIS — M48061 Spinal stenosis, lumbar region without neurogenic claudication: Secondary | ICD-10-CM

## 2012-11-14 LAB — POCT URINALYSIS DIPSTICK
Bilirubin, UA: NEGATIVE
Ketones, UA: NEGATIVE
Leukocytes, UA: NEGATIVE
Protein, UA: NEGATIVE
Spec Grav, UA: <=1.005
pH, UA: 6

## 2012-11-14 NOTE — Patient Instructions (Addendum)
Please sign a release of records to [facility] for records related to possible sleep apnea suggested @ colonoscopy. Review and correct the record as indicated. Please share record with all medical staff seen.

## 2012-11-14 NOTE — Progress Notes (Signed)
Subjective:    Patient ID: Cheryl Brown, female    DOB: 1936/01/10, 77 y.o.   MRN: [id number]  HPI She has had mid LS aching for at least a year. It is intermittent and worse sitting watching TV or standing for periods of time. Gabapentin 300 mg has been of some benefit. In possibly 2008 she was diagnosed as having spinal stenosis by Cheryl Brown, her orthopedist. Associated signs and symptoms include numbness and tingling in the L foot & minimally in R foot . Limb weakness has been present  She was recently evaluated for RUQ abdominal pain and recurrent urinary tract infections. Her chart was updated pertaining to these evaluations.Diverticulosis & benign polyp on Endo & Colonoscopy 2014, [facility]. Her urologist dilated the urethra; she believes that this may need to be repeated. He also performed a cystoscopy. He has seen her in referral from her gynecologist, Cheryl Brown.   Review of Systems  Constitutional: No fever but light chills & "hot flashes".No unexplained weight loss HEENT: No diplopia, blurred vision, or loss of vision. No hearing loss or tinnitus GI: No melena or rectal bleeding Pulmonary: She describes intermittent orthopnea at rest. She also has dyspnea on exertion with incline. She was told that she may have increased risk for sleep apnea and based on clinical observation at the time of colonoscopy. No PND GU: No hematuria, pyuria, or dysuria .  MS: No joint stiffness;redness Heme/Lymph:Easy bruising & bleeding         Objective:   Physical Exam  Gen.: Well-nourished in appearance. Alert, appropriate and cooperative throughout exam.  Head: Normocephalic without obvious abnormalities  Eyes: No corneal or conjunctival inflammation noted.No icterus. Neck: No deformities, masses, or tenderness noted.  Thyroid normal. Lungs: Normal respiratory effort; chest expands symmetrically. Lungs are clear to auscultation without rales, wheezes, or increased work of  breathing. Heart: Normal rate and rhythm. Normal S1 and S2. No gallop, click, or rub. Grade 1/6 systolic murmur Abdomen: Bowel sounds normal; abdomen soft and nontender. No masses, organomegaly or hernias noted.                                  Musculoskeletal/extremities: No clubbing, cyanosis,or edema. Post traumatic finger  deformities noted. Range of motion normal .Tone & strength  normal. Nail health good. Able to lie down & sit up w/o help. Negative SLR bilaterally Vascular: Carotid, radial artery, dorsalis pedis and  posterior tibial pulses are full and equal. No bruits present. Neurologic: Alert and oriented x3. Deep tendon reflexes symmetrical and normal. Gait including heel & toe walking normal.        Skin: Intact without suspicious lesions or rashes. Lymph: No cervical, axillary lymphadenopathy present. Psych: Mood and affect are normal. Normally interactive                                                                                     Assessment & Plan:  #1 back pain with some radicular component in the left foot. This is in the context of past history of spinal stenosis. Her history certainly suggests this. Referral to  a nonoperative back specialist would be recommended.  #2 possible sleep apnea  #3 intermittent orthopnea.  Plan: See orders and recommendations

## 2012-11-18 ENCOUNTER — Other Ambulatory Visit: Payer: Self-pay | Admitting: Internal Medicine

## 2012-12-01 ENCOUNTER — Ambulatory Visit (INDEPENDENT_AMBULATORY_CARE_PROVIDER_SITE_OTHER): Payer: Medicare HMO | Admitting: Internal Medicine

## 2012-12-01 ENCOUNTER — Encounter: Payer: Self-pay | Admitting: Internal Medicine

## 2012-12-01 VITALS — BP 118/80 | HR 58 | Ht 62.0 in | Wt 191.4 lb

## 2012-12-01 DIAGNOSIS — G4700 Insomnia, unspecified: Secondary | ICD-10-CM

## 2012-12-01 DIAGNOSIS — G4733 Obstructive sleep apnea (adult) (pediatric): Secondary | ICD-10-CM

## 2012-12-01 NOTE — Patient Instructions (Addendum)
Order- schedule NPSG split protocol    Dx OSA  Please call as needed

## 2012-12-01 NOTE — Progress Notes (Signed)
12/01/12- 76 yoF  Pt. referred by Dr. Marga Melnick for problems falling and staying  asleep.  Pt. states she gets about  7 hours of sleep 2 to 3 times a week.  Hx of snoring. During colonoscopy she was noted to stop breathing Usually she falls asleep reasonably well, using occ clonazepam 0.5 mg,  but wakes frequently for no good reason. She is comfortable lying in bed. Sometimes back ache or feeling smothered lying in bed. She has been widowed 10 years with no witness of her sleep quality. Bedtime midnight, sleep latency 10-15 minutes, waking 3 or 4 times before up between 7 and 8 AM. Drowsy with TV but okay driving. Deviated septum repair. Diagnosed with hypertension, asthma with COPD, diabetes and allergy. She has a benign pituitary tumor which is just being watched. History of thyroid nodule but not hyperthyroidism. Had lost ends of right fingers to trauma in a cigarette making machine at work  Prior to Admission medications   Medication Sig Start Date End Date Taking? Authorizing Provider  clonazePAM (KLONOPIN) 0.5 MG tablet TAKE 1 TO 2 TABLETS BY MOUTH AT BEDTIME AS NEEDED 08/31/12  Yes Pecola Lawless, MD  DIOVAN 160 MG tablet TAKE 1 TABLET BY MOUTH EVERY DAY 11/18/12  Yes Pecola Lawless, MD  Fluticasone-Salmeterol (ADVAIR DISKUS) 100-50 MCG/DOSE AEPB Inhale 1 puff into the lungs as needed.   Yes Historical Provider, MD  gabapentin (NEURONTIN) 300 MG capsule Take 300 mg by mouth daily as needed. 12/22/11  Yes Lelon Perla, DO  glucose blood (ONE TOUCH ULTRA TEST) test strip Check blood sugar as directed 02/16/11  Yes Pecola Lawless, MD  [facility] LANCETS MISC Check blood sugar as directed 02/16/11  Yes Pecola Lawless, MD  polyethylene glycol powder (MIRALAX) powder Take 17 g by mouth. 2 capfuls daily per Urologist   Yes Historical Provider, MD  promethazine (PHENERGAN) 12.5 MG suppository Unwrap in place 1 per rectum every 4-6 hours when necessary nausea, vomiting, or headache 12/23/11   Yes Ward Givens, MD  vitamin B-12 (CYANOCOBALAMIN) 500 MCG tablet Take 500 mcg by mouth daily.     Yes Historical Provider, MD   Past Medical History  Diagnosis Date  . Hyperlipemia   . Reflux esophagitis   . Palpitations   . Dysmetabolic syndrome   . Hypertension   . IBS (irritable bowel syndrome)   . Restless leg syndrome   . Neoplasm of pituitary gland     Dr.Nudelman   . Scoliosis     Spinal Stenosis  . COPD (chronic obstructive pulmonary disease)   . Pituitary adenoma 1991    Dr.Love and Dr.Nudelman   . Shingles outbreak   . Diabetes mellitus   . Blood transfusion   . Recurrent UTI 2013-14    Dr. Cleatrice Burke and Dr. Senaida Ores   Past Surgical History  Procedure Laterality Date  . Partial hysterectomy      for dysfunctional bleeding   . Breast lumpectomy      4 lumps removed from breast, all benign   . Finger amputation      related to work injury   . Esophageal dilation  01/2003  . Ankle fracture surgery      left ankle  . Knee surgery  08/2007     complicated by difficult resuscitation post anesthesia   . G2 p2    . Pituitary adenoma      Dr. Sandria Manly  . Laparoscopic cholecystectomy  03/2011    [facility]  [place], [place]  . Cystoscopy  2014     Dr Cleatrice Burke  . Urethral dilation  2014  . Colonoscopy  2014    Tics, [facility]  . Upper gi endoscopy  2014    gastric polyp   Family History  Problem Relation Age of Onset  . Bone cancer Father   . Prostate cancer Father   . Diabetes Father   . Coronary artery disease Father   . Cancer Father     bone, prostate  . Alzheimer's disease Mother   . Arthritis Mother   . Mental illness Mother     alzheimers  . Alzheimer's disease Maternal Aunt   . Mental illness Maternal Aunt     alzheimers  . Mitral valve prolapse Sister   . Hypertension Sister   . Diabetes Sister   . Stroke Paternal Grandmother   . Osteoporosis Sister   . Mental illness Maternal Grandmother     alzheimers   History   Social History  . Marital  Status: Widowed    Spouse Name: N/A    Number of Children: 2  . Years of Education: N/A   Occupational History  . Retired    Social History Main Topics  . Smoking status: Former Smoker    Quit date: 08/10/1978  . Smokeless tobacco: Never Used  . Alcohol Use: No  . Drug Use: No  . Sexually Active: Not on file   Other Topics Concern  . Not on file   Social History Narrative  . No narrative on file   ROS-see HPI Constitutional:   No-   weight loss, night sweats, fevers, chills, fatigue, lassitude. HEENT:   +headaches, difficulty swallowing, tooth/dental problems, sore throat,       +  sneezing, itching, ear ache, nasal congestion, post nasal drip,  CV:  No-   chest pain, orthopnea, PND, swelling in lower extremities, anasarca,                                  dizziness, palpitations Resp: +  shortness of breath with exertion or at rest.              + productive cough, + non-productive cough,  No- coughing up of blood.              No-   change in color of mucus.  No- wheezing.   Skin: No-   rash or lesions. GI:  + heartburn, indigestion, +abdominal pain, nausea, vomiting, diarrhea,                 change in bowel habits, +loss of appetite GU: No-   dysuria, change in color of urine, no urgency or frequency.  No- flank pain. MS:  No-   joint pain or swelling.  No- decreased range of motion.  No- back pain. Neuro-     nothing unusual Psych:  No- change in mood or affect. No depression or anxiety.  No memory loss.  OBJ- Physical Exam General- Alert, Oriented, Affect-appropriate, Distress- none acute Skin- rash-none, lesions- none, excoriation- none Lymphadenopathy- none Head- atraumatic            Eyes- Gross vision intact, PERRLA, conjunctivae and secretions clear            Ears- Hearing, canals-normal            Nose- Clear, no-Septal dev, mucus, polyps, erosion, perforation  Throat- Mallampati III , mucosa clear , drainage- none, tonsils- atrophic.  Dentures Neck- flexible , trachea midline, no stridor , thyroid nl, carotid no bruit Chest - symmetrical excursion , unlabored           Heart/CV- RRR , no murmur , no gallop  , no rub, nl s1 s2                           - JVD- none , edema- none, stasis changes- none, varices- none           Lung- +distant but quiet, wheeze- none, cough- none , dullness-none, rub- none           Chest wall-  Abd- tender-no, distended-no, bowel sounds-present, HSM- no Br/ Gen/ Rectal- Not done, not indicated Extrem- cyanosis- none, clubbing, none, atrophy- none, strength- nl. Traumatic shortening fingers R hand Neuro- grossly intact to observation

## 2012-12-06 ENCOUNTER — Telehealth: Payer: Self-pay | Admitting: Internal Medicine

## 2012-12-06 NOTE — Telephone Encounter (Signed)
I will forward this message to CY, so that he will be aware of this information.

## 2012-12-06 NOTE — Telephone Encounter (Signed)
Noted  

## 2012-12-07 DIAGNOSIS — G4730 Sleep apnea, unspecified: Secondary | ICD-10-CM | POA: Insufficient documentation

## 2012-12-07 NOTE — Assessment & Plan Note (Signed)
This may be a good example of insomnia with sleep apnea. She falls asleep reasonably well with some Klonopin but can't stay asleep and it does snore. Plan-schedule split protocol sleep study as discussed with her today.

## 2012-12-08 ENCOUNTER — Telehealth: Payer: Self-pay | Admitting: Internal Medicine

## 2012-12-08 NOTE — Telephone Encounter (Signed)
Patient would like Dr. Alwyn Ren to know that she cancelled her sleep study and is not rescheduling at this time.

## 2012-12-08 NOTE — Telephone Encounter (Signed)
Appointment already cancelled per patient (see appointment history-epic), Dr.Hopper verbally informed

## 2012-12-21 ENCOUNTER — Encounter (HOSPITAL_BASED_OUTPATIENT_CLINIC_OR_DEPARTMENT_OTHER): Payer: Medicare HMO

## 2013-01-11 ENCOUNTER — Other Ambulatory Visit: Payer: Self-pay | Admitting: Internal Medicine

## 2013-01-19 ENCOUNTER — Ambulatory Visit: Payer: Medicare HMO | Admitting: Internal Medicine

## 2013-03-01 ENCOUNTER — Telehealth: Payer: Self-pay | Admitting: *Deleted

## 2013-03-01 NOTE — Telephone Encounter (Signed)
Pharmacy is requesting a refill of gabapentin 300mg  last ov 11-14-12 last refill 12-22-11  No count available. Please advise.

## 2013-03-01 NOTE — Telephone Encounter (Signed)
OK X 3 mos @ same schedule

## 2013-03-02 ENCOUNTER — Other Ambulatory Visit: Payer: Self-pay

## 2013-03-02 MED ORDER — GABAPENTIN 300 MG PO CAPS: 300.0000 mg | ORAL_CAPSULE | Freq: Every day | ORAL | Status: DC | PRN

## 2013-03-03 ENCOUNTER — Other Ambulatory Visit: Payer: Self-pay | Admitting: *Deleted

## 2013-03-29 ENCOUNTER — Other Ambulatory Visit: Payer: Self-pay | Admitting: Internal Medicine

## 2013-03-30 NOTE — Telephone Encounter (Signed)
Spoke with the pt and informed her that the refill request is approved, but she will need to come in for UDS.  Pt understood and agreed.  Rx was called into the pharmacy.//AB/CMA

## 2013-04-20 ENCOUNTER — Other Ambulatory Visit: Payer: Self-pay

## 2013-04-20 DIAGNOSIS — Z1231 Encounter for screening mammogram for malignant neoplasm of breast: Secondary | ICD-10-CM

## 2013-05-17 ENCOUNTER — Ambulatory Visit (INDEPENDENT_AMBULATORY_CARE_PROVIDER_SITE_OTHER): Payer: Medicare HMO

## 2013-05-17 DIAGNOSIS — Z23 Encounter for immunization: Secondary | ICD-10-CM

## 2013-05-25 ENCOUNTER — Ambulatory Visit
Admission: RE | Admit: 2013-05-25 | Discharge: 2013-05-25 | Disposition: A | Discharge status: Home / Self Care | Payer: Commercial Managed Care - HMO | Source: Ambulatory Visit

## 2013-05-25 DIAGNOSIS — Z1231 Encounter for screening mammogram for malignant neoplasm of breast: Secondary | ICD-10-CM

## 2013-07-12 ENCOUNTER — Encounter: Payer: Self-pay | Admitting: Internal Medicine

## 2013-07-16 ENCOUNTER — Other Ambulatory Visit: Payer: Self-pay | Admitting: Internal Medicine

## 2013-07-17 NOTE — Telephone Encounter (Signed)
clonazePAM (KLONOPIN) 0.5 MG tablet Last refill: 8.20.14, #60, 1 refill Last OV: 4.7.14 UDS up-to-date, low risk

## 2013-07-17 NOTE — Telephone Encounter (Signed)
OK X1 

## 2013-07-20 ENCOUNTER — Other Ambulatory Visit: Payer: Self-pay | Admitting: Internal Medicine

## 2013-07-20 NOTE — Telephone Encounter (Signed)
Rx sent to the pharmacy by e-script.  Pt needs office visit for BP check or CPE.//AB/CMA

## 2013-08-31 ENCOUNTER — Telehealth: Payer: Self-pay | Admitting: Internal Medicine

## 2013-08-31 NOTE — Telephone Encounter (Signed)
Please advise on dermatology referral

## 2013-08-31 NOTE — Telephone Encounter (Signed)
Message copied by Dorian Pod on Thu Aug 31, 2013 11:56 AM ------      Message from: Derrill Kay      Created: Mon Aug 28, 2013 12:35 PM      Regarding: [facility] Referral       [place],            Dr. Ledell Peoples dermatology office is needing a referral to see this patient. Patient says they told her to call our office to request one before scheduling her follow-up visit with them.             A[place]o ------

## 2013-08-31 NOTE — Telephone Encounter (Signed)
OK BUT a diagnosis or reason is indicated

## 2013-09-19 ENCOUNTER — Telehealth: Payer: Self-pay | Admitting: *Deleted

## 2013-09-19 NOTE — Telephone Encounter (Signed)
Dx: neoplasm uncertain etiology; referral OK

## 2013-09-19 NOTE — Telephone Encounter (Signed)
OK but need Dx for which Dr Allyson Sabal is seeing her

## 2013-09-19 NOTE — Addendum Note (Signed)
Addended by: Lowry Ram on: 09/19/2013 01:40 PM   Modules accepted: Orders

## 2013-09-19 NOTE — Telephone Encounter (Signed)
States she's needing to see him now for an unknown "place on her arm-psoriasis", etc.  States she has also seen him in the past for a history of skin cancer.

## 2013-09-19 NOTE — Telephone Encounter (Signed)
Patient phoned stating that her insurance company was requiring a referral order for her dermatologist, Dr. Acquanetta Chain (?).  States that she has been seeing him for years, but needs referral order.   CB# [phone]

## 2013-10-16 ENCOUNTER — Telehealth: Payer: Self-pay | Admitting: *Deleted

## 2013-10-16 MED ORDER — VALSARTAN 160 MG PO TABS: ORAL_TABLET | ORAL | Status: DC

## 2013-10-16 NOTE — Telephone Encounter (Signed)
Requesting Clonazepam 0.5mg  Take 1 or 2 tablets by mouth at bedtime as needed. Last refill:08-26-13;#60,0 Last OV:11-09-12-pt is due office visit. UDS:04-03-13-low risk Please advise.//AB/CMA   Rx for Diovan 160mg  filled and sent to the pharmacy by e-script.//AB/CMA

## 2013-10-16 NOTE — Telephone Encounter (Signed)
OK #60 

## 2013-10-17 MED ORDER — CLONAZEPAM 0.5 MG PO TABS: ORAL_TABLET | ORAL | Status: DC

## 2013-10-17 NOTE — Addendum Note (Signed)
Addended by: Earnstine Regal on: 10/17/2013 11:37 AM   Modules accepted: Orders

## 2013-10-17 NOTE — Telephone Encounter (Signed)
Called clonazepam into pharmacy spoke with Jim/pharmacist. Updated EPIC...Johny Chess

## 2013-11-16 ENCOUNTER — Other Ambulatory Visit: Payer: Self-pay | Admitting: Internal Medicine

## 2013-11-16 ENCOUNTER — Other Ambulatory Visit (INDEPENDENT_AMBULATORY_CARE_PROVIDER_SITE_OTHER): Payer: Commercial Managed Care - HMO

## 2013-11-16 ENCOUNTER — Ambulatory Visit (INDEPENDENT_AMBULATORY_CARE_PROVIDER_SITE_OTHER): Payer: Commercial Managed Care - HMO | Admitting: Internal Medicine

## 2013-11-16 ENCOUNTER — Encounter: Payer: Self-pay | Admitting: Internal Medicine

## 2013-11-16 VITALS — BP 140/90 | HR 64 | Temp 97.4°F | Resp 14 | Ht 62.5 in | Wt 176.0 lb

## 2013-11-16 DIAGNOSIS — T148XXA Other injury of unspecified body region, initial encounter: Secondary | ICD-10-CM

## 2013-11-16 DIAGNOSIS — E559 Vitamin D deficiency, unspecified: Secondary | ICD-10-CM

## 2013-11-16 DIAGNOSIS — I10 Essential (primary) hypertension: Secondary | ICD-10-CM

## 2013-11-16 DIAGNOSIS — E782 Mixed hyperlipidemia: Secondary | ICD-10-CM

## 2013-11-16 DIAGNOSIS — E119 Type 2 diabetes mellitus without complications: Secondary | ICD-10-CM

## 2013-11-16 LAB — LIPID PANEL
Cholesterol: 212 mg/dL — ABNORMAL HIGH (ref 0–200)
HDL: 53.8 mg/dL (ref >39.00)
LDL Cholesterol: 132 mg/dL — ABNORMAL HIGH (ref 0–99)
TRIGLYCERIDES: 131 mg/dL (ref 0.0–149.0)
Total CHOL/HDL Ratio: 4
VLDL: 26.2 mg/dL (ref 0.0–40.0)

## 2013-11-16 LAB — CBC WITH DIFFERENTIAL/PLATELET
BASOS PCT: 0.8 % (ref 0.0–3.0)
Basophils Absolute: 0.1 10*3/uL (ref 0.0–0.1)
EOS PCT: 2.8 % (ref 0.0–5.0)
Eosinophils Absolute: 0.2 10*3/uL (ref 0.0–0.7)
HEMATOCRIT: 44.9 % (ref 36.0–46.0)
HEMOGLOBIN: 15.1 g/dL — AB (ref 12.0–15.0)
LYMPHS ABS: 1.1 10*3/uL (ref 0.7–4.0)
Lymphocytes Relative: 17.4 % (ref 12.0–46.0)
MCHC: 33.7 g/dL (ref 30.0–36.0)
MCV: 93.4 fl (ref 78.0–100.0)
MONO ABS: 0.5 10*3/uL (ref 0.1–1.0)
MONOS PCT: 8.3 % (ref 3.0–12.0)
NEUTROS ABS: 4.4 10*3/uL (ref 1.4–7.7)
Neutrophils Relative %: 70.7 % (ref 43.0–77.0)
PLATELETS: 235 10*3/uL (ref 150.0–400.0)
RBC: 4.81 Mil/uL (ref 3.87–5.11)
RDW: 13.1 % (ref 11.5–14.6)
WBC: 6.2 10*3/uL (ref 4.5–10.5)

## 2013-11-16 LAB — HEMOGLOBIN A1C: HEMOGLOBIN A1C: 6.2 % (ref 4.6–6.5)

## 2013-11-16 LAB — HEPATIC FUNCTION PANEL
ALBUMIN: 4.2 g/dL (ref 3.5–5.2)
ALK PHOS: 68 U/L (ref 39–117)
ALT: 12 U/L (ref 0–35)
AST: 22 U/L (ref 0–37)
BILIRUBIN DIRECT: 0.2 mg/dL (ref 0.0–0.3)
Total Bilirubin: 1.2 mg/dL (ref 0.3–1.2)
Total Protein: 6.8 g/dL (ref 6.0–8.3)

## 2013-11-16 LAB — BASIC METABOLIC PANEL
BUN: 15 mg/dL (ref 6–23)
CALCIUM: 9.4 mg/dL (ref 8.4–10.5)
CO2: 28 mEq/L (ref 19–32)
Chloride: 106 mEq/L (ref 96–112)
Creatinine, Ser: 0.6 mg/dL (ref 0.4–1.2)
GFR: 96.02 mL/min (ref >60.00)
Glucose, Bld: 79 mg/dL (ref 70–99)
POTASSIUM: 4.2 meq/L (ref 3.5–5.1)
SODIUM: 141 meq/L (ref 135–145)

## 2013-11-16 LAB — MICROALBUMIN / CREATININE URINE RATIO
Creatinine,U: 168.6 mg/dL
Microalb Creat Ratio: 0.5 mg/g (ref 0.0–30.0)
Microalb, Ur: 0.8 mg/dL (ref 0.0–1.9)

## 2013-11-16 LAB — TSH: TSH: 0.59 u[IU]/mL (ref 0.35–5.50)

## 2013-11-16 NOTE — Assessment & Plan Note (Signed)
Blood pressure goals reviewed. BMET 

## 2013-11-16 NOTE — Assessment & Plan Note (Signed)
BMET,A1c & urine microalbumin  

## 2013-11-16 NOTE — Progress Notes (Signed)
Subjective:    Patient ID: [name], female    DOB: 1935-11-02, 78 y.o.   MRN: [id number]  HPI  HYPERTENSION: Disease Monitoring: Blood pressure range/ average : no monitor Medication Compliance:yes  FASTING HYPERGLYCEMIA  :  A1c 6.5% on 08/21/11 FBS range/average: no monitor Highest 2 hr post meal glucose: no monitor Medication compliance:no meds Hypoglycemia:no Ophthamology care:due Podiatry care:not UTD  HYPERLIPIDEMIA: Disease Monitoring: Medication Compliance:intolerant to statin     Review of Systems  Chest pain, palpitations:no      Dyspnea:yes Edema:yes post prolonged standing Claudication: no Lightheadedness,Syncope:occasional when driving Weight gain/loss: down 5-10 # with walking 60 min 5X/week Polyuria/phagia/dipsia: no     Blurred vision /diplopia/lossof vision: no Limb numbness/tingling/burning:in feet & legs Non healing skin lesions:no Abd pain, bowel changes: no Myalgias: no Memory loss:some; sister has Alsheimer's Heme: easy bruising       Objective:   Physical Exam  Gen.: Healthy and well-nourished in appearance. Alert, appropriate and cooperative throughout exam. Appears younger than stated age  Head: Normocephalic without obvious abnormalities  Eyes: No corneal or conjunctival inflammation noted. Pupils equal round reactive to light and accommodation. Extraocular motion intact. Fundal exam is benign without hemorrhages, exudate, papilledema.   Ears: External  ear exam reveals no significant lesions or deformities.  Hearing is grossly normal bilaterally. Nose: External nasal exam reveals no deformity or inflammation. Nasal mucosa are pink and moist. No lesions or exudates noted.   Mouth: Oral mucosa and oropharynx reveal no lesions or exudates. Teeth in good repair. Neck: No deformities, masses, or tenderness noted. Range of motion & Thyroid normal Lungs: Normal respiratory effort; chest expands symmetrically. Lungs are clear to  auscultation without rales, wheezes, or increased work of breathing. Heart: Normal rate and rhythm. Normal S1 and S2. No gallop, click, or rub. Grade 4/6-9 over 6  systolic murmur. Abdomen: Bowel sounds normal; abdomen soft and nontender. No masses, organomegaly or hernias noted.Dullness BUQ Genitalia:  FEMALE:Genitalia normal except for left varices. Prostate is normal without enlargement, asymmetry, nodularity, or induration   OR FEMALE: as per Gyn                                  Musculoskeletal/extremities: No deformity or scoliosis noted of  the thoracic or lumbar spine.    No clubbing, cyanosis, or edema. Amputations RUE noted. Range of motion normal .Tone & strength normal. Hand joints normal other than post traumatic changes  Fingernail / toenail health good. Able to lie down & sit up w/o help. Negative SLR bilaterally Vascular: Carotid, radial artery, dorsalis pedis and  posterior tibial pulses are full and equal. No bruits present. Neurologic: Alert and oriented x3. Deep tendon reflexes symmetrical and normal.  Gait normal  Skin: Intact without suspicious lesions or rashes. Lymph: No cervical, axillarl lymphadenopathy present. Psych: Mood and affect are normal. Normally interactive                                                                                        Assessment & Plan:  See Current Assessment & Plan in  Problem List under specific Diagnosis

## 2013-11-16 NOTE — Patient Instructions (Signed)

## 2013-11-16 NOTE — Assessment & Plan Note (Signed)
Vit D level.

## 2013-11-16 NOTE — Assessment & Plan Note (Signed)
Lipids, LFTs, TSH  

## 2013-11-16 NOTE — Assessment & Plan Note (Signed)
CBC & dif 

## 2013-11-16 NOTE — Progress Notes (Signed)
Pre visit review using our clinic review tool, if applicable. No additional management support is needed unless otherwise documented below in the visit note. 

## 2013-11-23 LAB — VITAMIN D 1,25 DIHYDROXY
VITAMIN D 1, 25 (OH) TOTAL: 59 pg/mL (ref 18–72)
Vitamin D2 1, 25 (OH)2: <8 pg/mL
Vitamin D3 1, 25 (OH)2: 59 pg/mL

## 2013-11-24 ENCOUNTER — Other Ambulatory Visit: Payer: Self-pay

## 2013-11-24 MED ORDER — VALSARTAN 160 MG PO TABS: ORAL_TABLET | ORAL | Status: DC

## 2014-01-16 ENCOUNTER — Ambulatory Visit (INDEPENDENT_AMBULATORY_CARE_PROVIDER_SITE_OTHER): Payer: Commercial Managed Care - HMO | Admitting: Internal Medicine

## 2014-01-16 ENCOUNTER — Encounter: Payer: Self-pay | Admitting: Internal Medicine

## 2014-01-16 VITALS — BP 140/80 | HR 71 | Temp 98.2°F | Wt 184.8 lb

## 2014-01-16 DIAGNOSIS — J209 Acute bronchitis, unspecified: Secondary | ICD-10-CM

## 2014-01-16 MED ORDER — AZITHROMYCIN 250 MG PO TABS
ORAL_TABLET | ORAL | Status: DC
Start: 2014-01-16 — End: 2014-08-17

## 2014-01-16 NOTE — Progress Notes (Signed)
   Subjective:    Patient ID: Cheryl Brown, female    DOB: Dec 09, 1935, 78 y.o.   MRN: [id number]  HPI  She's had symptoms over past week; initially as sore throat, rhinitis, and head congestion. This was followed by cough productive of yellow/green sputum. She also had associated sneezing and sweats.  Her chest symptoms are now more significant than the head symptoms.  All are improving with Robitussin DM and Tylenol. Also Flonase has been of benefit  She does have some frontal sinus pressure without pain. The throat is scratchy.  Intermittently she had some ear pressure which is mild & greater on the right than the left  She has had some wheezing and shortness of breath.     Review of Systems  She denies facial pain, dental pain, nasal purulence, otic pain, otic discharge.  She is not having fever or chills.       Objective:   Physical Exam Positive or  pertinent findings include upper plate and lower partial. There is a deficit in the right tympanic membrane suggesting remote rupture which is healed. There's an isolated inspiratory pop in the right upper lobe anteriorly.   General appearance:well nourished; no acute distress or increased work of breathing is present.  No  lymphadenopathy about the head, neck, or axilla noted.   Eyes: No conjunctival inflammation or lid edema is present. There is no scleral icterus.  Ears:  External ear exam shows no significant lesions or deformities. No bulging, retraction, inflammation or discharge.  Nose:  External nasal examination shows no deformity or inflammation. Nasal mucosa are dry without lesions or exudates. No septal dislocation or deviation.No obstruction to airflow.   Oral exam:  lips and gums are healthy appearing.There is no oropharyngeal erythema or exudate noted.   Neck:  No deformities,  masses, or tenderness noted.       Heart:  Normal rate and regular rhythm. S1 and S2 normal without gallop, murmur, click,  rub or other extra sounds.   Lungs:No increased work of breathing.    Extremities:  No cyanosis, edema, or clubbing  noted    Skin: Warm & dry w/o jaundice or tenting.        Assessment & Plan:  #1 acute bronchitis with bronchospasm #2 rhinitis Plan: See orders and recommendations

## 2014-01-16 NOTE — Patient Instructions (Signed)

## 2014-01-16 NOTE — Progress Notes (Signed)
Pre visit review using our clinic review tool, if applicable. No additional management support is needed unless otherwise documented below in the visit note. 

## 2014-01-29 ENCOUNTER — Other Ambulatory Visit: Payer: Self-pay

## 2014-01-29 MED ORDER — CLONAZEPAM 0.5 MG PO TABS: ORAL_TABLET | ORAL | Status: DC

## 2014-01-29 NOTE — Telephone Encounter (Signed)
OK X1 

## 2014-01-29 NOTE — Telephone Encounter (Signed)
Last office visit 01/16/14

## 2014-05-08 ENCOUNTER — Other Ambulatory Visit: Payer: Self-pay

## 2014-05-08 DIAGNOSIS — Z1231 Encounter for screening mammogram for malignant neoplasm of breast: Secondary | ICD-10-CM

## 2014-05-11 ENCOUNTER — Other Ambulatory Visit: Payer: Self-pay

## 2014-05-11 MED ORDER — CLONAZEPAM 0.5 MG PO TABS: ORAL_TABLET | ORAL | Status: DC

## 2014-05-11 NOTE — Telephone Encounter (Signed)
So to clarify directions are you stating 1/2 - 1 tablet at bedtime as needed?

## 2014-05-11 NOTE — Telephone Encounter (Signed)
Correct : 1/2 -1 qhs prn only

## 2014-05-11 NOTE — Telephone Encounter (Signed)
Clonazepam has been called to pharmacy  

## 2014-05-11 NOTE — Telephone Encounter (Signed)
Last office visit on 6.9.15 Med last phoned to pharmacy on 6.22.15 #60 4.9.15 last labs

## 2014-05-11 NOTE — Telephone Encounter (Signed)
#  30  It is not wise to take this medicine at a dose of 2 at bedtime. It should not exceed 1 and preferably should be one half as needed only. This is based on the decrease in liver and kidney function as we age. It would markedly increase risk of falling and musculoskeletal or head injury

## 2014-05-22 ENCOUNTER — Encounter (INDEPENDENT_AMBULATORY_CARE_PROVIDER_SITE_OTHER): Payer: Self-pay

## 2014-05-22 ENCOUNTER — Ambulatory Visit
Admission: RE | Admit: 2014-05-22 | Discharge: 2014-05-22 | Disposition: A | Discharge status: Home / Self Care | Payer: Commercial Managed Care - HMO | Source: Ambulatory Visit

## 2014-05-22 DIAGNOSIS — Z1231 Encounter for screening mammogram for malignant neoplasm of breast: Secondary | ICD-10-CM

## 2014-05-23 ENCOUNTER — Ambulatory Visit (INDEPENDENT_AMBULATORY_CARE_PROVIDER_SITE_OTHER): Payer: Commercial Managed Care - HMO

## 2014-05-23 DIAGNOSIS — Z23 Encounter for immunization: Secondary | ICD-10-CM

## 2014-06-20 ENCOUNTER — Other Ambulatory Visit: Payer: Self-pay

## 2014-06-20 MED ORDER — FLUTICASONE-SALMETEROL 100-50 MCG/DOSE IN AEPB: 1.0000 | INHALATION_SPRAY | Freq: Two times a day (BID) | RESPIRATORY_TRACT | Status: DC

## 2014-06-28 ENCOUNTER — Ambulatory Visit: Payer: Commercial Managed Care - HMO | Admitting: Internal Medicine

## 2014-07-21 ENCOUNTER — Other Ambulatory Visit: Payer: Self-pay | Admitting: Internal Medicine

## 2014-08-08 ENCOUNTER — Other Ambulatory Visit: Payer: Self-pay

## 2014-08-08 MED ORDER — CLONAZEPAM 0.5 MG PO TABS: ORAL_TABLET | ORAL | Status: DC

## 2014-08-08 NOTE — Telephone Encounter (Signed)
Clonazepam has been called to [facility] [phone]

## 2014-08-08 NOTE — Telephone Encounter (Signed)
OK X1 

## 2014-08-17 ENCOUNTER — Encounter: Payer: Self-pay | Admitting: Internal Medicine

## 2014-08-17 ENCOUNTER — Ambulatory Visit (INDEPENDENT_AMBULATORY_CARE_PROVIDER_SITE_OTHER): Payer: PPO | Admitting: Internal Medicine

## 2014-08-17 ENCOUNTER — Other Ambulatory Visit (INDEPENDENT_AMBULATORY_CARE_PROVIDER_SITE_OTHER): Payer: PPO

## 2014-08-17 VITALS — BP 160/100 | HR 62 | Temp 98.1°F | Ht 62.5 in | Wt 193.4 lb

## 2014-08-17 DIAGNOSIS — G629 Polyneuropathy, unspecified: Secondary | ICD-10-CM

## 2014-08-17 DIAGNOSIS — E1140 Type 2 diabetes mellitus with diabetic neuropathy, unspecified: Secondary | ICD-10-CM

## 2014-08-17 DIAGNOSIS — I10 Essential (primary) hypertension: Secondary | ICD-10-CM

## 2014-08-17 DIAGNOSIS — T148 Other injury of unspecified body region: Secondary | ICD-10-CM

## 2014-08-17 DIAGNOSIS — T148XXA Other injury of unspecified body region, initial encounter: Secondary | ICD-10-CM

## 2014-08-17 DIAGNOSIS — M4806 Spinal stenosis, lumbar region: Secondary | ICD-10-CM

## 2014-08-17 DIAGNOSIS — M48061 Spinal stenosis, lumbar region without neurogenic claudication: Secondary | ICD-10-CM | POA: Insufficient documentation

## 2014-08-17 LAB — CBC WITH DIFFERENTIAL/PLATELET
BASOS ABS: 0.1 10*3/uL (ref 0.0–0.1)
Basophils Relative: 1.2 % (ref 0.0–3.0)
Eosinophils Absolute: 0.2 10*3/uL (ref 0.0–0.7)
Eosinophils Relative: 3.8 % (ref 0.0–5.0)
HCT: 44.1 % (ref 36.0–46.0)
HEMOGLOBIN: 14.6 g/dL (ref 12.0–15.0)
Lymphocytes Relative: 21.8 % (ref 12.0–46.0)
Lymphs Abs: 1.3 10*3/uL (ref 0.7–4.0)
MCHC: 33.1 g/dL (ref 30.0–36.0)
MCV: 93.3 fl (ref 78.0–100.0)
Monocytes Absolute: 0.5 10*3/uL (ref 0.1–1.0)
Monocytes Relative: 9.5 % (ref 3.0–12.0)
NEUTROS ABS: 3.7 10*3/uL (ref 1.4–7.7)
NEUTROS PCT: 63.7 % (ref 43.0–77.0)
PLATELETS: 225 10*3/uL (ref 150.0–400.0)
RBC: 4.73 Mil/uL (ref 3.87–5.11)
RDW: 12.8 % (ref 11.5–15.5)
WBC: 5.8 10*3/uL (ref 4.0–10.5)

## 2014-08-17 LAB — BASIC METABOLIC PANEL
BUN: 14 mg/dL (ref 6–23)
CO2: 26 mEq/L (ref 19–32)
CREATININE: 0.7 mg/dL (ref 0.4–1.2)
Calcium: 9.4 mg/dL (ref 8.4–10.5)
Chloride: 108 mEq/L (ref 96–112)
GFR: 92.49 mL/min (ref >60.00)
Glucose, Bld: 87 mg/dL (ref 70–99)
Potassium: 4 mEq/L (ref 3.5–5.1)
Sodium: 142 mEq/L (ref 135–145)

## 2014-08-17 LAB — TSH: TSH: 2.08 u[IU]/mL (ref 0.35–4.50)

## 2014-08-17 LAB — VITAMIN B12: Vitamin B-12: 746 pg/mL (ref 211–911)

## 2014-08-17 LAB — HEMOGLOBIN A1C: Hgb A1c MFr Bld: 7.2 % — ABNORMAL HIGH (ref 4.6–6.5)

## 2014-08-17 MED ORDER — AMLODIPINE BESYLATE 2.5 MG PO TABS: 2.5000 mg | ORAL_TABLET | Freq: Every day | ORAL | Status: DC

## 2014-08-17 NOTE — Patient Instructions (Addendum)
   Stop the clonazepam and monitor the bruising.  Take gabapentin at bedtime for the lower leg symptoms.  Minimal Blood Pressure Goal= AVERAGE < 140/90;  Ideal is an AVERAGE < 135/85. This AVERAGE should be calculated from @ least 5-7 BP readings taken @ different times of day on different days of week. You should not respond to isolated BP readings , but rather the AVERAGE for that week .Please bring your  blood pressure cuff to office visits to verify that it is reliable.It  can also be checked against the blood pressure device at the pharmacy. Finger or wrist cuffs are not dependable; an arm cuff is.  Your next office appointment will be determined based upon review of your pending labs. Those instructions will be transmitted to you  by mail  Followup as needed for your acute issue. Please report any significant change in your symptoms.

## 2014-08-17 NOTE — Progress Notes (Signed)
Pre visit review using our clinic review tool, if applicable. No additional management support is needed unless otherwise documented below in the visit note. 

## 2014-08-17 NOTE — Progress Notes (Signed)
   Subjective:    Patient ID: Cheryl Brown, female    DOB: January 30, 1936, 79 y.o.   MRN: [id number]  HPI  She has multiple concerns at this time  She describes numbness and tingling in both lower extremities anterior and posteriorly from the knees down with associated pain in her feet.  Clonazepam has helped but she feels it is causing bruising.  She has had urinary incontinence & saw a urologic specialist. He put her on MiraLAX (she is absolutely sure ) stating that the large bladder was causing constipation.  She also describes chills and sweats  She is not monitoring her blood pressure. She does eat salt. She questions whether the Diovan could be causing some of her leg symptoms  She also questions whether her sugar may be up as a factor.   She does have a past history of spinal stenosis and was treated by [place] [facility] [place] that therapy was interrupted when her first husband became ill.  Review of Systems Epistaxis, hemoptysis, hematuria, melena, or rectal bleeding denied. No unexplained weight loss, significant dyspepsia,dysphagia, or abdominal pain.  There is no abnormal bleeding, or difficulty stopping bleeding with injury.     Objective:   Physical Exam Pertinent or positive findings include: She has amputations of digits of the right hand Abdomen is protuberant She has crepitus of the knees. Trace edema is present at the ankles.  General appearance :adequately nourished; in no distress. As per CDC Guidelines ,Epic documents obesity as being present . Eyes: No conjunctival inflammation or scleral icterus is present. Heart:  Normal rate and regular rhythm. S1 and S2 normal without gallop, murmur, click, rub or other extra sounds   Lungs:Chest clear to auscultation; no wheezes, rhonchi,rales ,or rubs present.No increased work of breathing.  Abdomen: bowel sounds normal, soft and non-tender without masses, organomegaly or hernias noted.  No guarding or  rebound. No flank tenderness to percussion. Vascular : all pulses equal ; no bruits present. Skin:Warm & dry.  Intact without suspicious lesions or rashes ; no jaundice or tenting Lymphatic: No lymphadenopathy is noted about the head, neck, axilla Strength , tone & DTRs symmetric &  WNL in BLE. Gait normal.          Assessment & Plan:  #1 peripheral neuropathy vs neurogenic claudication from spinal stenosis  #2 history of spinal stenosis  #3 bruising attributed to Clonazepam  #4uncontrolled hypertension; risk discussed (8 X normal for CVA/ MI)  #5 PMH of subjective intolerance to multiple drugs  Plan: See orders recommendations

## 2014-08-19 ENCOUNTER — Other Ambulatory Visit: Payer: Self-pay | Admitting: Internal Medicine

## 2014-08-19 DIAGNOSIS — E1140 Type 2 diabetes mellitus with diabetic neuropathy, unspecified: Secondary | ICD-10-CM

## 2014-08-22 ENCOUNTER — Encounter: Payer: Self-pay | Admitting: Internal Medicine

## 2014-08-22 ENCOUNTER — Ambulatory Visit (INDEPENDENT_AMBULATORY_CARE_PROVIDER_SITE_OTHER): Payer: PPO | Admitting: Internal Medicine

## 2014-08-22 VITALS — BP 138/78 | HR 74 | Temp 98.2°F | Resp 16 | Ht 62.5 in | Wt 194.8 lb

## 2014-08-22 DIAGNOSIS — R059 Cough, unspecified: Secondary | ICD-10-CM

## 2014-08-22 DIAGNOSIS — I10 Essential (primary) hypertension: Secondary | ICD-10-CM

## 2014-08-22 DIAGNOSIS — R05 Cough: Secondary | ICD-10-CM

## 2014-08-22 DIAGNOSIS — J988 Other specified respiratory disorders: Principal | ICD-10-CM

## 2014-08-22 DIAGNOSIS — E1149 Type 2 diabetes mellitus with other diabetic neurological complication: Secondary | ICD-10-CM

## 2014-08-22 DIAGNOSIS — B9789 Other viral agents as the cause of diseases classified elsewhere: Secondary | ICD-10-CM

## 2014-08-22 DIAGNOSIS — J310 Chronic rhinitis: Secondary | ICD-10-CM

## 2014-08-22 DIAGNOSIS — B349 Viral infection, unspecified: Secondary | ICD-10-CM

## 2014-08-22 MED ORDER — BENZONATATE 100 MG PO CAPS: 100.0000 mg | ORAL_CAPSULE | Freq: Three times a day (TID) | ORAL | Status: DC | PRN

## 2014-08-22 MED ORDER — AZITHROMYCIN 250 MG PO TABS: ORAL_TABLET | ORAL | Status: DC

## 2014-08-22 NOTE — Progress Notes (Signed)
Pre visit review using our clinic review tool, if applicable. No additional management support is needed unless otherwise documented below in the visit note. 

## 2014-08-22 NOTE — Progress Notes (Signed)
   Subjective:    Patient ID: Cheryl Brown, female    DOB: [birth year], 79 y.o.   MRN: [id number]  HPI  On 08/21/14 she noted her voice was "raspy". Subsequently she noted chills, nausea, slight dizziness, and cough productive of clear sputum.  With the symptoms she's had fatigue and some frontal headache discomfort & discomfort in the right ear without associated discharge.   She has had the flu vaccine  Fasting glucoses range 136-168. Her A1c was 7.2%. The significance of this was discussed.  She has been compliant with the new blood pressure medicine her blood pressure has ranged 120/75-137/89. Her renal function was normal.  Review of Systems She denies persistent frontal sinus or maxillary sinus pain. She has no nasal purulence. There is no pharyngitis or otic pain.  The cough is not associated with shortness of breath or wheezing despite a history of COPD.  The numbness in the legs persist. Gabapentin may have helped but it caused her excessive sleepiness.  She's been off clonazepam but the leg symptoms have not improved.      Objective:   Physical Exam  Pertinent or positive findings include: She has an upper plate and lower partial. The nares are dry with inflammation on the right. There is no associated exudate She has amputations of the right upper extremity digits  General appearance :adequately nourished; in no distress. Eyes: No conjunctival inflammation or scleral icterus is present. Oral exam:  Lips and gums are healthy appearing.There is no oropharyngeal erythema or exudate noted.  Heart:  Normal rate and regular rhythm. S1 and S2 normal without gallop, murmur, click, rub or other extra sounds   Lungs:Chest clear to auscultation; no wheezes, rhonchi,rales ,or rubs present.No increased work of breathing. Repeat O2 sats 95%. Abdomen: bowel sounds normal, soft and non-tender without masses, organomegaly or hernias noted.  No guarding or rebound. No flank  tenderness to percussion. Vascular : all pulses equal ; no bruits present. Skin:Warm & dry.  Intact without suspicious lesions or rashes ; no jaundice or tenting Lymphatic: No lymphadenopathy is noted about the head, neck, axilla           Assessment & Plan:  #1 viral URI #2 rhinitis, non allergic #3HTN improved #4 DM, nutrition discussed #5 peripheral neuropathy vs neurogenic claudication.Repeat Clonazepam trial . Non operative back specialty evaluation if no better #6 hypoxia not verified See orders & AVS

## 2014-08-22 NOTE — Patient Instructions (Signed)
Plain Mucinex (NOT D) for thick secretions ;force NON dairy fluids .   Nasal cleansing in the shower as discussed with lather of mild shampoo.After 10 seconds wash off lather while  exhaling through nostrils. Make sure that all residual soap is removed to prevent irritation.  Flonase OR Nasacort AQ 1 spray in each nostril twice a day as needed. Use the "crossover" technique into opposite nostril spraying toward opposite ear @ 45 degree angle, not straight up into nostril.  Plain Allegra (NOT D )  160 daily , Loratidine 10 mg , OR Zyrtec 10 mg @ bedtime  as needed for itchy eyes & sneezing.  Fill the  prescription for Zithromax it there is not dramatic improvement in the next 48 hours.

## 2014-08-23 ENCOUNTER — Telehealth: Payer: Self-pay | Admitting: Internal Medicine

## 2014-08-23 NOTE — Telephone Encounter (Signed)
emmi mailed  °

## 2014-09-24 ENCOUNTER — Other Ambulatory Visit: Payer: Self-pay

## 2014-09-24 DIAGNOSIS — I10 Essential (primary) hypertension: Secondary | ICD-10-CM

## 2014-09-24 MED ORDER — AMLODIPINE BESYLATE 2.5 MG PO TABS: 2.5000 mg | ORAL_TABLET | Freq: Every day | ORAL | Status: DC

## 2014-09-24 MED ORDER — VALSARTAN 160 MG PO TABS: 160.0000 mg | ORAL_TABLET | Freq: Every day | ORAL | Status: DC

## 2014-10-15 ENCOUNTER — Telehealth: Payer: Self-pay | Admitting: Internal Medicine

## 2014-10-15 ENCOUNTER — Other Ambulatory Visit: Payer: Self-pay | Admitting: Internal Medicine

## 2014-10-15 NOTE — Telephone Encounter (Signed)
Pt called in said that she can not take the amLODipine (NORVASC) 2.5 MG tablet [[id number]]   It is giving her a bad headache when she takes it.  What does she need to do?

## 2014-10-15 NOTE — Telephone Encounter (Signed)
Patient called back. She has been advised of Dr Hopper's response.

## 2014-10-15 NOTE — Telephone Encounter (Signed)
Stop it & monitor BP Minimal Blood Pressure Goal= AVERAGE < 140/90;  Ideal is an AVERAGE < 135/85. This AVERAGE should be calculated from @ least 5-7 BP readings taken @ different times of day on different days of week. You should not respond to isolated BP readings , but rather the AVERAGE for that week .Please bring your  blood pressure cuff to office visits to verify that it is reliable.It  can also be checked against the blood pressure device at the pharmacy. Finger or wrist cuffs are not dependable; an arm cuff is.

## 2014-10-15 NOTE — Telephone Encounter (Signed)
Left message for patient to return call.

## 2014-10-30 ENCOUNTER — Ambulatory Visit (INDEPENDENT_AMBULATORY_CARE_PROVIDER_SITE_OTHER): Payer: PPO | Admitting: Internal Medicine

## 2014-10-30 ENCOUNTER — Ambulatory Visit (INDEPENDENT_AMBULATORY_CARE_PROVIDER_SITE_OTHER)
Admission: RE | Admit: 2014-10-30 | Discharge: 2014-10-30 | Disposition: A | Discharge status: Home / Self Care | Payer: PPO | Source: Ambulatory Visit | Attending: Internal Medicine | Admitting: Internal Medicine

## 2014-10-30 ENCOUNTER — Other Ambulatory Visit: Payer: Self-pay | Admitting: Internal Medicine

## 2014-10-30 ENCOUNTER — Other Ambulatory Visit (INDEPENDENT_AMBULATORY_CARE_PROVIDER_SITE_OTHER): Payer: PPO

## 2014-10-30 ENCOUNTER — Encounter: Payer: Self-pay | Admitting: Internal Medicine

## 2014-10-30 VITALS — BP 118/72 | HR 89 | Temp 99.1°F | Resp 15 | Ht 63.0 in | Wt 193.8 lb

## 2014-10-30 DIAGNOSIS — R0689 Other abnormalities of breathing: Secondary | ICD-10-CM

## 2014-10-30 DIAGNOSIS — J209 Acute bronchitis, unspecified: Secondary | ICD-10-CM

## 2014-10-30 DIAGNOSIS — J069 Acute upper respiratory infection, unspecified: Secondary | ICD-10-CM

## 2014-10-30 DIAGNOSIS — J189 Pneumonia, unspecified organism: Secondary | ICD-10-CM

## 2014-10-30 DIAGNOSIS — R0989 Other specified symptoms and signs involving the circulatory and respiratory systems: Secondary | ICD-10-CM

## 2014-10-30 LAB — CBC WITH DIFFERENTIAL/PLATELET
Basophils Absolute: 0 10*3/uL (ref 0.0–0.1)
Basophils Relative: 0.2 % (ref 0.0–3.0)
EOS PCT: 0.2 % (ref 0.0–5.0)
Eosinophils Absolute: 0 10*3/uL (ref 0.0–0.7)
HEMATOCRIT: 40.2 % (ref 36.0–46.0)
HEMOGLOBIN: 13.8 g/dL (ref 12.0–15.0)
LYMPHS ABS: 1 10*3/uL (ref 0.7–4.0)
Lymphocytes Relative: 6.5 % — ABNORMAL LOW (ref 12.0–46.0)
MCHC: 34.3 g/dL (ref 30.0–36.0)
MCV: 91.3 fl (ref 78.0–100.0)
MONO ABS: 1.1 10*3/uL — AB (ref 0.1–1.0)
Monocytes Relative: 7.4 % (ref 3.0–12.0)
NEUTROS ABS: 12.6 10*3/uL — AB (ref 1.4–7.7)
Neutrophils Relative %: 85.7 % — ABNORMAL HIGH (ref 43.0–77.0)
Platelets: 220 10*3/uL (ref 150.0–400.0)
RBC: 4.4 Mil/uL (ref 3.87–5.11)
RDW: 12.7 % (ref 11.5–15.5)
WBC: 14.6 10*3/uL — ABNORMAL HIGH (ref 4.0–10.5)

## 2014-10-30 MED ORDER — LEVOFLOXACIN 500 MG PO TABS: 500.0000 mg | ORAL_TABLET | Freq: Every day | ORAL | Status: DC

## 2014-10-30 MED ORDER — PREDNISONE 20 MG PO TABS: 20.0000 mg | ORAL_TABLET | Freq: Two times a day (BID) | ORAL | Status: DC

## 2014-10-30 NOTE — Patient Instructions (Signed)
  Your next office appointment will be determined based upon review of your pending labs &  x-rays. Those instructions will be transmitted to you by mail  Critical values will be called. Followup as needed for any active or acute issue. Please report any significant change in your symptoms. 

## 2014-10-30 NOTE — Progress Notes (Signed)
Pre visit review using our clinic review tool, if applicable. No additional management support is needed unless otherwise documented below in the visit note. 

## 2014-10-30 NOTE — Progress Notes (Signed)
   Subjective:    Patient ID: Cheryl Brown, female    DOB: 1935/11/29, 79 y.o.   MRN: [id number]  HPI Symptoms began 10/25/14 as sore throat and diffuse aching of muscles and joints. Subsequently she's developed sputum production with brown green color. She also describes nausea. She has a lesser volume of green/brown nasal discharge. The cough has been associated with shortness of breath and some wheezing. It has disturbed sleep. Delsym, Tylenol, Mucinex have been of slight benefit.  She describes fever, chills, sweats. She did not actually measure her temperature.  She has some sneezing but no other extrinsic symptoms. She has some pain in the right ear.  She did take the flu shot.  She is not monitoring glucoses. Her last A1c was 7.2% in January of this year.  Review of Systems She has had some right frontal headache intermittently.  She denies facial pain, itchy, watery eyes, or sneezing.     Objective:   Physical Exam  Pertinent or positive findings include: Very hoarse. The left tympanic membrane is dull & scarred.  She has an upper dental plate and lower partial.  Juicy rales are suggested at the RLL. Scattered ecchymoses of forearms.  General appearance:Adequately nourished; no acute distress or increased work of breathing is present.   BMI:34.33 Lymphatic: No  lymphadenopathy about the head, neck, or axilla . Eyes: No conjunctival inflammation or lid edema is present. There is no scleral icterus. Ears:  External ear exam shows no significant lesions or deformities.  Otoscopic examination reveals clear canals, tympanic membranes are intact bilaterally without bulging, retraction, inflammation or discharge. Nose:  External nasal examination shows no deformity or inflammation. Nasal mucosa are slightly erythematous without lesions or exudates No septal dislocation or deviation.No obstruction to airflow.  Oral exam:  lips and gums are healthy appearing.There is no  oropharyngeal erythema or exudate . Neck:  No deformities, thyromegaly, masses, or tenderness noted.   Supple with full range of motion without pain.  Heart:  Normal rate and regular rhythm. S1 and S2 normal without gallop, murmur, click, rub or other extra sounds.  Extremities:  No cyanosis, edema, or clubbing  noted .Amputations unchanged. Skin: Warm & dry w/o tenting or jaundice. No significant lesions or rash.      Assessment & Plan:  #1 acute bronchitis with rales RLL; R/O CAP #2 URI, acute Plan: See orders and recommendations

## 2014-11-06 ENCOUNTER — Encounter: Payer: Self-pay | Admitting: Internal Medicine

## 2014-11-06 ENCOUNTER — Ambulatory Visit (INDEPENDENT_AMBULATORY_CARE_PROVIDER_SITE_OTHER): Payer: PPO | Admitting: Internal Medicine

## 2014-11-06 ENCOUNTER — Ambulatory Visit (INDEPENDENT_AMBULATORY_CARE_PROVIDER_SITE_OTHER)
Admission: RE | Admit: 2014-11-06 | Discharge: 2014-11-06 | Disposition: A | Discharge status: Home / Self Care | Payer: PPO | Source: Ambulatory Visit | Attending: Internal Medicine | Admitting: Internal Medicine

## 2014-11-06 VITALS — BP 122/90 | HR 66 | Temp 97.6°F | Ht 63.0 in | Wt 190.5 lb

## 2014-11-06 DIAGNOSIS — J189 Pneumonia, unspecified organism: Secondary | ICD-10-CM

## 2014-11-06 DIAGNOSIS — J3489 Other specified disorders of nose and nasal sinuses: Secondary | ICD-10-CM

## 2014-11-06 MED ORDER — MUPIROCIN 2 % EX OINT: TOPICAL_OINTMENT | CUTANEOUS | Status: DC

## 2014-11-06 NOTE — Progress Notes (Signed)
   Subjective:    Patient ID: Cheryl Brown, female    DOB: 24-Dec-1935, 79 y.o.   MRN: [id number]  HPI    She has completed Levaquin and prednisone ; she is significantly better subjectively. She still has some cough and pressure  in the right ear. She has some greenish/brown sputum 1-2 times per day but this has decreased in amount significantly. The cough is no longer an issue at night. She has some sweats and chills at night ; she did not have this last night.   She has not taken her blood pressure pills this morning; she is to refill her prescription today . Her reflux is controlled with Prilosec.    Review of Systems  She denies fever , frontal headache, facial pain, nasal purulence, sore throat, otic discharge.  The cough is not associated with wheezing or shortness of breath.     Objective:   Physical Exam  Pertinent or positive findings include :   There is significant erythema and drying of the right nasal septum.  She has a tiny perforation the right tympanic membrane without signs of active inflammation.  She has an upper plate and lower partial.  An S4 is present.  She has minor rales at the bases, greater on the right than the left.  She has amputations of the right hand.  General appearance:Adequately nourished; no acute distress or increased work of breathing is present.   Lymphatic: No  lymphadenopathy about the head, neck, or axilla . Eyes: No conjunctival inflammation or lid edema is present. There is no scleral icterus. Ears:  External ear exam shows no significant lesions or deformities.  Otoscopic examination reveals clear canals, tympanic membranes are without bulging, retraction, inflammation or discharge. Nose:  External nasal examination shows no deformity or inflammation.No septal dislocation or deviation.No obstruction to airflow.  Oral exam:  lips and gums are healthy appearing.There is no oropharyngeal erythema or exudate . Neck:  No deformities,  thyromegaly, masses, or tenderness noted.   Supple with full range of motion without pain. Heart:  Normal rate and regular rhythm. S1 and S2 normal without gallop, murmur, click, or rub.  Lungs:no wheezes, rhonchi,or rubs present.No increased work of breathing Extremities:  No cyanosis, edema, or clubbing  noted  Skin: Warm & dry w/o tenting or jaundice. No significant lesions or rash.     Assessment & Plan:   #1 community-acquired pneumonia right upper lobe; serial x-rays were reviewed. I cannot appreciate a right upper lobe infiltrate. She does have increased interstitial workings particularly @ the bases. She has reticulonodular changes as well.  Hilar asymmetry suggested on the initial film last week has resolved ; but  there is a change in rotation.   See orders

## 2014-11-06 NOTE — Progress Notes (Signed)
Pre visit review using our clinic review tool, if applicable. No additional management support is needed unless otherwise documented below in the visit note. 

## 2014-11-06 NOTE — Patient Instructions (Signed)
Plain Mucinex (NOT D) for thick secretions ;force NON dairy fluids .   Nasal cleansing in the shower as discussed with lather of mild shampoo.After 10 seconds wash off lather while  exhaling through nostrils. Make sure that all residual soap is removed to prevent irritation.  Flonase OR Nasacort AQ 1 spray in each nostril twice a day as needed. Use the "crossover" technique into opposite nostril spraying toward opposite ear @ 45 degree angle, not straight up into nostril.  Plain Allegra (NOT D )  160 daily , Loratidine 10 mg , OR Zyrtec 10 mg @ bedtime  as needed for itchy eyes & sneezing.  Complementary options to boost immunity include  vitamin C 2000 mg daily; & Echinacea for 4-7 days.  Report fever; discolored nasal or chest secretions; or frontal headache or facial pain  present

## 2014-11-13 ENCOUNTER — Other Ambulatory Visit: Payer: Self-pay

## 2014-11-13 MED ORDER — CLONAZEPAM 0.5 MG PO TABS: ORAL_TABLET | ORAL | Status: DC

## 2014-11-13 NOTE — Telephone Encounter (Signed)
Clonazepam has been called to [facility] [phone]

## 2014-11-13 NOTE — Telephone Encounter (Signed)
OK x 1 

## 2014-12-19 ENCOUNTER — Telehealth: Payer: Self-pay

## 2014-12-19 NOTE — Telephone Encounter (Signed)
Spoke to pt and pt stated that now id not a good time.

## 2014-12-26 ENCOUNTER — Other Ambulatory Visit (INDEPENDENT_AMBULATORY_CARE_PROVIDER_SITE_OTHER): Payer: PPO

## 2014-12-26 DIAGNOSIS — E1140 Type 2 diabetes mellitus with diabetic neuropathy, unspecified: Secondary | ICD-10-CM | POA: Diagnosis not present

## 2014-12-26 LAB — BASIC METABOLIC PANEL
BUN: 14 mg/dL (ref 6–23)
CO2: 29 mEq/L (ref 19–32)
Calcium: 9.9 mg/dL (ref 8.4–10.5)
Chloride: 100 mEq/L (ref 96–112)
Creatinine, Ser: 0.69 mg/dL (ref 0.40–1.20)
GFR: 87.78 mL/min (ref >60.00)
Glucose, Bld: 398 mg/dL — ABNORMAL HIGH (ref 70–99)
Potassium: 5 mEq/L (ref 3.5–5.1)
Sodium: 133 mEq/L — ABNORMAL LOW (ref 135–145)

## 2014-12-26 LAB — MICROALBUMIN / CREATININE URINE RATIO
CREATININE, U: 57.3 mg/dL
Microalb Creat Ratio: 1.2 mg/g (ref 0.0–30.0)

## 2014-12-26 LAB — HEMOGLOBIN A1C: HEMOGLOBIN A1C: 9.5 % — AB (ref 4.6–6.5)

## 2015-01-02 ENCOUNTER — Encounter: Payer: Self-pay | Admitting: Internal Medicine

## 2015-01-02 ENCOUNTER — Ambulatory Visit (INDEPENDENT_AMBULATORY_CARE_PROVIDER_SITE_OTHER): Payer: PPO | Admitting: Internal Medicine

## 2015-01-02 VITALS — BP 132/86 | HR 59 | Temp 97.6°F | Wt 187.0 lb

## 2015-01-02 DIAGNOSIS — Z8489 Family history of other specified conditions: Secondary | ICD-10-CM | POA: Diagnosis not present

## 2015-01-02 DIAGNOSIS — E1165 Type 2 diabetes mellitus with hyperglycemia: Secondary | ICD-10-CM

## 2015-01-02 DIAGNOSIS — IMO0002 Reserved for concepts with insufficient information to code with codable children: Secondary | ICD-10-CM

## 2015-01-02 DIAGNOSIS — E1140 Type 2 diabetes mellitus with diabetic neuropathy, unspecified: Secondary | ICD-10-CM | POA: Diagnosis not present

## 2015-01-02 DIAGNOSIS — G2581 Restless legs syndrome: Secondary | ICD-10-CM | POA: Diagnosis not present

## 2015-01-02 DIAGNOSIS — Z87898 Personal history of other specified conditions: Secondary | ICD-10-CM

## 2015-01-02 MED ORDER — ROPINIROLE HCL 0.25 MG PO TABS: 0.2500 mg | ORAL_TABLET | Freq: Every day | ORAL | Status: DC

## 2015-01-02 NOTE — Progress Notes (Signed)
   Subjective:    Patient ID: Cheryl Brown, female    DOB: 22-May-1936, 79 y.o.   MRN: [id number]  HPI FBS range is 234-315 2 hour post meal glucose is as high as 383. Hypoglycemia not reported Ophthalmologic exam is current ;no retinopathy present. Foot care not current. She does have numbness and tingling in feet. Diet is not heart healthy , low carb Exercise is walking 30 minutes a day. She previously walked 1 hour/day. She decreased this 3 months ago due to weakness in her legs. Her most recent A1c was 9.5% which would correlate with an average sugar of 261 and 90% increased risk of premature heart attack or stroke. Risks were discussed with her  She had [facility] screening. This revealed mild L carotid artery disease. LDL cholesterol is 115, TG 142, HDL 48. Random glucose was 163. C-reactive protein was 1.48. Waist measurement was measureded at 40 inches.   Review of Systems   Polyuria, polyphagia, polydipsia absent.  There is no blurred vision, double vision, or loss of vision.   No postural dizziness noted. No nonhealing skin lesions present.  Weight is stable.   She's been losing sleep due to pain, itching, burning in her legs from knees to her feet. The symptoms occur when the patient is sitting still or supine in bed. The symptoms do improve with ambulation. She's been diagnosed with restless leg syndrome and neuropathy. Lyrica trial 2-3 years ago was associated with swelling of the legs and feet. She discontinued clonazepam 1 month ago in lieu of gabapentin trial. This also caused itching symptoms & she discontinued it with resolution of the symptoms.  She previously was referred for sleep apnea but this was not cancelled. She does wish to pursue this.    Objective:   Physical Exam Pertinent or positive findings include:  She has an upper plate and lower partial. Abdomen is protuberant.  She has multiple amputations of digits of the right hand. She has a bunion of the  great toes, right greater than left.  General appearance :adequately nourished; in no distress. Eyes: No conjunctival inflammation or scleral icterus is present. Oral exam:  Lips and gums are healthy appearing.There is no oropharyngeal erythema or exudate noted. Heart:  Normal rate and regular rhythm. S1 and S2 normal without gallop, murmur, click, rub or other extra sounds   Lungs:Chest clear to auscultation; no wheezes, rhonchi,rales ,or rubs present.No increased work of breathing.  Abdomen: bowel sounds normal, soft and non-tender without masses, organomegaly or hernias noted.  No guarding or rebound.  Vascular : all pulses equal ; no bruits present. Skin:Warm & dry.  Intact without suspicious lesions or rashes ; no tenting or jaundice  Lymphatic: No lymphadenopathy is noted about the head, neck, axilla Neuro: Strength, tone & DTRs normal.        Assessment & Plan:  #1 uncontrolled diabetes with neuropathy. Endocrinology referral. Nutritional interventions discussed. Risk of premature heart attack or stroke discussed.  #2 restless leg syndrome. Trial of Requip with titration will be initiated.  #3 history of snoring. Sleep apnea evaluation will be pursued.

## 2015-01-02 NOTE — Progress Notes (Signed)
Pre visit review using our clinic review tool, if applicable. No additional management support is needed unless otherwise documented below in the visit note. 

## 2015-01-02 NOTE — Patient Instructions (Signed)
Requip should be taken 1-3 hours before bedtime. Initially take 0.25 mg on days 1 and 2, then 2 pills on days 3-7. It can be increased by 2 pills every 7 days up to 4 pills. It can be changed to a 2 mg pill if the 4 pills of 0.25 are only partially effective.   The Endocrinology & Sleep Medicine referral will be scheduled and you'll be notified of the time.Please call the Referral Co-Ordinator @ [phone] if you have not been notified of appointment time within 7-10 days.   The following nutritional changes may help prevent Diabetes progression & complications.  White carbohydrates (potatoes, rice, bread, and pasta) cause a high spike of the sugar level which stays elevated for a significant period of time (called sugar"load").  For example a  baked potato has a cup of sugar and a  french fry  2 teaspoons of sugar.  More complex carbs such as yams, wild  rice, whole grained bread &  wheat pasta have been much lower spike and persistent load of sugar than the white carbs. The pancreas excretes excess insulin in response to the high spike & load of sugar . Over time the pancreas can actually run out of insulin necessitating insulin shots.

## 2015-01-15 ENCOUNTER — Ambulatory Visit (INDEPENDENT_AMBULATORY_CARE_PROVIDER_SITE_OTHER): Payer: PPO | Admitting: Internal Medicine

## 2015-01-15 ENCOUNTER — Encounter: Payer: Self-pay | Admitting: Internal Medicine

## 2015-01-15 VITALS — BP 112/66 | HR 68 | Ht 62.0 in | Wt 184.0 lb

## 2015-01-15 DIAGNOSIS — G4700 Insomnia, unspecified: Secondary | ICD-10-CM

## 2015-01-15 DIAGNOSIS — G4733 Obstructive sleep apnea (adult) (pediatric): Secondary | ICD-10-CM

## 2015-01-15 DIAGNOSIS — J449 Chronic obstructive pulmonary disease, unspecified: Secondary | ICD-10-CM

## 2015-01-15 DIAGNOSIS — G4730 Sleep apnea, unspecified: Secondary | ICD-10-CM

## 2015-01-15 DIAGNOSIS — G2581 Restless legs syndrome: Secondary | ICD-10-CM | POA: Diagnosis not present

## 2015-01-15 NOTE — Progress Notes (Signed)
12/01/12- 15 yoF  Pt. referred by Dr. Unice Cobble for problems falling and staying  asleep.  Pt. states she gets about  7 hours of sleep 2 to 3 times a week.  Hx of snoring. During colonoscopy she was noted to stop breathing Usually she falls asleep reasonably well, using occ clonazepam 0.5 mg,  but wakes frequently for no good reason. She is comfortable lying in bed. Sometimes back ache or feeling smothered lying in bed. She has been widowed 10 years with no witness of her sleep quality. Bedtime midnight, sleep latency 10-15 minutes, waking 3 or 4 times before up between 7 and 8 AM. Drowsy with TV but okay driving. Deviated septum repair. Diagnosed with hypertension, asthma with COPD, diabetes and allergy. She has a benign pituitary tumor which is just being watched. History of thyroid nodule but not hyperthyroidism. 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 referred by Dr. Unice Cobble in 2014 for problems falling and staying  asleep. Complicated by diabetes. She was to schedule a sleep study at that time but did not follow through. Now her snoring is keeping her husband awake and she fights daytime tiredness. She is willing to go ahead with a sleep study. Reports:SOB during the night, prod. cough at  times clear mucus,. Still having problems getting to sleep. Wakes gasping for air and has to sit up. We had given clonazepam to help with insomnia and restless legs but she didn't like the way it made her feel. She has also tried Requip but says both that and Klonopin caused nausea and loose stools.. She reports diagnosis of COPD. Quit smoking many years ago. Using Advair  Chronic palpitations. ENT surgery-tonsillectomy, septoplasty Office spirometry 01/15/2015-within normal limits. FVC 2.04/81%, FEV1 1.62/87%, FEV1/FVC 0.80, FEF 25-75 percent 1.80/111%.  ROS-see HPI Constitutional:   No-   weight loss, night sweats, fevers, chills,+  fatigue, lassitude. HEENT:   +headaches, difficulty swallowing, tooth/dental problems, sore throat,       +  sneezing, itching, ear ache, nasal congestion, post nasal drip,  CV:  No-   chest pain, orthopnea, PND, swelling in lower extremities, anasarca,                                  dizziness, palpitations Resp: +  shortness of breath with exertion or at rest.              + productive cough, + non-productive cough,  No- coughing up of blood.              No-   change in color of mucus.  No- wheezing.   Skin: No-   rash or lesions. GI:  + heartburn, indigestion, +abdominal pain, nausea, vomiting, diarrhea,                 change in bowel habits, +loss of appetite GU: No-   dysuria, change in color of urine, no urgency or frequency.  No- flank pain. MS:  No-   joint pain or swelling.  No- decreased range of motion.  No- back pain. Neuro-     nothing unusual Psych:  No- change in mood or affect. No depression or anxiety.  No memory loss.  OBJ- Physical Exam General- Alert, Oriented, Affect-appropriate, Distress- none acute Skin- rash-none, lesions- none, excoriation- none Lymphadenopathy- none Head- atraumatic  Eyes- Gross vision intact, PERRLA, conjunctivae and secretions clear            Ears- Hearing, canals-normal            Nose- Clear, no-Septal dev, mucus, polyps, erosion, perforation             Throat- Mallampati III , mucosa clear , drainage- none, tonsils- atrophic.                        +Dentures Neck- flexible , trachea midline, no stridor , thyroid nl, carotid no bruit Chest - symmetrical excursion , unlabored           Heart/CV- RRR , no murmur , no gallop  , no rub, nl s1 s2                           - JVD- none , edema- none, stasis changes- none, varices- none           Lung- +distant but quiet, wheeze- none, cough- none , dullness-none, rub- none           Chest wall-  Abd- tender-no, distended-no, bowel sounds-present, HSM- no Br/ Gen/ Rectal- Not done,  not indicated Extrem- cyanosis- none, clubbing, none, atrophy- none, strength- nl. +Traumatic shortening fingers R hand Neuro- grossly intact to observation

## 2015-01-15 NOTE — Patient Instructions (Signed)
Order- schedule split protocol NPSG   Dx OSA  Ok to stop clonazepam and requip if they are making you feel bad  Order- office spirometry    Dx COPD chronic bronchitis

## 2015-01-15 NOTE — Assessment & Plan Note (Signed)
She reports that both clonazepam and Requip have caused nausea. She is diabetic so consider the possibility this is diabetic enteropathy rather than result of those drugs. Plan-stay off of clonazepam and Requip for now. We will note limb movement recordings on her upcoming sleep study.

## 2015-01-15 NOTE — Assessment & Plan Note (Signed)
Presumptive diagnosis. Nonrestorative sleep, loud snoring, wakes gasping, witnessed apneas, daytime sleepiness. Plan-split protocol polysomnogram was discussed with her and scheduled. We will see her back after that is completed.

## 2015-01-15 NOTE — Assessment & Plan Note (Signed)
Remote smoking history. She may have episodic bronchitis. Simple spirometry does not confirm obstructive airways disease at this time.

## 2015-01-16 ENCOUNTER — Ambulatory Visit (INDEPENDENT_AMBULATORY_CARE_PROVIDER_SITE_OTHER): Payer: PPO | Admitting: Internal Medicine

## 2015-01-16 ENCOUNTER — Encounter: Payer: Self-pay | Admitting: Internal Medicine

## 2015-01-16 VITALS — BP 132/88 | HR 83 | Temp 97.7°F | Resp 16 | Wt 185.0 lb

## 2015-01-16 DIAGNOSIS — T50905A Adverse effect of unspecified drugs, medicaments and biological substances, initial encounter: Secondary | ICD-10-CM

## 2015-01-16 DIAGNOSIS — E1165 Type 2 diabetes mellitus with hyperglycemia: Principal | ICD-10-CM

## 2015-01-16 DIAGNOSIS — E1141 Type 2 diabetes mellitus with diabetic mononeuropathy: Secondary | ICD-10-CM | POA: Diagnosis not present

## 2015-01-16 DIAGNOSIS — IMO0002 Reserved for concepts with insufficient information to code with codable children: Secondary | ICD-10-CM

## 2015-01-16 DIAGNOSIS — E1149 Type 2 diabetes mellitus with other diabetic neurological complication: Secondary | ICD-10-CM

## 2015-01-16 DIAGNOSIS — T887XXA Unspecified adverse effect of drug or medicament, initial encounter: Secondary | ICD-10-CM

## 2015-01-16 MED ORDER — REPAGLINIDE 1 MG PO TABS: 1.0000 mg | ORAL_TABLET | Freq: Three times a day (TID) | ORAL | Status: DC

## 2015-01-16 NOTE — Patient Instructions (Addendum)
   The metformin should be taken on full stomach after breakfast and after the evening meal.  If the nausea does not recur after 5 days on this regimen; Requip can be restarted at 0.25 mg one hour before bedtime.

## 2015-01-16 NOTE — Progress Notes (Signed)
Pre visit review using our clinic review tool, if applicable. No additional management support is needed unless otherwise documented below in the visit note. 

## 2015-01-16 NOTE — Progress Notes (Signed)
   Subjective:    Patient ID: Cheryl Brown, female    DOB: Apr 01, 1936, 79 y.o.   MRN: [id number]  HPI A PA who goes to her church prescribed metformin 500 mg twice a day. She's been taking it before breakfast and before the lunch. She has been taking Requip approximately 3 hours before bedtime. She was having nausea & vomiting with his regimen and stopped the Requip last night. Nausea has improved.  The Requip was dramatically effective for the burning pain noted her lower extremities  Blood sugars were 220-413 prior to the metformin. On the metformin the glucoses are averaging in the 300s. Her Endocrinology appointment is scheduled in late July 2016.  She has no other GI symptoms.  Review of Systems Unexplained weight loss, abdominal pain, significant dyspepsia, dysphagia, melena, rectal bleeding, or persistently small caliber stools are denied.    Objective:   Physical Exam  Positive or pertinent findings include: She has an upper plate and lower partial. Abdomen is protuberant. She has multiple amputations of fingers of the right hand.  General appearance :adequately nourished; in no distress.  Eyes: No conjunctival inflammation or scleral icterus is present.  Oral exam:  Lips and gums are healthy appearing.There is no oropharyngeal erythema or exudate noted.   Heart:  Normal rate and regular rhythm. S1 and S2 normal without gallop, murmur, click, rub or other extra sounds    Lungs:Chest clear to auscultation; no wheezes, rhonchi,rales ,or rubs present.No increased work of breathing.   Abdomen: bowel sounds normal, soft and non-tender without masses, organomegaly or hernias noted.  No guarding or rebound.  Vascular : all pulses equal ; no bruits present.  Skin:Warm & dry.  Intact without suspicious lesions or rashes ; no tenting or jaundice   Lymphatic: No lymphadenopathy is noted about the head, neck, axilla  Neuro: Strength, tone  normal.        Assessment &  Plan:  #1 nausea and vomiting possibly related to the Requip.  #2 uncontrolled diabetes. Metformin regimen alone is suboptimal and also should be postprandial. Amaryl not an option due to sulfa intolerance.  Plan: See orders and recommendations

## 2015-03-04 ENCOUNTER — Ambulatory Visit: Payer: PPO | Admitting: Internal Medicine

## 2015-03-08 ENCOUNTER — Ambulatory Visit (HOSPITAL_BASED_OUTPATIENT_CLINIC_OR_DEPARTMENT_OTHER): Payer: PPO | Attending: Internal Medicine

## 2015-03-08 VITALS — Ht 62.0 in | Wt 180.0 lb

## 2015-03-08 DIAGNOSIS — G4736 Sleep related hypoventilation in conditions classified elsewhere: Secondary | ICD-10-CM | POA: Diagnosis not present

## 2015-03-08 DIAGNOSIS — G4733 Obstructive sleep apnea (adult) (pediatric): Secondary | ICD-10-CM | POA: Diagnosis not present

## 2015-03-08 DIAGNOSIS — I493 Ventricular premature depolarization: Secondary | ICD-10-CM | POA: Insufficient documentation

## 2015-03-08 DIAGNOSIS — R0683 Snoring: Secondary | ICD-10-CM | POA: Insufficient documentation

## 2015-03-08 DIAGNOSIS — R451 Restlessness and agitation: Secondary | ICD-10-CM | POA: Insufficient documentation

## 2015-03-08 DIAGNOSIS — G4700 Insomnia, unspecified: Secondary | ICD-10-CM | POA: Diagnosis present

## 2015-03-14 ENCOUNTER — Telehealth: Payer: Self-pay | Admitting: *Deleted

## 2015-03-14 MED ORDER — GLUCOSE BLOOD VI STRP: 1.0000 | ORAL_STRIP | Freq: Every day | Status: AC

## 2015-03-14 NOTE — Telephone Encounter (Signed)
Received call pt is needing refills for BS strips (One Touch). Verified pharmacy inform [facility] to [facility].../lmb

## 2015-03-16 DIAGNOSIS — G4733 Obstructive sleep apnea (adult) (pediatric): Secondary | ICD-10-CM | POA: Diagnosis not present

## 2015-03-16 NOTE — Progress Notes (Signed)
    Patient Name: Cheryl Brown, Cheryl Brown Date: 03/08/2015 Gender: Female D.O.B: May 15, 1936 Age (years): 75 Referring Provider: Baird Lyons MD, ABSM Height (inches): 62 Interpreting Physician: Baird Lyons MD, ABSM Weight (lbs): 180 RPSGT: Jonna Coup BMI: 33 MRN: [id number] Neck Size: 15.00 CLINICAL INFORMATION Sleep Study Type: NPSG  Indication for sleep study: Insomnia with sleep apnea  Epworth Sleepiness Score: 6/24  SLEEP STUDY TECHNIQUE As per the AASM Manual for the Scoring of Sleep and Associated Events v2.3 (April 2016) with a hypopnea requiring 4% desaturations.  The channels recorded and monitored were frontal, central and occipital EEG, electrooculogram (EOG), submentalis EMG (chin), nasal and oral airflow, thoracic and abdominal wall motion, anterior tibialis EMG, snore microphone, electrocardiogram, and pulse oximetry.  MEDICATIONS  Patient's medications include: Charted for review. Medications self-administered by patient during sleep study : No sleep medicine administered.  SLEEP ARCHITECTURE The study was initiated at 10:27:27 PM and ended at 4:59:52 AM.  Sleep onset time was 21.2 minutes and the sleep efficiency was 39.0%. The total sleep time was 153.2 minutes.  Stage REM latency was 90.5 minutes.  The patient spent 9.46% of the night in stage N1 sleep, 66.71% in stage N2 sleep, 21.86% in stage N3 and 1.96% in REM.  Alpha intrusion was absent.  Supine sleep was 10.44%.  Awake after sleep onset 218 minutes  RESPIRATORY PARAMETERS The overall apnea/hypopnea index (AHI) was 15.3 per hour. There were 11 total apneas, including 11 obstructive, 0 central and 0 mixed apneas. There were 28 hypopneas and 4 RERAs.  Insufficient sleep time and events to qualify for split protocol CPAP titration.  The AHI during Stage REM sleep was 0.0 per hour.  AHI while supine was 26.3 per hour.  The mean oxygen saturation was 86.85%. The minimum SpO2 during  sleep was 79.00%.  Soft snoring was noted during this study.    CARDIAC DATA The 2 lead EKG demonstrated sinus rhythm. The mean heart rate was 62.64 beats per minute. Other EKG findings include: PVCs.  LEG MOVEMENT DATA The total PLMS were 8 with a resulting PLMS index of 3.13. Associated arousal with leg movement index was 0.0 .  IMPRESSIONS Mild to moderate obstructive sleep apnea occurred during this study (AHI = 15.3/h). Difficulty initiating and maintaining sleep. Did not meet protocol requirements for split protocol CPAP titration No significant central sleep apnea occurred during this study (CAI = 0.0/h). Moderate oxygen desaturation was noted during this study (Min O2 = 79.00%). The patient snored with Soft snoring volume. EKG findings include PVCs. Clinically significant periodic limb movements did not occur during sleep. No significant associated arousals. Restless sleep with difficulty initiating and maintaining sleep   DIAGNOSIS Obstructive Sleep Apnea (327.23 [G47.33 ICD-10]) Nocturnal Hypoxemia (327.26 [G47.36 ICD-10])   RECOMMENDATIONS Therapeutic CPAP titration to determine optimal pressure required to alleviate sleep disordered breathing. Positional therapy avoiding supine position during sleep. Avoid alcohol, sedatives and other CNS depressants that may worsen sleep apnea and disrupt normal sleep architecture. Sleep hygiene should be reviewed to assess factors that may improve sleep quality. Weight management and regular exercise should be initiated or continued if appropriate. Deneise Lever Diplomate, American Board of Sleep Medicine  ELECTRONICALLY SIGNED ON:  03/16/2015, 4:00 PM [place] PH: [phone]   FX: [phone] [place]

## 2015-03-18 ENCOUNTER — Telehealth: Payer: Self-pay | Admitting: Internal Medicine

## 2015-03-18 NOTE — Telephone Encounter (Signed)
Spoke with patient; she is aware that [facility] has patients come in for results; per last OV she is to follow up in Sept; pt was placed on Monday 03-25-15 at 1:45pm slot so she does not have to wait any longer; Sept appt has been cancelled.

## 2015-03-25 ENCOUNTER — Ambulatory Visit (INDEPENDENT_AMBULATORY_CARE_PROVIDER_SITE_OTHER): Payer: PPO | Admitting: Internal Medicine

## 2015-03-25 ENCOUNTER — Encounter: Payer: Self-pay | Admitting: Internal Medicine

## 2015-03-25 VITALS — BP 130/66 | HR 78 | Ht 62.0 in | Wt 185.2 lb

## 2015-03-25 DIAGNOSIS — G2581 Restless legs syndrome: Secondary | ICD-10-CM | POA: Diagnosis not present

## 2015-03-25 DIAGNOSIS — J449 Chronic obstructive pulmonary disease, unspecified: Secondary | ICD-10-CM

## 2015-03-25 DIAGNOSIS — G4733 Obstructive sleep apnea (adult) (pediatric): Secondary | ICD-10-CM | POA: Diagnosis not present

## 2015-03-25 MED ORDER — ALPRAZOLAM 0.5 MG PO TABS: ORAL_TABLET | ORAL | Status: DC

## 2015-03-25 NOTE — Progress Notes (Signed)
12/01/12- 58 yoF  Pt. referred by Dr. Unice Cobble for problems falling and staying  asleep.  Pt. states she gets about  7 hours of sleep 2 to 3 times a week.  Hx of snoring. During colonoscopy she was noted to stop breathing Usually she falls asleep reasonably well, using occ clonazepam 0.5 mg,  but wakes frequently for no good reason. She is comfortable lying in bed. Sometimes back ache or feeling smothered lying in bed. She has been widowed 10 years with no witness of her sleep quality. Bedtime midnight, sleep latency 10-15 minutes, waking 3 or 4 times before up between 7 and 8 AM. Drowsy with TV but okay driving. Deviated septum repair. Diagnosed with hypertension, asthma with COPD, diabetes and allergy. She has a benign pituitary tumor which is just being watched. History of thyroid nodule but not hyperthyroidism. Had lost ends of right fingers to trauma in a cigarette making machine at work  01/15/15- 43 yoF  Pt. referred by Dr. Unice Cobble in 2014 for problems falling and staying  asleep. Complicated by diabetes. She was to schedule a sleep study at that time but did not follow through. Now her snoring is keeping her husband awake and she fights daytime tiredness. She is willing to go ahead with a sleep study. Reports:SOB during the night, prod. cough at  times clear mucus,. Still having problems getting to sleep. Wakes gasping for air and has to sit up. We had given clonazepam to help with insomnia and restless legs but she didn't like the way it made her feel. She has also tried Requip but says both that and Klonopin caused nausea and loose stools.. She reports diagnosis of COPD. Quit smoking many years ago. Using Advair  Chronic palpitations. ENT surgery-tonsillectomy, septoplasty Office spirometry 01/15/2015-within normal limits. FVC 2.04/81%, FEV1 1.62/87%, FEV1/FVC 0.80, FEF 25-75 percent 1.80/111%.  03/25/15- 15 yo F former smoker followed for insomnia, OSA, restless legs complicated by  DM/peripheral neuropathy, COPD FOLLOWS FOR: Review sleep study with patient. Pt states she did not sleep very long. Husband here NPSG 03/08/2015-moderate obstructive sleep apnea/hypopnea syndrome, AHI 15.3 per hour with oxygen desaturation to a nadir of 79%, body weight 180 pounds FOLLOWS FOR: Review sleep study with patient. Pt states she did not sleep very long. Still significant problem initiating and maintaining sleep/insomnia. Burning paresthesias in legs with some restless leg discomfort. Known diabetic neuropathy. Requip blamed for making her legs swell and itch. Clonazepam stops her legs from hurting but leaves her hung over the next day. Intolerant of codeine. Has tried gabapentin.  ROS-see HPI Constitutional:   No-   weight loss, night sweats, fevers, chills,+ fatigue, lassitude. HEENT:   +headaches, difficulty swallowing, tooth/dental problems, sore throat,       +  sneezing, itching, ear ache, nasal congestion, post nasal drip,  CV:  No-   chest pain, orthopnea, PND, swelling in lower extremities, anasarca,                                                   dizziness, palpitations Resp: +  shortness of breath with exertion or at rest.              + productive cough, + non-productive cough,  No- coughing up of blood.  No-   change in color of mucus.  No- wheezing.   Skin: No-   rash or lesions. GI:  + heartburn, indigestion, +abdominal pain, nausea, vomiting, GU:  MS:  No-   joint pain or swelling.   Neuro-     nothing unusual Psych:  No- change in mood or affect. No depression or anxiety.  No memory loss.  OBJ- Physical Exam General- Alert, Oriented, Affect-appropriate, Distress- none acute Skin- rash-none, lesions- none, excoriation- none Lymphadenopathy- none Head- atraumatic            Eyes- Gross vision intact, PERRLA, conjunctivae and secretions clear            Ears- Hearing, canals-normal            Nose- Clear, no-Septal dev, mucus, polyps, erosion,  perforation             Throat- Mallampati III , mucosa clear , drainage- none, tonsils- atrophic.   +Dentures Neck- flexible , trachea midline, no stridor , thyroid nl, carotid no bruit Chest - symmetrical excursion , unlabored           Heart/CV- RRR , no murmur , no gallop  , no rub, nl s1 s2                           - JVD- none , edema- none, stasis changes- none, varices- none           Lung- +distant but quiet, wheeze- none, cough- none , dullness-none, rub- none           Chest wall-  Abd- Br/ Gen/ Rectal- Not done, not indicated Extrem- cyanosis- none, clubbing, none, atrophy- none, strength- nl. +Traumatic shortening fingers R hand Neuro- grossly intact to observation

## 2015-03-25 NOTE — Patient Instructions (Signed)
Order- new DME new CPAP, mask of choice( favor nasal pillows), humidifier, supplies   Dx OSA  Script for alprazolam to try instead of clonazepam.  Try 1 about an hour before bedtime, then a second at bedtime.  Consider trying Yoga, may be at the "[facility]", to help relax.

## 2015-03-27 DIAGNOSIS — G4733 Obstructive sleep apnea (adult) (pediatric): Secondary | ICD-10-CM | POA: Insufficient documentation

## 2015-03-27 NOTE — Assessment & Plan Note (Signed)
Control now is good without recent significant cough or wheeze

## 2015-03-27 NOTE — Assessment & Plan Note (Signed)
There is some overlap between restless legs and diabetic peripheral neuropathy in her lower legs. We discussed medication intolerances. It may be possible to give her a benzodiazepine with shorter half-life, seeking both common and some relief of muscle spasm Plan-trial of alprazolam with discussion

## 2015-03-27 NOTE — Assessment & Plan Note (Signed)
Appropriate education done. Treatment options reviewed. She is not sure she can tolerate a CPAP mask, especially with her insomnia. Plan-alprazolam to help sleep and hopefully help with her restless leg and diabetic neuropathy discomforts. Begin CPAP auto 5-15

## 2015-04-04 ENCOUNTER — Telehealth: Payer: Self-pay | Admitting: Internal Medicine

## 2015-04-04 DIAGNOSIS — G4733 Obstructive sleep apnea (adult) (pediatric): Secondary | ICD-10-CM

## 2015-04-04 NOTE — Telephone Encounter (Signed)
Order- DME [facility]- change CPAP pressure to fixed 12; reassess mask fit for better seal   Dx OSA

## 2015-04-04 NOTE — Telephone Encounter (Signed)
Called spoke with pt. She reports she received her CPAP on Monday from [facility]. She uses nasal pillows with a chin strap. She feels like she is suffocating but she also has a lot of air seeping through her nasal pillows. She is on auto 5-15cm. Pt is wanting her pressure changed. Please advise Dr. Annamaria Boots thanks  --download printed for Dr. Annamaria Boots as well (only has 1/3 day usage).

## 2015-04-04 NOTE — Telephone Encounter (Signed)
Called and spoke to pt. Informed pt of the recs per [facility]. Order placed. Pt verbalized understanding and denied any further questions or concerns at this time.

## 2015-04-16 ENCOUNTER — Encounter: Payer: Self-pay | Admitting: Internal Medicine

## 2015-04-16 ENCOUNTER — Ambulatory Visit (INDEPENDENT_AMBULATORY_CARE_PROVIDER_SITE_OTHER): Payer: PPO | Admitting: Internal Medicine

## 2015-04-16 VITALS — BP 118/68 | HR 61 | Temp 97.8°F | Resp 12 | Ht 63.0 in | Wt 180.4 lb

## 2015-04-16 DIAGNOSIS — E1141 Type 2 diabetes mellitus with diabetic mononeuropathy: Secondary | ICD-10-CM | POA: Diagnosis not present

## 2015-04-16 DIAGNOSIS — IMO0002 Reserved for concepts with insufficient information to code with codable children: Secondary | ICD-10-CM

## 2015-04-16 DIAGNOSIS — E1165 Type 2 diabetes mellitus with hyperglycemia: Principal | ICD-10-CM

## 2015-04-16 DIAGNOSIS — E1149 Type 2 diabetes mellitus with other diabetic neurological complication: Secondary | ICD-10-CM

## 2015-04-16 MED ORDER — INSULIN GLARGINE 100 UNIT/ML SOLOSTAR PEN: 16.0000 [IU] | PEN_INJECTOR | Freq: Every day | SUBCUTANEOUS | Status: DC

## 2015-04-16 MED ORDER — INSULIN PEN NEEDLE 32G X 4 MM MISC
Status: DC
Start: 2015-04-16 — End: 2016-07-27

## 2015-04-16 NOTE — Patient Instructions (Addendum)
Please stop Metformin.  Start Lantus 16 units at bedtime. If sugars are not <150 in am after 3 days, increase to 20 units.   When injecting insulin:  Inject in the abdomen  Rotate the injection sites around the belly button  Change needle for each injection  Keep needle in for 6-10 sec after last unit of insulin in  Keep the insulin in use out of the fridge  Please let me know if the sugars are consistently <80 or >200.  Please call me in 2 weeks with your sugars.  Please stop Boost.  PATIENT INSTRUCTIONS FOR TYPE 2 DIABETES:  **Please join MyChart!** - see attached instructions about how to join if you have not done so already.  DIET AND EXERCISE Diet and exercise is an important part of diabetic treatment.  We recommended aerobic exercise in the form of brisk walking (working between 40-60% of maximal aerobic capacity, similar to brisk walking) for 150 minutes per week (such as 30 minutes five days per week) along with 3 times per week performing 'resistance' training (using various gauge rubber tubes with handles) 5-10 exercises involving the major muscle groups (upper body, lower body and core) performing 10-15 repetitions (or near fatigue) each exercise. Start at half the above goal but build slowly to reach the above goals. If limited by weight, joint pain, or disability, we recommend daily walking in a swimming pool with water up to waist to reduce pressure from joints while allow for adequate exercise.    BLOOD GLUCOSES Monitoring your blood glucoses is important for continued management of your diabetes. Please check your blood glucoses 2-4 times a day: fasting, before meals and at bedtime (you can rotate these measurements - e.g. one day check before the 3 meals, the next day check before 2 of the meals and before bedtime, etc.).   HYPOGLYCEMIA (low blood sugar) Hypoglycemia is usually a reaction to not eating, exercising, or taking too much insulin/ other diabetes drugs.   Symptoms include tremors, sweating, hunger, confusion, headache, etc. Treat IMMEDIATELY with 15 grams of Carbs: . 4 glucose tablets .  cup regular juice/soda . 2 tablespoons raisins . 4 teaspoons sugar . 1 tablespoon honey Recheck blood glucose in 15 mins and repeat above if still symptomatic/blood glucose <100.  RECOMMENDATIONS TO REDUCE YOUR RISK OF DIABETIC COMPLICATIONS: * Take your prescribed MEDICATION(S) * Follow a DIABETIC diet: Complex carbs, fiber rich foods, (monounsaturated and polyunsaturated) fats * AVOID saturated/trans fats, high fat foods, >2,300 mg salt per day. * EXERCISE at least 5 times a week for 30 minutes or preferably daily.  * DO NOT SMOKE OR DRINK more than 1 drink a day. * Check your FEET every day. Do not wear tightfitting shoes. Contact us if you develop an ulcer * See your EYE doctor once a year or more if needed * Get a FLU shot once a year * Get a PNEUMONIA vaccine once before and once after age 43 years  GOALS:  * Your Hemoglobin A1c of <7%  * fasting sugars need to be <130 * after meals sugars need to be <180 (2h after you start eating) * Your Systolic BP should be 976 or lower  * Your Diastolic BP should be 80 or lower  * Your HDL (Good Cholesterol) should be 40 or higher  * Your LDL (Bad Cholesterol) should be 100 or lower. * Your Triglycerides should be 150 or lower  * Your Urine microalbumin (kidney function) should be <30 * Your Body  Mass Index should be 25 or lower    Please consider the following ways to cut down carbs and fat and increase fiber and micronutrients in your diet: - substitute whole grain for white bread or pasta - substitute brown rice for white rice - substitute 90-calorie flat bread pieces for slices of bread when possible - substitute sweet potatoes or yams for white potatoes - substitute humus for margarine - substitute tofu for cheese when possible - substitute almond or rice milk for regular milk (would not  drink soy milk daily due to concern for soy estrogen influence on breast cancer risk) - substitute dark chocolate for other sweets when possible - substitute water - can add lemon or orange slices for taste - for diet sodas (artificial sweeteners will trick your body that you can eat sweets without getting calories and will lead you to overeating and weight gain in the long run) - do not skip breakfast or other meals (this will slow down the metabolism and will result in more weight gain over time)  - can try smoothies made from fruit and almond/rice milk in am instead of regular breakfast - can also try old-fashioned (not instant) oatmeal made with almond/rice milk in am - order the dressing on the side when eating salad at a restaurant (pour less than half of the dressing on the salad) - eat as little meat as possible - can try juicing, but should not forget that juicing will get rid of the fiber, so would alternate with eating raw veg./fruits or drinking smoothies - use as little oil as possible, even when using olive oil - can dress a salad with a mix of balsamic vinegar and lemon juice, for e.g. - use agave nectar, stevia sugar, or regular sugar rather than artificial sweateners - steam or broil/roast veggies  - snack on veggies/fruit/nuts (unsalted, preferably) when possible, rather than processed foods - reduce or eliminate aspartame in diet (it is in diet sodas, chewing gum, etc) Read the labels!  Try to read Dr. Janene Harvey book: "Program for Reversing Diabetes" for other ideas for healthy eating.

## 2015-04-16 NOTE — Progress Notes (Addendum)
Patient ID: Cheryl Brown, female   DOB: August 13, 1935, 79 y.o.   MRN: [id number]  HPI: Cheryl Brown is a 79 y.o.-year-old female, referred by her PCP, Dr. Linna Darner, for management of DM2, dx in 77/2016, insulin-dependent, uncontrolled, with peripheral neuropathy.  Last hemoglobin A1c was: Lab Results  Component Value Date   HGBA1C 9.5* 12/26/2014   HGBA1C 7.2* 08/17/2014   HGBA1C 6.2 11/16/2013  She had a steroid inj 10/2014 >> sugars much higher then.  Pt is on a regimen of: - Metformin 500 mg 1x a day, with b'fast >> ran out 1 week ago. Even this dose is giving her nausea. She tried Repaglinide 1 mg - has some at home. She believes he was given her nausea, but she was taking it along with metformin.   Pt checks her sugars 1x a day and they are: - am: 202-388 - 2h after b'fast: 380 - before lunch: n/c - 2h after lunch: n/c - before dinner: n/c - 2h after dinner: n/c - bedtime: n/c - nighttime: n/c No lows. Lowest sugar was 202. Highest sugar was 410.  Glucometer: OneTouch 2  Pt's meals are: - Breakfast:cereals (cheerios) + 2% milk or eggs, bacon, yoghurt - Lunch: salad + chicken - may skip or snack  - Dinner: chicken/fish/stake - Snacks: 2-3: 1 Boost a day in last 2 months!  - no CKD, last BUN/creatinine:  Lab Results  Component Value Date   BUN 14 12/26/2014   CREATININE 0.69 12/26/2014  On Valsartan. - last set of lipids: Lab Results  Component Value Date   CHOL 212* 11/16/2013   HDL 53.80 11/16/2013   LDLCALC 132* 11/16/2013   LDLDIRECT 136.1 08/21/2011   TRIG 131.0 11/16/2013   CHOLHDL 4 11/16/2013   - last eye exam was in 2015. No DR.  - + numbness and tingling in her feet. On Clonazepam.  Pt has FH of DM in father, sister.  She has OSA >> on CPAP. She also has HTN, COPD, RLS, GERD.  ROS: Constitutional: + weight gain, + fatigue, + hot flushes, + poor sleep, + nocturia Eyes: + blurry vision, no xerophthalmia ENT: + sore throat, no nodules  palpated in throat, + dysphagia/no odynophagia, no hoarseness, + tinnitus, + hypoacusis Cardiovascular: no CP/+ SOB/+ palpitations/+ leg swelling Respiratory: + cough/+ SOB/+ wheezing Gastrointestinal: + N/+ V/+ D/+ C, + heartburn Musculoskeletal: + muscle aches/+ joint aches Skin: no rashes, + itching, + easy bruising Neurological: no tremors/numbness/tingling/dizziness, + HA Psychiatric: no depression/anxiety + low libido  Past Medical History  Diagnosis Date  . Hyperlipemia   . Reflux esophagitis   . Palpitations   . Dysmetabolic syndrome   . Hypertension   . IBS (irritable bowel syndrome)   . Restless leg syndrome   . Neoplasm of pituitary gland     Dr.Nudelman   . Scoliosis     Spinal Stenosis  . COPD (chronic obstructive pulmonary disease)   . Pituitary adenoma 1991    Dr.Love and Dr.Nudelman   . Shingles outbreak   . Diabetes mellitus   . Blood transfusion   . Recurrent UTI 2013-14    Dr. Thomasene Mohair and Dr. Marvel Plan   Past Surgical History  Procedure Laterality Date  . Partial hysterectomy      for dysfunctional bleeding   . Breast lumpectomy      4 lumps removed from breast, all benign   . Finger amputation      related to work injury   . Esophageal dilation  01/2003  . Ankle fracture surgery      left ankle  . Knee surgery  82/4235     complicated by difficult resuscitation post anesthesia   . G2 p2    . Pituitary adenoma      Dr. Erling Cruz  . Laparoscopic cholecystectomy  03/2011    [place], [place]  . Cystoscopy  2014     Dr Thomasene Mohair  . Urethral dilation  2014  . Colonoscopy  2014    [facility], [facility]  . Upper gi endoscopy  2014    gastric polyp   Social History   Social History  . Marital Status: married    Spouse Name: N/A  . Number of Children: 2   Occupational History  . Retired    Social History Main Topics  . Smoking status: Former Smoker    Quit date: 08/10/1978  . Smokeless tobacco: Never Used  . Alcohol Use: No  . Drug Use: No    Current Outpatient Prescriptions on File Prior to Visit  Medication Sig Dispense Refill  . clonazePAM (KLONOPIN) 0.5 MG tablet TAKE 1/2 - 1 TABLETS BY MOUTH AT BEDTIME AS NEEDED ONLY 30 tablet 0  . Fluticasone-Salmeterol (ADVAIR DISKUS) 100-50 MCG/DOSE AEPB Inhale 1 puff into the lungs every 12 (twelve) hours. 60 each 5  . glucose blood (ONE TOUCH ULTRA TEST) test strip 1 each by Other route daily. Use to check blood sugar daily Dx: E11.41 90 each 3  . metFORMIN (GLUCOPHAGE) 500 MG tablet Take 500 mg by mouth 2 (two) times daily.  0  . mupirocin ointment (BACTROBAN) 2 % Applied twice a day to the septum;NOT into eyes. 15 g 0  . Omeprazole (PRILOSEC PO) Take by mouth.    [name] DELICA LANCETS MISC Check blood sugar as directed 100 each 3  . polyethylene glycol powder (MIRALAX) powder Take 17 g by mouth. 2 capfuls daily per Urologist    . PREMARIN vaginal cream   11  . valsartan (DIOVAN) 160 MG tablet Take 1 tablet (160 mg total) by mouth daily. 30 tablet 5  . ALPRAZolam (XANAX) 0.5 MG tablet 1 or 2 tabs for sleep (Patient not taking: Reported on 04/16/2015) 60 tablet 5   No current facility-administered medications on file prior to visit.   Allergies  Allergen Reactions  . Morphine And Related Shortness Of Breath  . Codeine     nausea  . Ipratropium-Albuterol     REACTION: VERY NERVOUS  . Niaspan [Niacin]     Abdominal pain  . Norvasc [Amlodipine Besylate]     10/15/14 headache  . Pramipexole Dihydrochloride     Rash & itching  . Pregabalin     REACTION: swelling  . Propoxyphene N-Acetaminophen     REACTION: UPSET STOMACH  . Simvastatin     REACTION: Leg cramps  . Sulfonamide Derivatives    Family History  Problem Relation Age of Onset  . Bone cancer Father   . Prostate cancer Father   . Diabetes Father   . Coronary artery disease Father   . Cancer Father     bone, prostate  . Alzheimer's disease Mother   . Arthritis Mother   . Mental illness Mother     alzheimers   . Alzheimer's disease Maternal Aunt   . Mental illness Maternal Aunt     alzheimers  . Mitral valve prolapse Sister   . Alzheimer's disease Sister   . Hypertension Sister   . Diabetes Sister   .  Stroke Paternal Grandmother   . Osteoporosis Sister   . Mental illness Maternal Grandmother     alzheimers   PE: BP 118/68 mmHg  Pulse 61  Temp(Src) 97.8 F (36.6 C) (Oral)  Resp 12  Ht 5\' 3"  (1.6 m)  Wt 180 lb 6.4 oz (81.829 kg)  BMI 31.96 kg/m2  SpO2 95% Wt Readings from Last 3 Encounters:  04/16/15 180 lb 6.4 oz (81.829 kg)  03/25/15 185 lb 3.2 oz (84.006 kg)  03/08/15 180 lb (81.647 kg)   Constitutional: overweight, in NAD Eyes: PERRLA, EOMI, no exophthalmos ENT: moist mucous membranes, no thyromegaly, no cervical lymphadenopathy Cardiovascular: RRR, No MRG Respiratory: CTA B Gastrointestinal: abdomen soft, NT, ND, BS+ Musculoskeletal: R 3,4,5 fingers missing (work accident), strength intact in all 4 Skin: moist, warm, no rashes Neurological: no tremor with outstretched hands, DTR normal in all 4  ASSESSMENT: 1. DM2, insulin-dependent, uncontrolled, with complications -  Peripheral neuropathy  PLAN:  1. Patient with uncontrolled diabetes, on oral antidiabetic regimen (500 mg of metformin daily, the very small dose, since she cannot tolerate a higher dose) - now off for a week since she ran out of refills. Her sugars are very high and I discussed with patient and her husband that she appears to be glucotoxic. In this situation, insulin is recommended. I suggested basal insulin taken at bedtime. She agrees with this plan. I demonstrated injection technique and patient understood the instructions. Will start Lantus 16 units at bedtime - but I expect that she will need more soon. I also advised her to stop using Boost shakes for snacks. I explained that this is used for meal replacement, not snacking. Will take metformin off her list and she cannot tolerate it. If the above regimen  is not enough, we may also add Repaglinide - she has 1 mg tablets at home.  - I suggested to:  Patient Instructions  Please stop Metformin.  Start Lantus 16 units at bedtime. If sugars are not <150 in am after 3 days, increase to 20 units.   When injecting insulin:  Inject in the abdomen  Rotate the injection sites around the belly button  Change needle for each injection  Keep needle in for 6-10 sec after last unit of insulin in  Keep the insulin in use out of the fridge  Please let me know if the sugars are consistently <80 or >200.  Please call me in 2 weeks with your sugars.  Please stop Boost.  - Strongly advised her to start checking sugars at different times of the day - check 2 times a day, rotating checks - given sugar log and advised how to fill it and to bring it at next appt  - given foot care handout and explained the principles  - given instructions for hypoglycemia management "15-15 rule"  - advised for yearly eye exams She is up-to-date>>  - Return to clinic in 1.5 mo with sugar log

## 2015-04-17 ENCOUNTER — Ambulatory Visit: Payer: PPO | Admitting: Internal Medicine

## 2015-04-17 ENCOUNTER — Telehealth: Payer: Self-pay | Admitting: Internal Medicine

## 2015-04-17 DIAGNOSIS — G4733 Obstructive sleep apnea (adult) (pediatric): Secondary | ICD-10-CM

## 2015-04-17 NOTE — Telephone Encounter (Signed)
ATC pt. Line rang without VM. WCB. 

## 2015-04-18 NOTE — Telephone Encounter (Signed)
Called # provided is line rings busy. Called alternate # and LMTCB x1

## 2015-04-19 NOTE — Telephone Encounter (Signed)
lmtcb for pt.  

## 2015-04-19 NOTE — Telephone Encounter (Signed)
Pt returned call [phone]

## 2015-04-19 NOTE — Telephone Encounter (Signed)
Patient returned call, can be reached at [phone] or [phone] (cell).

## 2015-04-19 NOTE — Telephone Encounter (Signed)
Called pt. Aware of recs. Order placed to [facility] to d/c CPAP. Nothing further needed

## 2015-04-19 NOTE — Telephone Encounter (Signed)
Ok to DC CPAP. Best alternatives for her- weight loss and try to sleep off flat of back

## 2015-04-19 NOTE — Telephone Encounter (Signed)
Called spoke with pt. She reports she has not been able to tolerate the CPAP. She is on the 2nd mask now. It wakes her up off and on all night. She wants to d/c the CPAP. Please advise Dr. Annamaria Boots thanks

## 2015-04-19 NOTE — Telephone Encounter (Signed)
LMTC x 1  

## 2015-04-30 ENCOUNTER — Telehealth: Payer: Self-pay | Admitting: Internal Medicine

## 2015-04-30 ENCOUNTER — Other Ambulatory Visit: Payer: Self-pay | Admitting: Internal Medicine

## 2015-04-30 NOTE — Telephone Encounter (Signed)
Sugars are, indeed improving. Let's add Glipizide XL 5 mg in am, before b'fast. Please do not skip meals while on this. Stay on 20 units on Lantus for now and let us know about the sugars in another week.

## 2015-04-30 NOTE — Telephone Encounter (Signed)
Returned pt's call. Pt gave b/s readings:  9/14  9/15  9/16  9/17  9/18  9/19 AM 301 AM 329 AM 162 AM 166 AM 130 AM 167 PM 423 PM 432 PM 219   PM 221 PM 231  Pt decided to increase her Lantus to 20 units on 9/15. Pt stated her b/s seems to be better than it was. Please advise of any further medication changes.

## 2015-04-30 NOTE — Telephone Encounter (Signed)
Patient Is calling to give ypu her B/S readings

## 2015-05-01 ENCOUNTER — Other Ambulatory Visit: Payer: Self-pay | Admitting: Emergency Medicine

## 2015-05-01 MED ORDER — GLIPIZIDE ER 5 MG PO TB24: 5.0000 mg | ORAL_TABLET | Freq: Every day | ORAL | Status: DC

## 2015-05-01 MED ORDER — CLONAZEPAM 0.5 MG PO TABS: ORAL_TABLET | ORAL | Status: DC

## 2015-05-01 NOTE — Telephone Encounter (Signed)
Klonopin faxed to [facility]

## 2015-05-01 NOTE — Telephone Encounter (Signed)
Called pt and advised her per Dr Arman Filter message below. Pt voiced understanding. Sent Glipizide XL 5 mg to pt's pharmacy.

## 2015-05-15 ENCOUNTER — Ambulatory Visit (INDEPENDENT_AMBULATORY_CARE_PROVIDER_SITE_OTHER): Payer: PPO

## 2015-05-15 DIAGNOSIS — Z23 Encounter for immunization: Secondary | ICD-10-CM | POA: Diagnosis not present

## 2015-05-28 ENCOUNTER — Encounter: Payer: Self-pay | Admitting: Internal Medicine

## 2015-05-28 ENCOUNTER — Ambulatory Visit (INDEPENDENT_AMBULATORY_CARE_PROVIDER_SITE_OTHER): Payer: PPO | Admitting: Internal Medicine

## 2015-05-28 ENCOUNTER — Other Ambulatory Visit (INDEPENDENT_AMBULATORY_CARE_PROVIDER_SITE_OTHER): Payer: PPO | Admitting: *Deleted

## 2015-05-28 VITALS — BP 124/68 | HR 74 | Temp 97.9°F | Resp 12 | Wt 186.8 lb

## 2015-05-28 DIAGNOSIS — E1149 Type 2 diabetes mellitus with other diabetic neurological complication: Secondary | ICD-10-CM

## 2015-05-28 DIAGNOSIS — IMO0002 Reserved for concepts with insufficient information to code with codable children: Secondary | ICD-10-CM

## 2015-05-28 DIAGNOSIS — E1165 Type 2 diabetes mellitus with hyperglycemia: Secondary | ICD-10-CM

## 2015-05-28 DIAGNOSIS — Z794 Long term (current) use of insulin: Secondary | ICD-10-CM | POA: Diagnosis not present

## 2015-05-28 LAB — LIPID PANEL
Cholesterol: 196 mg/dL (ref 0–200)
HDL: 47.5 mg/dL (ref >39.00)
NONHDL: 148.46
Total CHOL/HDL Ratio: 4
Triglycerides: 293 mg/dL — ABNORMAL HIGH (ref 0.0–149.0)
VLDL: 58.6 mg/dL — AB (ref 0.0–40.0)

## 2015-05-28 LAB — POCT GLYCOSYLATED HEMOGLOBIN (HGB A1C): HEMOGLOBIN A1C: 9.2

## 2015-05-28 LAB — LDL CHOLESTEROL, DIRECT: LDL DIRECT: 122 mg/dL

## 2015-05-28 MED ORDER — GLIPIZIDE ER 5 MG PO TB24: 5.0000 mg | ORAL_TABLET | Freq: Every day | ORAL | Status: DC

## 2015-05-28 NOTE — Progress Notes (Addendum)
Patient ID: Cheryl Brown, female   DOB: Jul 03, 1936, 79 y.o.   MRN: [id number]  HPI: Cheryl Brown is a 79 y.o.-year-old female, returning for follow-up for DM2, dx in 08/2014, insulin-dependent, uncontrolled, with peripheral neuropathy. Last visit 1.5 months ago. She is here with her husband.  Last hemoglobin A1c was: Lab Results  Component Value Date   HGBA1C 9.5* 12/26/2014   HGBA1C 7.2* 08/17/2014   HGBA1C 6.2 11/16/2013  She had a steroid inj 10/2014 >> sugars much higher then.  Pt was on a regimen of: - Metformin 500 mg 1x a day, with b'fast >> ran out 1 week ago. Even this dose is giving her nausea. She tried Repaglinide 1 mg - has some at home. She believes he was given her nausea, but she was taking it along with metformin.   She is now on: - Lantus 20 units at bedtime - Glipizide XL 5 mg in a.m. We stopped metformin at last visit since she could not tolerate it because of GI symptoms.  Pt checks her sugars 1x a day and they are MUCH BETER: - am: 202-388 >> 95-142 - 2h after b'fast: 380 >> 135-163 - before lunch: n/c >> 63, 83 - 2h after lunch: n/c - before dinner: n/c >> 113 - 2h after dinner: n/c >> 164-204 - bedtime: n/c >> 110-181 - nighttime: n/c >> 149, 179 No lows. Lowest sugar was 202. Highest sugar was in the 400s right after last OV.  Glucometer: OneTouch 2  Pt's meals are: - Breakfast:cereals (cheerios) + 2% milk or eggs, bacon, yoghurt - Lunch: salad + chicken - may skip or snack  - Dinner: chicken/fish/stake - Snacks: Was drinking Boost for snacks, which I advised her to stop  - no CKD, last BUN/creatinine:  Lab Results  Component Value Date   BUN 14 12/26/2014   CREATININE 0.69 12/26/2014  On Valsartan. - last set of lipids: Lab Results  Component Value Date   CHOL 212* 11/16/2013   HDL 53.80 11/16/2013   LDLCALC 132* 11/16/2013   LDLDIRECT 136.1 08/21/2011   TRIG 131.0 11/16/2013   CHOLHDL 4 11/16/2013   - last eye exam was in  2015. Dr Tommy Rainwater. No DR.  - + numbness and tingling in her feet. On Clonazepam.  She has OSA >> on CPAP. She also has HTN, COPD, RLS, GERD.  ROS: Constitutional: + weight gain, + fatigue, + hot flushes, no nocturia Eyes: + blurry vision, no xerophthalmia ENT: no sore throat, no nodules palpated in throat, + dysphagia/no odynophagia, no hoarseness Cardiovascular: no CP/+ SOB/no  palpitations/+ leg swelling Respiratory: no cough/+ SOB/no wheezing Gastrointestinal: + N/no V/D/+ C, no heartburn Musculoskeletal: no muscle aches/joint aches Skin: no rashes, + itching, + easy bruising Neurological: + tremors/no numbness/tingling/dizziness, + HA  I reviewed pt's medications, allergies, PMH, social hx, family hx, and changes were documented in the history of present illness. Otherwise, unchanged from my initial visit note.  Past Medical History  Diagnosis Date  . Hyperlipemia   . Reflux esophagitis   . Palpitations   . Dysmetabolic syndrome   . Hypertension   . IBS (irritable bowel syndrome)   . Restless leg syndrome   . Neoplasm of pituitary gland     Dr.Nudelman   . Scoliosis     Spinal Stenosis  . COPD (chronic obstructive pulmonary disease)   . Pituitary adenoma 1991    Dr.Love and Dr.Nudelman   . Shingles outbreak   . Diabetes mellitus   .  Blood transfusion   . Recurrent UTI 2013-14    Dr. Thomasene Mohair and Dr. Marvel Plan   Past Surgical History  Procedure Laterality Date  . Partial hysterectomy      for dysfunctional bleeding   . Breast lumpectomy      4 lumps removed from breast, all benign   . Finger amputation      related to work injury   . Esophageal dilation  01/2003  . Ankle fracture surgery      left ankle  . Knee surgery  27/7412     complicated by difficult resuscitation post anesthesia   . G2 p2    . Pituitary adenoma      Dr. Erling Cruz  . Laparoscopic cholecystectomy  03/2011    [place], [place]  . Cystoscopy  2014     Dr Thomasene Mohair  . Urethral dilation  2014   . Colonoscopy  2014    [facility], [facility]  . Upper gi endoscopy  2014    gastric polyp   Social History   Social History  . Marital Status: married    Spouse Name: N/A  . Number of Children: 2   Occupational History  . Retired    Social History Main Topics  . Smoking status: Former Smoker    Quit date: 08/10/1978  . Smokeless tobacco: Never Used  . Alcohol Use: No  . Drug Use: No   Current Outpatient Prescriptions on File Prior to Visit  Medication Sig Dispense Refill  . ALPRAZolam (XANAX) 0.5 MG tablet 1 or 2 tabs for sleep (Patient not taking: Reported on 04/16/2015) 60 tablet 5  . clonazePAM (KLONOPIN) 0.5 MG tablet TAKE 1/2 - 1 TABLETS BY MOUTH AT BEDTIME AS NEEDED ONLY--- Pt needs new PCP by Oct 1st 30 tablet 0  . Fluticasone-Salmeterol (ADVAIR DISKUS) 100-50 MCG/DOSE AEPB Inhale 1 puff into the lungs every 12 (twelve) hours. 60 each 5  . glipiZIDE (GLIPIZIDE XL) 5 MG 24 hr tablet Take 1 tablet (5 mg total) by mouth daily with breakfast. 30 tablet 2  . glucose blood (ONE TOUCH ULTRA TEST) test strip 1 each by Other route daily. Use to check blood sugar daily Dx: E11.41 90 each 3  . Insulin Glargine (LANTUS SOLOSTAR) 100 UNIT/ML Solostar Pen Inject 16 Units into the skin at bedtime. 5 pen 2  . Insulin Pen Needle (CLICKFINE PEN NEEDLES) 32G X 4 MM MISC Use 1x a day 100 each 11  . mupirocin ointment (BACTROBAN) 2 % Applied twice a day to the septum;NOT into eyes. 15 g 0  . Omeprazole (PRILOSEC PO) Take by mouth.    [name] DELICA LANCETS MISC Check blood sugar as directed 100 each 3  . polyethylene glycol powder (MIRALAX) powder Take 17 g by mouth. 2 capfuls daily per Urologist    . PREMARIN vaginal cream   11  . valsartan (DIOVAN) 160 MG tablet Take 1 tablet (160 mg total) by mouth daily. 30 tablet 5   No current facility-administered medications on file prior to visit.   Allergies  Allergen Reactions  . Morphine And Related Shortness Of Breath  . Codeine      nausea  . Ipratropium-Albuterol     REACTION: VERY NERVOUS  . Niaspan [Niacin]     Abdominal pain  . Norvasc [Amlodipine Besylate]     10/15/14 headache  . Pramipexole Dihydrochloride     Rash & itching  . Pregabalin     REACTION: swelling  . Propoxyphene N-Acetaminophen  REACTION: UPSET STOMACH  . Simvastatin     REACTION: Leg cramps  . Sulfonamide Derivatives    Family History  Problem Relation Age of Onset  . Bone cancer Father   . Prostate cancer Father   . Diabetes Father   . Coronary artery disease Father   . Cancer Father     bone, prostate  . Alzheimer's disease Mother   . Arthritis Mother   . Mental illness Mother     alzheimers  . Alzheimer's disease Maternal Aunt   . Mental illness Maternal Aunt     alzheimers  . Mitral valve prolapse Sister   . Alzheimer's disease Sister   . Hypertension Sister   . Diabetes Sister   . Stroke Paternal Grandmother   . Osteoporosis Sister   . Mental illness Maternal Grandmother     alzheimers   PE: BP 124/68 mmHg  Pulse 74  Temp(Src) 97.9 F (36.6 C) (Oral)  Resp 12  Wt 186 lb 12.8 oz (84.732 kg)  SpO2 94% Body mass index is 33.1 kg/(m^2). Wt Readings from Last 3 Encounters:  05/28/15 186 lb 12.8 oz (84.732 kg)  04/16/15 180 lb 6.4 oz (81.829 kg)  03/25/15 185 lb 3.2 oz (84.006 kg)   Constitutional: overweight, in NAD Eyes: PERRLA, EOMI, no exophthalmos ENT: moist mucous membranes, no thyromegaly, no cervical lymphadenopathy Cardiovascular: RRR, No MRG Respiratory: CTA B Gastrointestinal: abdomen soft, NT, ND, BS+ Musculoskeletal: R 3,4,5 fingers missing (work accident), strength intact in all 4 Skin: moist, warm, no rashes Neurological: no tremor with outstretched hands, DTR normal in all 4  ASSESSMENT: 1. DM2, insulin-dependent, uncontrolled, with complications -  Peripheral neuropathy  PLAN:  1. Patient with uncontrolled diabetes, now [facility] improved after adding basal insulin and starting glipizide  extended-release in a.m. We had to stop metformin due to GI intolerance. She had 2 low CBGs in the 60s when she delayed a meal >> advised her to carry a snack with her. For now, will continue current regimen, but may need to decrease Lantus or Glipizide if she continues to have lows. I advised her to let me know. - I suggested to:  Patient Instructions  Please continue: - Lantus 20 units at bedtime - Glipizide XL 5 mg in am  Please let me know if the sugars are consistently <80 or >200.  If you have to delay a meal, please carry a snack with you.  Please stop at the lab.  Please come back for a follow-up appointment in 3 months.   - Continue checking sugars at different times of the day - check 2 times a day, rotating checks - will check HbA1c today >> 9.2% (slightly better) - advised for yearly eye exams >> She is up-to-date - Return to clinic in 3 mo with sugar log   Orders Only on 05/28/2015  Component Date Value Ref Range Status  . Hemoglobin A1C 05/28/2015 9.2   Final  Office Visit on 05/28/2015  Component Date Value Ref Range Status  . Cholesterol 05/28/2015 196  0 - 200 mg/dL Final   ATP III Classification       Desirable:  < 200 mg/dL               Borderline High:  200 - 239 mg/dL          High:  > = 240 mg/dL  . Triglycerides 05/28/2015 293.0* 0.0 - 149.0 mg/dL Final   Normal:  <150 mg/dLBorderline High:  150 - 199 mg/dL  .  HDL 05/28/2015 47.50  >39.00 mg/dL Final  . VLDL 05/28/2015 58.6* 0.0 - 40.0 mg/dL Final  . Total CHOL/HDL Ratio 05/28/2015 4   Final                  Men          Women1/2 Average Risk     3.4          3.3Average Risk          5.0          4.42X Average Risk          9.6          7.13X Average Risk          15.0          11.0                      . NonHDL 05/28/2015 148.46   Final   NOTE:  Non-HDL goal should be 30 mg/dL higher than patient's LDL goal (i.e. LDL goal of < 70 mg/dL, would have non-HDL goal of < 100 mg/dL)  . Direct LDL 05/28/2015 122.0    Final   Optimal:  <100 mg/dLNear or Above Optimal:  100-129 mg/dLBorderline High:  130-159 mg/dLHigh:  160-189 mg/dLVery High:  >190 mg/dL   Triglycerides are high, but the sample was obtained after lunch. Will advise the patient to decrease the intake of concentrated sweets and fatty foods. LDL has improved.

## 2015-05-28 NOTE — Patient Instructions (Addendum)
Please continue: - Lantus 20 units at bedtime - Glipizide XL 5 mg in am  Please let me know if the sugars are consistently <80 or >200.  If you have to delay a meal, please carry a snack with you.  Please stop at the lab.  Please come back for a follow-up appointment in 3 months.

## 2015-06-24 ENCOUNTER — Ambulatory Visit: Payer: PPO | Admitting: Internal Medicine

## 2015-07-08 ENCOUNTER — Telehealth: Payer: Self-pay | Admitting: Internal Medicine

## 2015-07-08 MED ORDER — GLIPIZIDE ER 2.5 MG PO TB24: 2.5000 mg | ORAL_TABLET | Freq: Every day | ORAL | Status: DC

## 2015-07-08 NOTE — Telephone Encounter (Signed)
Patient stated that her b/s drops during the morning to lunchtime to 64, and this afternoon it is 74, is that to low , please advise

## 2015-07-08 NOTE — Telephone Encounter (Signed)
Called pt and advised her per Dr Arman Filter message. Sent new rx for 2.5 mg into pharmacy and took off the 5 mg. Pt voiced understanding.

## 2015-07-08 NOTE — Telephone Encounter (Signed)
Please read message below and advise.  

## 2015-07-08 NOTE — Telephone Encounter (Signed)
Yes, that is fairly low >> let's stop the Glipizide ER 5 mg daily in am (please take it off her list) and start Glipizide ER 2.5 mg daily in am. She should not cut the 5 mg tabs.

## 2015-07-16 ENCOUNTER — Other Ambulatory Visit: Payer: Self-pay

## 2015-07-16 DIAGNOSIS — Z1231 Encounter for screening mammogram for malignant neoplasm of breast: Secondary | ICD-10-CM

## 2015-07-26 ENCOUNTER — Ambulatory Visit (INDEPENDENT_AMBULATORY_CARE_PROVIDER_SITE_OTHER): Payer: PPO | Admitting: Cardiovascular Disease

## 2015-07-26 ENCOUNTER — Encounter: Payer: Self-pay | Admitting: Cardiovascular Disease

## 2015-07-26 VITALS — BP 150/86 | HR 67 | Ht 63.0 in | Wt 198.1 lb

## 2015-07-26 DIAGNOSIS — R0602 Shortness of breath: Secondary | ICD-10-CM | POA: Diagnosis not present

## 2015-07-26 DIAGNOSIS — I10 Essential (primary) hypertension: Secondary | ICD-10-CM

## 2015-07-26 DIAGNOSIS — M79606 Pain in leg, unspecified: Secondary | ICD-10-CM

## 2015-07-26 NOTE — Patient Instructions (Signed)
Medication Instructions:  Your physician recommends that you continue on your current medications as directed. Please refer to the Current Medication list given to you today.  Labwork: No new orders.   Testing/Procedures: Your physician has requested that you have an echocardiogram. Echocardiography is a painless test that uses sound waves to create images of your heart. It provides your doctor with information about the size and shape of your heart and how well your heart's chambers and valves are working. This procedure takes approximately one hour. There are no restrictions for this procedure.  Your physician has requested that you have an ankle brachial index (ABI). During this test an ultrasound and blood pressure cuff are used to evaluate the arteries that supply the arms and legs with blood. Allow thirty minutes for this exam. There are no restrictions or special instructions.  Your physician has requested that you regularly monitor and record your blood pressure readings at home. Please use the same machine at the same time of day to check your readings and record them.  Please call the office in 2 WEEKS with your BP readings.  Follow-Up: Your physician wants you to follow-up in: 6 MONTHS with Dr Burt Knack.  You will receive a reminder letter in the mail two months in advance. If you don't receive a letter, please call our office to schedule the follow-up appointment.   Any Other Special Instructions Will Be Listed Below (If Applicable).     If you need a refill on your cardiac medications before your next appointment, please call your pharmacy.

## 2015-07-26 NOTE — Progress Notes (Signed)
Cardiology Office Note Date:  07/27/2015   ID:  Cheryl Brown, DOB April 26, 1936, MRN [id number]  PCP:  Velna Hatchet, MD  Cardiologist:  Sherren Mocha, MD    Chief Complaint  Patient presents with  . Shortness of Breath   History of Present Illness: Cheryl Brown is a 79 y.o. female who presents for initial cardiac evaluation. The patient has type 2 diabetes, hypertension, and hyperlipidemia.  Biggest problem/limitation related to feet and leg pain. She has weakness, neuropathic pain. Has had both knees replaced. States she just 'gives out.' Admits to being sedentary because of weakness.   She complains of dyspnea with exertion. This has progressed over the past few years. Was previously walking every day but hasn't been doing this in a long time because of leg pain and shortness of breath. No chest pain or pressure. No palpitations, lightheadedness, or syncope. She denies orthopnea, PND, or cough.  Past Medical History  Diagnosis Date  . Hyperlipemia   . Reflux esophagitis   . Palpitations   . Dysmetabolic syndrome   . Hypertension   . IBS (irritable bowel syndrome)   . Restless leg syndrome   . Neoplasm of pituitary gland ([place])     Dr.Nudelman   . Scoliosis     Spinal Stenosis  . COPD (chronic obstructive pulmonary disease) ([place])   . Pituitary adenoma ([place]) 1991    Dr.Love and Dr.Nudelman   . Shingles outbreak   . Diabetes mellitus   . Blood transfusion   . Recurrent UTI 2013-14    Dr. Thomasene Mohair and Dr. Marvel Plan    Past Surgical History  Procedure Laterality Date  . Partial hysterectomy      for dysfunctional bleeding   . Breast lumpectomy      4 lumps removed from breast, all benign   . Finger amputation      related to work injury   . Esophageal dilation  01/2003  . Ankle fracture surgery      left ankle  . Knee surgery  [id number]     complicated by difficult resuscitation post anesthesia   . G2 p2    . Pituitary adenoma      Dr. Erling Cruz  .  Laparoscopic cholecystectomy  03/2011    [place], [place]  . Cystoscopy  2014     Dr Thomasene Mohair  . Urethral dilation  2014  . Colonoscopy  2014    [facility], [facility]  . Upper gi endoscopy  2014    gastric polyp    Current Outpatient Prescriptions  Medication Sig Dispense Refill  . clonazePAM (KLONOPIN) 0.5 MG tablet TAKE 1/2 - 1 TABLETS BY MOUTH AT BEDTIME AS NEEDED ONLY--- Pt needs new PCP by Oct 1st 30 tablet 0  . Fluticasone-Salmeterol (ADVAIR DISKUS) 100-50 MCG/DOSE AEPB Inhale 1 puff into the lungs every 12 (twelve) hours. 60 each 5  . glipiZIDE (GLUCOTROL XL) 2.5 MG 24 hr tablet Take 1 tablet (2.5 mg total) by mouth daily with breakfast. 30 tablet 2  . glucose blood (ONE TOUCH ULTRA TEST) test strip 1 each by Other route daily. Use to check blood sugar daily Dx: E11.41 90 each 3  . Insulin Glargine (LANTUS SOLOSTAR) 100 UNIT/ML Solostar Pen Inject 16 Units into the skin at bedtime. (Patient taking differently: Inject 20 Units into the skin at bedtime. ) 5 pen 2  . Insulin Pen Needle (CLICKFINE PEN NEEDLES) 32G X 4 MM MISC Use 1x a day 100 each 11  . mupirocin ointment (  BACTROBAN) 2 % Applied twice a day to the septum;NOT into eyes. 15 g 0  . Omeprazole (PRILOSEC PO) Take 20 mg by mouth daily.     [name] DELICA LANCETS MISC Check blood sugar as directed 100 each 3  . polyethylene glycol powder (MIRALAX) powder Take 17 g by mouth. 2 capfuls daily per Urologist    . PREMARIN vaginal cream   11  . valsartan (DIOVAN) 160 MG tablet Take 1 tablet (160 mg total) by mouth daily. 30 tablet 5   No current facility-administered medications for this visit.    Allergies:   Morphine and related; Codeine; Ipratropium-albuterol; Niaspan; Norvasc; Pramipexole dihydrochloride; Pregabalin; Propoxyphene n-acetaminophen; Simvastatin; and Sulfonamide derivatives   Social History:  The patient  reports that she quit smoking about 36 years ago. She has never used smokeless tobacco. She reports that  she does not drink alcohol or use illicit drugs.   Family History:  The patient's family history includes Alzheimer's disease in her maternal aunt, mother, and sister; Arthritis in her mother; Bone cancer in her father; Cancer in her father; Coronary artery disease in her father; Diabetes in her father and sister; Hypertension in her sister; Mental illness in her maternal aunt, maternal grandmother, and mother; Mitral valve prolapse in her sister; Osteoporosis in her sister; Prostate cancer in her father; Stroke in her paternal grandmother.    ROS:  Please see the history of present illness.  Otherwise, review of systems is positive for leg swelling, hearing loss, easy bruising, leg pain, snoring, wheezing, nausea.  All other systems are reviewed and negative.    PHYSICAL EXAM: VS:  BP 150/86 mmHg  Pulse 67  Ht 5\' 3"  (1.6 m)  Wt 198 lb 1.9 oz (89.867 kg)  BMI 35.10 kg/m2 , BMI Body mass index is 35.1 kg/(m^2). GEN: Well nourished, well developed, pleasant overweight woman in no acute distress HEENT: normal Neck: no JVD, no masses. No carotid bruits Cardiac: RRR without murmur or gallop                Respiratory:  clear to auscultation bilaterally, normal work of breathing GI: soft, nontender, nondistended, + BS MS: no deformity or atrophy Ext: no pretibial edema, pedal pulses 2+= bilaterally Skin: warm and dry, no rash Neuro:  Strength and sensation are intact Psych: euthymic mood, full affect  EKG:  EKG is ordered today. The ekg ordered today shows NSR 63 bpm, incomplete RBBB  Recent Labs: 08/17/2014: TSH 2.08 10/30/2014: Hemoglobin 13.8; Platelets 220.0 12/26/2014: BUN 14; Creatinine, Ser 0.69; Potassium 5.0; Sodium 133*   Lipid Panel     Component Value Date/Time   CHOL 196 05/28/2015 1416   TRIG 293.0* 05/28/2015 1416   HDL 47.50 05/28/2015 1416   CHOLHDL 4 05/28/2015 1416   VLDL 58.6* 05/28/2015 1416   LDLCALC 132* 11/16/2013 1418   LDLDIRECT 122.0 05/28/2015 1416       Wt Readings from Last 3 Encounters:  07/26/15 198 lb 1.9 oz (89.867 kg)  05/28/15 186 lb 12.8 oz (84.732 kg)  04/16/15 180 lb 6.4 oz (81.829 kg)     Cardiac Studies Reviewed: none  ASSESSMENT AND PLAN: 1.  Shortness of breath, uncertain etiology. Suspect combination of deconditioning, obesity, possibly diastolic dysfunction. Will check 2D Echo for assessment of LV systolic and diastolic dysfunction.  2. Essential hypertension, uncontrolled. Recommend home BP monitoring. Record BP's and call office in 2 weeks with readings. FU 6 months.  3. Leg pain/weakness: check ABI's to assess for vascular  obstructive disease in setting of diabetes.  4. Type 2 DM: per primary care. Discussed diet/lifestyle modification.  Current medicines are reviewed with the patient today.  The patient does not have concerns regarding medicines.  Labs/ tests ordered today include:   Orders Placed This Encounter  Procedures  . EKG 12-Lead  . Echocardiogram    Disposition:   FU 6 months  Signed, Sherren Mocha, MD  07/27/2015 9:55 PM    [address], [place], [place]  [address] Phone: [phone]; Fax: [phone]

## 2015-07-29 ENCOUNTER — Other Ambulatory Visit: Payer: Self-pay | Admitting: Cardiovascular Disease

## 2015-07-29 DIAGNOSIS — I739 Peripheral vascular disease, unspecified: Secondary | ICD-10-CM

## 2015-07-29 DIAGNOSIS — M79606 Pain in leg, unspecified: Secondary | ICD-10-CM

## 2015-08-01 ENCOUNTER — Ambulatory Visit (HOSPITAL_COMMUNITY)
Admission: RE | Admit: 2015-08-01 | Discharge: 2015-08-01 | Disposition: A | Discharge status: Home / Self Care | Payer: PPO | Source: Ambulatory Visit | Attending: Cardiovascular Disease | Admitting: Cardiovascular Disease

## 2015-08-01 DIAGNOSIS — M79606 Pain in leg, unspecified: Secondary | ICD-10-CM | POA: Diagnosis present

## 2015-08-01 DIAGNOSIS — E785 Hyperlipidemia, unspecified: Secondary | ICD-10-CM | POA: Diagnosis not present

## 2015-08-01 DIAGNOSIS — E119 Type 2 diabetes mellitus without complications: Secondary | ICD-10-CM | POA: Insufficient documentation

## 2015-08-01 DIAGNOSIS — I10 Essential (primary) hypertension: Secondary | ICD-10-CM | POA: Insufficient documentation

## 2015-08-01 DIAGNOSIS — I739 Peripheral vascular disease, unspecified: Secondary | ICD-10-CM

## 2015-08-01 DIAGNOSIS — R229 Localized swelling, mass and lump, unspecified: Secondary | ICD-10-CM | POA: Diagnosis not present

## 2015-08-08 ENCOUNTER — Other Ambulatory Visit: Payer: Self-pay | Admitting: Internal Medicine

## 2015-08-13 ENCOUNTER — Ambulatory Visit (HOSPITAL_COMMUNITY): Payer: PPO | Attending: Cardiovascular Disease

## 2015-08-13 ENCOUNTER — Other Ambulatory Visit: Payer: Self-pay

## 2015-08-13 DIAGNOSIS — R0600 Dyspnea, unspecified: Secondary | ICD-10-CM | POA: Diagnosis not present

## 2015-08-13 DIAGNOSIS — R0602 Shortness of breath: Secondary | ICD-10-CM | POA: Insufficient documentation

## 2015-08-13 DIAGNOSIS — I340 Nonrheumatic mitral (valve) insufficiency: Secondary | ICD-10-CM | POA: Diagnosis not present

## 2015-08-13 DIAGNOSIS — E119 Type 2 diabetes mellitus without complications: Secondary | ICD-10-CM | POA: Insufficient documentation

## 2015-08-13 DIAGNOSIS — E785 Hyperlipidemia, unspecified: Secondary | ICD-10-CM | POA: Diagnosis not present

## 2015-08-13 DIAGNOSIS — I517 Cardiomegaly: Secondary | ICD-10-CM | POA: Insufficient documentation

## 2015-08-13 DIAGNOSIS — E669 Obesity, unspecified: Secondary | ICD-10-CM | POA: Insufficient documentation

## 2015-08-13 DIAGNOSIS — Z6835 Body mass index (BMI) 35.0-35.9, adult: Secondary | ICD-10-CM | POA: Diagnosis not present

## 2015-08-13 DIAGNOSIS — I10 Essential (primary) hypertension: Secondary | ICD-10-CM | POA: Insufficient documentation

## 2015-08-14 ENCOUNTER — Telehealth: Payer: Self-pay | Admitting: Cardiovascular Disease

## 2015-08-14 NOTE — Telephone Encounter (Signed)
Walk in pt form-BP readings- dropped gave to [facility]

## 2015-08-16 ENCOUNTER — Ambulatory Visit
Admission: RE | Admit: 2015-08-16 | Discharge: 2015-08-16 | Disposition: A | Discharge status: Home / Self Care | Payer: PPO | Source: Ambulatory Visit

## 2015-08-16 DIAGNOSIS — Z1231 Encounter for screening mammogram for malignant neoplasm of breast: Secondary | ICD-10-CM

## 2015-08-18 ENCOUNTER — Encounter: Payer: Self-pay | Admitting: Cardiovascular Disease

## 2015-08-18 NOTE — Progress Notes (Signed)
Home BP's reviewed and they are above goal. She takes diovan 160 mg daily. Recommend add amlodipine 5 mg daily.

## 2015-08-19 ENCOUNTER — Other Ambulatory Visit: Payer: Self-pay | Admitting: Obstetrics and Gynecology

## 2015-08-19 DIAGNOSIS — N644 Mastodynia: Secondary | ICD-10-CM

## 2015-08-19 DIAGNOSIS — R234 Changes in skin texture: Secondary | ICD-10-CM

## 2015-08-20 ENCOUNTER — Telehealth: Payer: Self-pay

## 2015-08-20 MED ORDER — VALSARTAN 160 MG PO TABS: 160.0000 mg | ORAL_TABLET | Freq: Every day | ORAL | Status: DC

## 2015-08-20 MED ORDER — AMLODIPINE BESYLATE 5 MG PO TABS: 5.0000 mg | ORAL_TABLET | Freq: Every day | ORAL | Status: DC

## 2015-08-20 NOTE — Telephone Encounter (Signed)
Sherren Mocha, MD at 08/18/2015 2:18 PM     Status: Signed       Expand All Collapse All   Home BP's reviewed and they are above goal. She takes diovan 160 mg daily. Recommend add amlodipine 5 mg daily.         I spoke with the pt and made her aware of Echo results.  The pt requested a refill on Diovan and I made her aware that Dr Burt Knack would also like her to start Amlodipine 5mg  daily.  I advised the pt to start checking her BP after she has been on both medications for one week and then monitor this for 2-3 weeks and send readings into our office. Pt verbalized understanding of plan.   I sent Rx into the pharmacy and a warning did come up for the pt having amlodipine listed as an allergy with side effect headache.  I called the pt again and she said that she will try amlodipine again and see if she can take it this time.  If the pt has any issues with medication then she will contact the office.

## 2015-08-26 ENCOUNTER — Ambulatory Visit
Admission: RE | Admit: 2015-08-26 | Discharge: 2015-08-26 | Disposition: A | Discharge status: Home / Self Care | Payer: PPO | Source: Ambulatory Visit | Attending: Obstetrics and Gynecology | Admitting: Obstetrics and Gynecology

## 2015-08-26 ENCOUNTER — Telehealth: Payer: Self-pay | Admitting: Cardiovascular Disease

## 2015-08-26 DIAGNOSIS — N6012 Diffuse cystic mastopathy of left breast: Secondary | ICD-10-CM | POA: Diagnosis not present

## 2015-08-26 DIAGNOSIS — N6042 Mammary duct ectasia of left breast: Secondary | ICD-10-CM | POA: Diagnosis not present

## 2015-08-26 DIAGNOSIS — N644 Mastodynia: Secondary | ICD-10-CM

## 2015-08-26 DIAGNOSIS — R234 Changes in skin texture: Secondary | ICD-10-CM

## 2015-08-26 DIAGNOSIS — N63 Unspecified lump in breast: Secondary | ICD-10-CM | POA: Diagnosis not present

## 2015-08-26 DIAGNOSIS — I10 Essential (primary) hypertension: Secondary | ICD-10-CM

## 2015-08-26 NOTE — Telephone Encounter (Signed)
Pt started taking amalodipine about 3 weeks, 3-4 days later had stomach problems, and has continued , wants to know if she can change med? pls call [phone] ok to leave message

## 2015-08-26 NOTE — Telephone Encounter (Signed)
Pt states since she started amlodipine she has had indigestion, belching and stomach discomfort.  Pt is asking if amlodipine can be changed to a different BP medication.  Pt confirmed she is taking valsartan 160mg  daily also.  Pt states BP since starting amlodipine has been 127/85 and 128/84. Pt advised I will forward to Dr Burt Knack for review.

## 2015-08-27 MED ORDER — HYDROCHLOROTHIAZIDE 25 MG PO TABS: 12.5000 mg | ORAL_TABLET | Freq: Every day | ORAL | Status: DC

## 2015-08-27 NOTE — Telephone Encounter (Signed)
I spoke with the pt and made her aware of medication change recommended by Dr Burt Knack.  New Rx sent to pharmacy and lab appointment scheduled on 09/10/15.

## 2015-08-27 NOTE — Telephone Encounter (Signed)
Would stop amlodipine. Start HCTZ 12.5 mg daily and check BMET in 2 weeks. thx

## 2015-08-29 ENCOUNTER — Other Ambulatory Visit (INDEPENDENT_AMBULATORY_CARE_PROVIDER_SITE_OTHER): Payer: PPO | Admitting: *Deleted

## 2015-08-29 ENCOUNTER — Encounter: Payer: Self-pay | Admitting: Internal Medicine

## 2015-08-29 ENCOUNTER — Ambulatory Visit (INDEPENDENT_AMBULATORY_CARE_PROVIDER_SITE_OTHER): Payer: PPO | Admitting: Internal Medicine

## 2015-08-29 VITALS — BP 114/70 | HR 71 | Temp 97.6°F | Resp 12 | Wt 196.8 lb

## 2015-08-29 DIAGNOSIS — E1140 Type 2 diabetes mellitus with diabetic neuropathy, unspecified: Secondary | ICD-10-CM

## 2015-08-29 DIAGNOSIS — E1165 Type 2 diabetes mellitus with hyperglycemia: Secondary | ICD-10-CM

## 2015-08-29 DIAGNOSIS — E1141 Type 2 diabetes mellitus with diabetic mononeuropathy: Secondary | ICD-10-CM | POA: Diagnosis not present

## 2015-08-29 DIAGNOSIS — Z794 Long term (current) use of insulin: Secondary | ICD-10-CM | POA: Diagnosis not present

## 2015-08-29 DIAGNOSIS — IMO0002 Reserved for concepts with insufficient information to code with codable children: Secondary | ICD-10-CM

## 2015-08-29 LAB — POCT GLYCOSYLATED HEMOGLOBIN (HGB A1C): HEMOGLOBIN A1C: 6.3

## 2015-08-29 NOTE — Progress Notes (Signed)
Patient ID: Cheryl Brown, female   DOB: 17-Jun-1936, 80 y.o.   MRN: [id number]  HPI: Cheryl Brown is a 80 y.o.-year-old female, returning for follow-up for DM2, dx in 08/2014, insulin-dependent, uncontrolled, with peripheral neuropathy. Last visit 3 months ago. She is here with her husband.  Last hemoglobin A1c was: Lab Results  Component Value Date   HGBA1C 9.2 05/28/2015   HGBA1C 9.5* 12/26/2014   HGBA1C 7.2* 08/17/2014  She had a steroid inj 10/2014 >> sugars much higher then.  Pt was on a regimen of: - Metformin 500 mg 1x a day, with b'fast >> ran out 1 week ago. Even this dose is giving her nausea. She tried Repaglinide 1 mg - has some at home. She believes he was given her nausea, but she was taking it along with metformin.   She is now on: - Lantus 20 units at bedtime - Glipizide XL 5 >> 2.5 mg in a.m. (decreased as she started to experience lows after last visit). We stopped metformin since she could not tolerate it because of GI symptoms.  Pt checks her sugars 1x a day and they are: - am: 202-388 >> 95-142 >> 93-121, 137 - 2h after b'fast: 380 >> 135-163 >> 63, 170 - before lunch: n/c >> 63, 83 >> 150 - 2h after lunch: n/c >> 87 - before dinner: n/c >> 113 >> 73-139 - 2h after dinner: n/c >> 164-204 >>127 - bedtime: n/c >> 110-181 >> 172 - nighttime: n/c >> 149, 179 >> n/c No lows. Lowest sugar was 202 >> 63. She may have feeling of shakiness when inChurch after b'fast >> cannot check sugars >> eats glu tablets then or crackers. Highest sugar was in the 400s >> now 172.  Glucometer: OneTouch 2  Pt's meals are: - Breakfast:cereals (cheerios) + 2% milk or eggs, bacon, yoghurt - Lunch: salad + chicken - may skip or snack  - Dinner: chicken/fish/stake - Snacks: Was drinking Boost for snacks, which I advised her to stop  - no CKD, last BUN/creatinine:  Lab Results  Component Value Date   BUN 14 12/26/2014   CREATININE 0.69 12/26/2014  On Valsartan. - last  set of lipids: Lab Results  Component Value Date   CHOL 196 05/28/2015   HDL 47.50 05/28/2015   LDLCALC 132* 11/16/2013   LDLDIRECT 122.0 05/28/2015   TRIG 293.0* 05/28/2015   CHOLHDL 4 05/28/2015   - last eye exam was in 2015. Dr Tommy [place]. No DR.  - + numbness and tingling in her feet. On Clonazepam.  She has OSA >> on CPAP. She also has HTN, COPD, RLS, GERD.  ROS: Constitutional: + weight gain, + fatigue, no hot flushes, no nocturia Eyes: no blurry vision, no xerophthalmia ENT: no sore throat, no nodules palpated in throat, no dysphagia/no odynophagia, no hoarseness Cardiovascular: no CP/+ SOB/no  palpitations/leg swelling Respiratory: no cough/+ SOB/no wheezing Gastrointestinal: + N/no V/D/C, + heartburn Musculoskeletal: no muscle aches/joint aches Skin: no rashes Neurological: + tremors/no numbness/tingling/dizziness  I reviewed pt's medications, allergies, PMH, social hx, family hx, and changes were documented in the history of present illness. Otherwise, unchanged from my initial visit note.  Past Medical History  Diagnosis Date  . Hyperlipemia   . Reflux esophagitis   . Palpitations   . Dysmetabolic syndrome   . Hypertension   . IBS (irritable bowel syndrome)   . Restless leg syndrome   . Neoplasm of pituitary gland ([place])     Dr.Nudelman   . Scoliosis  Spinal Stenosis  . COPD (chronic obstructive pulmonary disease) ([place])   . Pituitary adenoma ([place]) 1991    Dr.Love and Dr.Nudelman   . Shingles outbreak   . Diabetes mellitus   . Blood transfusion   . Recurrent UTI 2013-14    Dr. Thomasene Mohair and Dr. Marvel Plan   Past Surgical History  Procedure Laterality Date  . Partial hysterectomy      for dysfunctional bleeding   . Breast lumpectomy      4 lumps removed from breast, all benign   . Finger amputation      related to work injury   . Esophageal dilation  01/2003  . Ankle fracture surgery      left ankle  . Knee surgery  [id number]     complicated by  difficult resuscitation post anesthesia   . G2 p2    . Pituitary adenoma      Dr. Erling Cruz  . Laparoscopic cholecystectomy  03/2011    [place], [place]  . Cystoscopy  2014     Dr Thomasene Mohair  . Urethral dilation  2014  . Colonoscopy  2014    [facility], [facility]  . Upper gi endoscopy  2014    gastric polyp   Social History   Social History  . Marital Status: married    Spouse Name: N/A  . Number of Children: 2   Occupational History  . Retired    Social History Main Topics  . Smoking status: Former Smoker    Quit date: 08/10/1978  . Smokeless tobacco: Never Used  . Alcohol Use: No  . Drug Use: No   Current Outpatient Prescriptions on File Prior to Visit  Medication Sig Dispense Refill  . clonazePAM (KLONOPIN) 0.5 MG tablet TAKE 1/2 - 1 TABLETS BY MOUTH AT BEDTIME AS NEEDED ONLY--- Pt needs new PCP by Oct 1st 30 tablet 0  . Fluticasone-Salmeterol (ADVAIR DISKUS) 100-50 MCG/DOSE AEPB Inhale 1 puff into the lungs every 12 (twelve) hours. 60 each 5  . GLIPIZIDE XL 2.5 MG 24 hr tablet TAKE 1 TABLET (2.5 MG TOTAL) BY MOUTH DAILY WITH BREAKFAST. 90 tablet 0  . glucose blood (ONE TOUCH ULTRA TEST) test strip 1 each by Other route daily. Use to check blood sugar daily Dx: E11.41 90 each 3  . hydrochlorothiazide (HYDRODIURIL) 25 MG tablet Take 0.5 tablets (12.5 mg total) by mouth daily. 30 tablet 3  . Insulin Glargine (LANTUS SOLOSTAR) 100 UNIT/ML Solostar Pen Inject 16 Units into the skin at bedtime. (Patient taking differently: Inject 20 Units into the skin at bedtime. ) 5 pen 2  . Insulin Pen Needle (CLICKFINE PEN NEEDLES) 32G X 4 MM MISC Use 1x a day 100 each 11  . mupirocin ointment (BACTROBAN) 2 % Applied twice a day to the septum;NOT into eyes. 15 g 0  . Omeprazole (PRILOSEC PO) Take 20 mg by mouth daily.     [name] DELICA LANCETS MISC Check blood sugar as directed 100 each 3  . polyethylene glycol powder (MIRALAX) powder Take 17 g by mouth. 2 capfuls daily per Urologist    .  PREMARIN vaginal cream   11  . valsartan (DIOVAN) 160 MG tablet Take 1 tablet (160 mg total) by mouth daily. 30 tablet 6   No current facility-administered medications on file prior to visit.   Allergies  Allergen Reactions  . Morphine And Related Shortness Of Breath  . Codeine     nausea  . Ipratropium-Albuterol     REACTION: VERY  NERVOUS  . Niaspan [Niacin]     Abdominal pain  . Norvasc [Amlodipine Besylate]     10/15/14 headache 08/27/15 abdominal pain, indigestion  . Pramipexole Dihydrochloride     Rash & itching  . Pregabalin     REACTION: swelling  . Propoxyphene N-Acetaminophen     REACTION: UPSET STOMACH  . Simvastatin     REACTION: Leg cramps  . Sulfonamide Derivatives    Family History  Problem Relation Age of Onset  . Bone cancer Father   . Prostate cancer Father   . Diabetes Father   . Coronary artery disease Father   . Cancer Father     bone, prostate  . Alzheimer's disease Mother   . Arthritis Mother   . Mental illness Mother     alzheimers  . Alzheimer's disease Maternal Aunt   . Mental illness Maternal Aunt     alzheimers  . Mitral valve prolapse Sister   . Alzheimer's disease Sister   . Hypertension Sister   . Diabetes Sister   . Stroke Paternal Grandmother   . Osteoporosis Sister   . Mental illness Maternal Grandmother     alzheimers   PE: BP 114/70 mmHg  Pulse 71  Temp(Src) 97.6 F (36.4 C) (Oral)  Resp 12  Wt 196 lb 12.8 oz (89.268 kg)  SpO2 93% Body mass index is 34.87 kg/(m^2). Wt Readings from Last 3 Encounters:  08/29/15 196 lb 12.8 oz (89.268 kg)  07/26/15 198 lb 1.9 oz (89.867 kg)  05/28/15 186 lb 12.8 oz (84.732 kg)   Constitutional: overweight, in NAD Eyes: PERRLA, EOMI, no exophthalmos ENT: moist mucous membranes, no thyromegaly, no cervical lymphadenopathy Cardiovascular: RRR, No MRG Respiratory: CTA B Gastrointestinal: abdomen soft, NT, ND, BS+ Musculoskeletal: R 3,4,5 fingers missing (work accident), strength intact in  all 4 Skin: moist, warm, no rashes Neurological: no tremor with outstretched hands, DTR normal in all 4  ASSESSMENT: 1. DM2, insulin-dependent, uncontrolled, with complications -  Peripheral neuropathy  PLAN:  1. Patient with uncontrolled diabetes, now [facility] improved after adding basal insulin and starting glipizide extended-release in a.m. We had to decrease Glipizide due low CBGs. I  will continue current regimen, but may need to decrease Lantus or Glipizide if she continues to have lows. She has possible lows after b'fast >> advised her to stop eating a liquid meal in am (cereals with milk) >> I advised her to let me know if she still has lows after this. I also given her references about a healthy diet.. - I suggested to:  Patient Instructions  Please continue: - Lantus 20 units at bedtime - Glipizide XL 2.5 mg in am  Please let me know if the sugars are consistently <80 or >200.  Please come back for a follow-up appointment in 3 months.   - Continue checking sugars at different times of the day - check 2 times a day, rotating checks - will check HbA1c today >> 6.3% (MUCH better) - advised for yearly eye exams >> She needs one - Return to clinic in 3 mo with sugar log

## 2015-08-29 NOTE — Patient Instructions (Signed)
Please continue: - Lantus 20 units at bedtime - Glipizide XL 2.5 mg in am  Try to have only solid foods for breakfast.  If you have to delay a meal, please carry a snack with you.  Please let me know if the sugars are consistently <80 or >200.  Please come back for a follow-up appointment in 3 months.   Please consider the following ways to cut down carbs and fat and increase fiber and micronutrients in your diet: - substitute whole grain for white bread or pasta - substitute brown rice for white rice - substitute 90-calorie flatbread pieces for slices of bread when possible - substitute sweet potatoes or yams for white potatoes - substitute humus for margarine - substitute tofu for cheese when possible - substitute almond or rice milk for regular milk - substitute dark chocolate for other sweets when possible - substitute water - can add lemon/orange/lime/kiwi slices for taste - for diet sodas (artificial sweeteners will trick your body that you can eat sweets without getting calories and will lead you to overeating and weight gain in the long run) - do not skip breakfast or other meals (this will slow down the metabolism and will result in more weight gain over time)  - can try smoothies made from fruit and almond/rice milk in am instead of regular breakfast - can also try old-fashioned (not instant) oatmeal made with almond/rice milk in am - order the dressing on the side when eating salad at a restaurant (pour less than half of the dressing on the salad) - eat as little meat as possible  - can try juicing, but should not forget that juicing will get rid of the fiber, so would alternate with eating raw veg./fruits or drinking smoothies - use as little oil as possible, even when using olive oil - can dress a salad with a mix of balsamic vinegar and lemon juice, for e.g. - use agave nectar, stevia sugar, or regular sugar rather than artificial sweateners - steam or broil/roast veggies   - snack on veggies/fruit/nuts (unsalted, preferably) when possible, rather than processed foods - reduce or eliminate aspartame in diet (it is in diet sodas, chewing gum, etc) Read the labels!  Try to read Dr. Janene Harvey book: "Program for Reversing Diabetes" for the vegan concept and other ideas for healthy eating.

## 2015-09-10 ENCOUNTER — Other Ambulatory Visit (INDEPENDENT_AMBULATORY_CARE_PROVIDER_SITE_OTHER): Payer: PPO

## 2015-09-10 DIAGNOSIS — I10 Essential (primary) hypertension: Secondary | ICD-10-CM | POA: Diagnosis not present

## 2015-09-10 LAB — BASIC METABOLIC PANEL
BUN: 15 mg/dL (ref 7–25)
CALCIUM: 8.9 mg/dL (ref 8.6–10.4)
CO2: 23 mmol/L (ref 20–31)
CREATININE: 0.75 mg/dL (ref 0.60–0.93)
Chloride: 107 mmol/L (ref 98–110)
GLUCOSE: 118 mg/dL — AB (ref 65–99)
Potassium: 4.7 mmol/L (ref 3.5–5.3)
Sodium: 139 mmol/L (ref 135–146)

## 2015-09-16 DIAGNOSIS — I10 Essential (primary) hypertension: Secondary | ICD-10-CM | POA: Diagnosis not present

## 2015-09-16 DIAGNOSIS — Z6835 Body mass index (BMI) 35.0-35.9, adult: Secondary | ICD-10-CM | POA: Diagnosis not present

## 2015-09-16 DIAGNOSIS — M722 Plantar fascial fibromatosis: Secondary | ICD-10-CM | POA: Diagnosis not present

## 2015-09-16 DIAGNOSIS — E1165 Type 2 diabetes mellitus with hyperglycemia: Secondary | ICD-10-CM | POA: Diagnosis not present

## 2015-09-16 DIAGNOSIS — E119 Type 2 diabetes mellitus without complications: Secondary | ICD-10-CM | POA: Diagnosis not present

## 2015-09-23 ENCOUNTER — Other Ambulatory Visit: Payer: Self-pay | Admitting: Cardiovascular Disease

## 2015-09-23 MED ORDER — VALSARTAN 160 MG PO TABS: 160.0000 mg | ORAL_TABLET | Freq: Every day | ORAL | Status: DC

## 2015-10-25 DIAGNOSIS — E119 Type 2 diabetes mellitus without complications: Secondary | ICD-10-CM | POA: Diagnosis not present

## 2015-10-25 DIAGNOSIS — R8290 Unspecified abnormal findings in urine: Secondary | ICD-10-CM | POA: Diagnosis not present

## 2015-10-25 DIAGNOSIS — Z6834 Body mass index (BMI) 34.0-34.9, adult: Secondary | ICD-10-CM | POA: Diagnosis not present

## 2015-11-28 ENCOUNTER — Ambulatory Visit: Payer: PPO | Admitting: Internal Medicine

## 2015-12-04 DIAGNOSIS — E119 Type 2 diabetes mellitus without complications: Secondary | ICD-10-CM | POA: Diagnosis not present

## 2015-12-04 DIAGNOSIS — Z6834 Body mass index (BMI) 34.0-34.9, adult: Secondary | ICD-10-CM | POA: Diagnosis not present

## 2015-12-04 DIAGNOSIS — I10 Essential (primary) hypertension: Secondary | ICD-10-CM | POA: Diagnosis not present

## 2015-12-04 DIAGNOSIS — E1140 Type 2 diabetes mellitus with diabetic neuropathy, unspecified: Secondary | ICD-10-CM | POA: Diagnosis not present

## 2015-12-04 DIAGNOSIS — R609 Edema, unspecified: Secondary | ICD-10-CM | POA: Diagnosis not present

## 2015-12-09 ENCOUNTER — Telehealth: Payer: Self-pay | Admitting: *Deleted

## 2015-12-09 NOTE — Telephone Encounter (Signed)
Patient calling about  Hydrochlorothiazide 12.5 . Her primary doctor changed her medcine to 25 mg and she is still having swelling. Please advise to stop or not.

## 2015-12-09 NOTE — Telephone Encounter (Signed)
I attempted to reach the pt on 3 separate occasions but it states "call cannot be completed as dialed".  This is the same number found in her emergency contact information. Will await a return phone call.

## 2015-12-10 NOTE — Telephone Encounter (Signed)
I attempted to call the pt again and the message says call cannot be completed as dialed.

## 2015-12-24 DIAGNOSIS — Z961 Presence of intraocular lens: Secondary | ICD-10-CM | POA: Diagnosis not present

## 2015-12-24 DIAGNOSIS — H35372 Puckering of macula, left eye: Secondary | ICD-10-CM | POA: Diagnosis not present

## 2015-12-24 DIAGNOSIS — H02839 Dermatochalasis of unspecified eye, unspecified eyelid: Secondary | ICD-10-CM | POA: Diagnosis not present

## 2015-12-24 DIAGNOSIS — H18411 Arcus senilis, right eye: Secondary | ICD-10-CM | POA: Diagnosis not present

## 2015-12-24 DIAGNOSIS — H18412 Arcus senilis, left eye: Secondary | ICD-10-CM | POA: Diagnosis not present

## 2015-12-24 DIAGNOSIS — H43822 Vitreomacular adhesion, left eye: Secondary | ICD-10-CM | POA: Diagnosis not present

## 2015-12-27 DIAGNOSIS — M549 Dorsalgia, unspecified: Secondary | ICD-10-CM | POA: Diagnosis not present

## 2015-12-27 DIAGNOSIS — M5136 Other intervertebral disc degeneration, lumbar region: Secondary | ICD-10-CM | POA: Diagnosis not present

## 2015-12-27 DIAGNOSIS — M4806 Spinal stenosis, lumbar region: Secondary | ICD-10-CM | POA: Diagnosis not present

## 2015-12-27 DIAGNOSIS — M546 Pain in thoracic spine: Secondary | ICD-10-CM | POA: Diagnosis not present

## 2015-12-27 DIAGNOSIS — M47816 Spondylosis without myelopathy or radiculopathy, lumbar region: Secondary | ICD-10-CM | POA: Diagnosis not present

## 2015-12-27 DIAGNOSIS — M4316 Spondylolisthesis, lumbar region: Secondary | ICD-10-CM | POA: Diagnosis not present

## 2016-01-03 DIAGNOSIS — M4806 Spinal stenosis, lumbar region: Secondary | ICD-10-CM | POA: Diagnosis not present

## 2016-01-09 DIAGNOSIS — M5136 Other intervertebral disc degeneration, lumbar region: Secondary | ICD-10-CM | POA: Diagnosis not present

## 2016-01-09 DIAGNOSIS — M47816 Spondylosis without myelopathy or radiculopathy, lumbar region: Secondary | ICD-10-CM | POA: Diagnosis not present

## 2016-01-09 DIAGNOSIS — M4316 Spondylolisthesis, lumbar region: Secondary | ICD-10-CM | POA: Diagnosis not present

## 2016-01-09 DIAGNOSIS — Z6834 Body mass index (BMI) 34.0-34.9, adult: Secondary | ICD-10-CM | POA: Diagnosis not present

## 2016-01-20 ENCOUNTER — Encounter: Payer: Self-pay | Admitting: Cardiovascular Disease

## 2016-01-27 ENCOUNTER — Ambulatory Visit (INDEPENDENT_AMBULATORY_CARE_PROVIDER_SITE_OTHER): Payer: PPO | Admitting: Cardiovascular Disease

## 2016-01-27 ENCOUNTER — Encounter: Payer: Self-pay | Admitting: Cardiovascular Disease

## 2016-01-27 VITALS — BP 130/80 | HR 72 | Ht 63.0 in | Wt 191.2 lb

## 2016-01-27 DIAGNOSIS — I10 Essential (primary) hypertension: Secondary | ICD-10-CM

## 2016-01-27 MED ORDER — FUROSEMIDE 20 MG PO TABS: 20.0000 mg | ORAL_TABLET | Freq: Every day | ORAL | Status: AC

## 2016-01-27 NOTE — Patient Instructions (Signed)
**Note De-Identified Cheryl Brown Obfuscation** Medication Instructions:  Start taking Lasix 20 mg daily-all other medications remain the same.  Labwork: February 10, 2016- BMET  Testing/Procedures: None  Follow-Up: Your physician wants you to follow-up in: 6 months. You will receive a reminder letter in the mail two months in advance. If you don't receive a letter, please call our office to schedule the follow-up appointment.     If you need a refill on your cardiac medications before your next appointment, please call your pharmacy.

## 2016-01-27 NOTE — Progress Notes (Signed)
Cardiology Office Note Date:  01/27/2016   ID:  SHAYNEE GINGERY, DOB 09/08/1935, MRN [id number]  PCP:  Velna Hatchet, MD  Cardiologist:  Sherren Mocha, MD    Chief Complaint  Patient presents with  . Hypertension    C/O sob (copd) and LEE and claudication..Denies CP,     History of Present Illness: Cheryl Brown is a 80 y.o. female who presents for Follow-up of hypertension and hyperlipidemia. The patient also has type 2 diabetes. She's been primarily limited by problems related to her back and legs.  The patient has exertional dyspnea. An echocardiogram after her initial visit here was essentially within normal limits. I felt deconditioning was a primary issue. She has leg swelling and has had difficulty tolerating various antihypertensive medicines. She has discontinued amlodipine and hydrochlorothiazide because of side effects. No chest pain or pressure. No heart palpitations, lightheadedness, or syncope. She's had recent problems with nausea and diaphoresis.   Past Medical History  Diagnosis Date  . Hyperlipemia   . Reflux esophagitis   . Palpitations   . Dysmetabolic syndrome   . Hypertension   . IBS (irritable bowel syndrome)   . Restless leg syndrome   . Neoplasm of pituitary gland ([place])     Dr.Nudelman   . Scoliosis     Spinal Stenosis  . COPD (chronic obstructive pulmonary disease) ([place])   . Pituitary adenoma ([place]) 1991    Dr.Love and Dr.Nudelman   . Shingles outbreak   . Diabetes mellitus   . Blood transfusion   . Recurrent UTI 2013-14    Dr. Thomasene Mohair and Dr. Marvel Plan    Past Surgical History  Procedure Laterality Date  . Partial hysterectomy      for dysfunctional bleeding   . Breast lumpectomy      4 lumps removed from breast, all benign   . Finger amputation      related to work injury   . Esophageal dilation  01/2003  . Ankle fracture surgery      left ankle  . Knee surgery  [id number]     complicated by difficult resuscitation post  anesthesia   . G2 p2    . Pituitary adenoma      Dr. Erling Cruz  . Laparoscopic cholecystectomy  03/2011    [place], [place]  . Cystoscopy  2014     Dr Thomasene Mohair  . Urethral dilation  2014  . Colonoscopy  2014    Tics, [facility]  . Upper gi endoscopy  2014    gastric polyp    Current Outpatient Prescriptions  Medication Sig Dispense Refill  . clonazePAM (KLONOPIN) 0.5 MG tablet Take 0.25-0.5 mg by mouth at bedtime as needed (sleep).    . Fluticasone-Salmeterol (ADVAIR DISKUS) 100-50 MCG/DOSE AEPB Inhale 1 puff into the lungs every 12 (twelve) hours. 60 each 5  . glucose blood (ONE TOUCH ULTRA TEST) test strip 1 each by Other route daily. Use to check blood sugar daily Dx: E11.41 90 each 3  . hydrochlorothiazide (HYDRODIURIL) 25 MG tablet Take 0.5 tablets (12.5 mg total) by mouth daily. 30 tablet 3  . Insulin Pen Needle (CLICKFINE PEN NEEDLES) 32G X 4 MM MISC Use 1x a day 100 each 11  . metFORMIN (GLUCOPHAGE) 500 MG tablet Take 500 mg by mouth 2 (two) times daily with a meal.    . mupirocin ointment (BACTROBAN) 2 % Applied twice a day to the septum;NOT into eyes. 15 g 0  . Omeprazole (PRILOSEC PO) Take 20  mg by mouth daily.     [name] DELICA LANCETS MISC Check blood sugar as directed 100 each 3  . polyethylene glycol powder (MIRALAX) powder Take 1 Container by mouth 2 (two) times daily. 2 capfuls daily per Urologist    . valsartan (DIOVAN) 160 MG tablet Take 1 tablet (160 mg total) by mouth daily. 90 tablet 3  . furosemide (LASIX) 20 MG tablet Take 1 tablet (20 mg total) by mouth daily. 30 tablet 6   No current facility-administered medications for this visit.    Allergies:   Morphine and related; Codeine; Ipratropium-albuterol; Niaspan; Norvasc; Pramipexole dihydrochloride; Pregabalin; Propoxyphene n-acetaminophen; Simvastatin; and Sulfonamide derivatives   Social History:  The patient  reports that she quit smoking about 37 years ago. She has never used smokeless tobacco. She  reports that she does not drink alcohol or use illicit drugs.   Family History:  The patient's  family history includes Alzheimer's disease in her maternal aunt, mother, and sister; Arthritis in her mother; Bone cancer in her father; Cancer in her father; Coronary artery disease in her father; Diabetes in her father and sister; Hypertension in her sister; Mental illness in her maternal aunt, maternal grandmother, and mother; Mitral valve prolapse in her sister; Osteoporosis in her sister; Prostate cancer in her father; Stroke in her paternal grandmother.    ROS:  Please see the history of present illness.  Otherwise, review of systems is positive for Exertional dyspnea, leg swelling, abdominal pain, diarrhea, nausea, snoring, constipation, fatigue.  All other systems are reviewed and negative.    PHYSICAL EXAM: VS:  BP 130/80 mmHg  Pulse 72  Ht 5\' 3"  (1.6 m)  Wt 191 lb 3.2 oz (86.728 kg)  BMI 33.88 kg/m2 , BMI Body mass index is 33.88 kg/(m^2). GEN: Well nourished, well developed, in no acute distress HEENT: normal Neck: no JVD, no masses. No carotid bruits Cardiac: RRR without murmur or gallop                Respiratory:  clear to auscultation bilaterally, normal work of breathing GI: soft, nontender, nondistended, + BS MS: no deformity or atrophy Ext: 1+ ankle edema, pedal pulses 2+= bilaterally, varicosities bilaterally Skin: warm and dry, no rash Neuro:  Strength and sensation are intact Psych: euthymic mood, full affect  EKG:  EKG is ordered today. The ekg ordered today shows normal sinus rhythm 72 bpm, incomplete right bundle branch block, possible age-indeterminate inferior infarct  Recent Labs: 09/10/2015: BUN 15; Creat 0.75; Potassium 4.7; Sodium 139   Lipid Panel     Component Value Date/Time   CHOL 196 05/28/2015 1416   TRIG 293.0* 05/28/2015 1416   HDL 47.50 05/28/2015 1416   CHOLHDL 4 05/28/2015 1416   VLDL 58.6* 05/28/2015 1416   LDLCALC 132* 11/16/2013 1418    LDLDIRECT 122.0 05/28/2015 1416      Wt Readings from Last 3 Encounters:  01/27/16 191 lb 3.2 oz (86.728 kg)  08/29/15 196 lb 12.8 oz (89.268 kg)  07/26/15 198 lb 1.9 oz (89.867 kg)     Cardiac Studies Reviewed: 2D Echo 08-13-2015: Study Conclusions  - Left ventricle: The cavity size was normal. Wall thickness was  increased in a pattern of mild LVH. Systolic function was normal.  The estimated ejection fraction was in the range of 55% to 60%.  Wall motion was normal; there were no regional wall motion  abnormalities. Doppler parameters are consistent with abnormal  left ventricular relaxation (grade 1 diastolic dysfunction). - Mitral  valve: There was mild regurgitation. - Left atrium: The atrium was mildly dilated. - Atrial septum: No defect or patent foramen ovale was identified.  ASSESSMENT AND PLAN: 1.  Essential hypertension: Blood pressure is better controlled. She has quit taking HCTZ because of side effects. Her medications are reviewed. She continues to have leg swelling. Will try furosemide 20 mg daily. Repeat a metabolic panel in a few weeks. She should continue valsartan.  2. Type 2 diabetes: Followed by Dr. Renne Crigler. Recent office notes reviewed.  3. Leg swelling: Suspect venous insufficiency at least partly responsible for this. Discussed lifestyle modification measures.  Current medicines are reviewed with the patient today.  The patient does not have concerns regarding medicines.  Labs/ tests ordered today include:   Orders Placed This Encounter  Procedures  . Basic Metabolic Panel (BMET)  . EKG 12-Lead    Disposition:   FU 6 months  Signed, Sherren Mocha, MD  01/27/2016 4:53 PM    [facility] [address], [place], [place]  [address] Phone: [phone]; Fax: [phone]

## 2016-02-04 DIAGNOSIS — E119 Type 2 diabetes mellitus without complications: Secondary | ICD-10-CM | POA: Diagnosis not present

## 2016-02-04 DIAGNOSIS — R600 Localized edema: Secondary | ICD-10-CM | POA: Diagnosis not present

## 2016-02-04 DIAGNOSIS — I10 Essential (primary) hypertension: Secondary | ICD-10-CM | POA: Diagnosis not present

## 2016-02-04 DIAGNOSIS — E1140 Type 2 diabetes mellitus with diabetic neuropathy, unspecified: Secondary | ICD-10-CM | POA: Diagnosis not present

## 2016-02-04 DIAGNOSIS — J449 Chronic obstructive pulmonary disease, unspecified: Secondary | ICD-10-CM | POA: Diagnosis not present

## 2016-02-04 DIAGNOSIS — K219 Gastro-esophageal reflux disease without esophagitis: Secondary | ICD-10-CM | POA: Diagnosis not present

## 2016-02-10 ENCOUNTER — Other Ambulatory Visit (INDEPENDENT_AMBULATORY_CARE_PROVIDER_SITE_OTHER): Payer: PPO | Admitting: *Deleted

## 2016-02-10 DIAGNOSIS — I10 Essential (primary) hypertension: Secondary | ICD-10-CM | POA: Diagnosis not present

## 2016-02-10 LAB — BASIC METABOLIC PANEL
BUN: 16 mg/dL (ref 7–25)
CHLORIDE: 105 mmol/L (ref 98–110)
CO2: 26 mmol/L (ref 20–31)
Calcium: 8.8 mg/dL (ref 8.6–10.4)
Creat: 0.68 mg/dL (ref 0.60–0.93)
Glucose, Bld: 180 mg/dL — ABNORMAL HIGH (ref 65–99)
POTASSIUM: 4 mmol/L (ref 3.5–5.3)
SODIUM: 141 mmol/L (ref 135–146)

## 2016-02-24 DIAGNOSIS — H35371 Puckering of macula, right eye: Secondary | ICD-10-CM | POA: Diagnosis not present

## 2016-02-24 DIAGNOSIS — H43811 Vitreous degeneration, right eye: Secondary | ICD-10-CM | POA: Diagnosis not present

## 2016-02-24 DIAGNOSIS — H35342 Macular cyst, hole, or pseudohole, left eye: Secondary | ICD-10-CM | POA: Diagnosis not present

## 2016-02-24 DIAGNOSIS — H43822 Vitreomacular adhesion, left eye: Secondary | ICD-10-CM | POA: Diagnosis not present

## 2016-02-26 DIAGNOSIS — E119 Type 2 diabetes mellitus without complications: Secondary | ICD-10-CM | POA: Diagnosis not present

## 2016-02-26 DIAGNOSIS — I10 Essential (primary) hypertension: Secondary | ICD-10-CM | POA: Diagnosis not present

## 2016-02-26 DIAGNOSIS — Z7984 Long term (current) use of oral hypoglycemic drugs: Secondary | ICD-10-CM | POA: Diagnosis not present

## 2016-02-27 DIAGNOSIS — Z7984 Long term (current) use of oral hypoglycemic drugs: Secondary | ICD-10-CM | POA: Diagnosis not present

## 2016-02-27 DIAGNOSIS — R600 Localized edema: Secondary | ICD-10-CM | POA: Diagnosis not present

## 2016-02-27 DIAGNOSIS — R112 Nausea with vomiting, unspecified: Secondary | ICD-10-CM | POA: Diagnosis not present

## 2016-02-27 DIAGNOSIS — I10 Essential (primary) hypertension: Secondary | ICD-10-CM | POA: Diagnosis not present

## 2016-02-27 DIAGNOSIS — E1140 Type 2 diabetes mellitus with diabetic neuropathy, unspecified: Secondary | ICD-10-CM | POA: Diagnosis not present

## 2016-02-27 DIAGNOSIS — J449 Chronic obstructive pulmonary disease, unspecified: Secondary | ICD-10-CM | POA: Diagnosis not present

## 2016-02-27 DIAGNOSIS — E119 Type 2 diabetes mellitus without complications: Secondary | ICD-10-CM | POA: Diagnosis not present

## 2016-02-27 DIAGNOSIS — K219 Gastro-esophageal reflux disease without esophagitis: Secondary | ICD-10-CM | POA: Diagnosis not present

## 2016-03-10 DIAGNOSIS — Z7984 Long term (current) use of oral hypoglycemic drugs: Secondary | ICD-10-CM | POA: Diagnosis not present

## 2016-03-10 DIAGNOSIS — E119 Type 2 diabetes mellitus without complications: Secondary | ICD-10-CM | POA: Diagnosis not present

## 2016-04-15 DIAGNOSIS — D225 Melanocytic nevi of trunk: Secondary | ICD-10-CM | POA: Diagnosis not present

## 2016-04-15 DIAGNOSIS — L821 Other seborrheic keratosis: Secondary | ICD-10-CM | POA: Diagnosis not present

## 2016-04-15 DIAGNOSIS — L820 Inflamed seborrheic keratosis: Secondary | ICD-10-CM | POA: Diagnosis not present

## 2016-04-21 DIAGNOSIS — R600 Localized edema: Secondary | ICD-10-CM | POA: Diagnosis not present

## 2016-04-21 DIAGNOSIS — Z7984 Long term (current) use of oral hypoglycemic drugs: Secondary | ICD-10-CM | POA: Diagnosis not present

## 2016-04-21 DIAGNOSIS — E1140 Type 2 diabetes mellitus with diabetic neuropathy, unspecified: Secondary | ICD-10-CM | POA: Diagnosis not present

## 2016-04-21 DIAGNOSIS — J449 Chronic obstructive pulmonary disease, unspecified: Secondary | ICD-10-CM | POA: Diagnosis not present

## 2016-04-21 DIAGNOSIS — I10 Essential (primary) hypertension: Secondary | ICD-10-CM | POA: Diagnosis not present

## 2016-04-21 DIAGNOSIS — K219 Gastro-esophageal reflux disease without esophagitis: Secondary | ICD-10-CM | POA: Diagnosis not present

## 2016-04-21 DIAGNOSIS — E119 Type 2 diabetes mellitus without complications: Secondary | ICD-10-CM | POA: Diagnosis not present

## 2016-05-14 DIAGNOSIS — Z23 Encounter for immunization: Secondary | ICD-10-CM | POA: Diagnosis not present

## 2016-06-05 DIAGNOSIS — I10 Essential (primary) hypertension: Secondary | ICD-10-CM | POA: Diagnosis not present

## 2016-06-05 DIAGNOSIS — E1140 Type 2 diabetes mellitus with diabetic neuropathy, unspecified: Secondary | ICD-10-CM | POA: Diagnosis not present

## 2016-06-05 DIAGNOSIS — Z794 Long term (current) use of insulin: Secondary | ICD-10-CM | POA: Diagnosis not present

## 2016-06-05 DIAGNOSIS — E119 Type 2 diabetes mellitus without complications: Secondary | ICD-10-CM | POA: Diagnosis not present

## 2016-06-24 DIAGNOSIS — J449 Chronic obstructive pulmonary disease, unspecified: Secondary | ICD-10-CM | POA: Diagnosis not present

## 2016-06-24 DIAGNOSIS — E1140 Type 2 diabetes mellitus with diabetic neuropathy, unspecified: Secondary | ICD-10-CM | POA: Diagnosis not present

## 2016-06-24 DIAGNOSIS — E1165 Type 2 diabetes mellitus with hyperglycemia: Secondary | ICD-10-CM | POA: Diagnosis not present

## 2016-06-24 DIAGNOSIS — Z794 Long term (current) use of insulin: Secondary | ICD-10-CM | POA: Diagnosis not present

## 2016-06-24 DIAGNOSIS — I10 Essential (primary) hypertension: Secondary | ICD-10-CM | POA: Diagnosis not present

## 2016-06-24 DIAGNOSIS — E119 Type 2 diabetes mellitus without complications: Secondary | ICD-10-CM | POA: Diagnosis not present

## 2016-07-20 DIAGNOSIS — I10 Essential (primary) hypertension: Secondary | ICD-10-CM | POA: Diagnosis not present

## 2016-07-20 DIAGNOSIS — E119 Type 2 diabetes mellitus without complications: Secondary | ICD-10-CM | POA: Diagnosis not present

## 2016-07-20 DIAGNOSIS — E1140 Type 2 diabetes mellitus with diabetic neuropathy, unspecified: Secondary | ICD-10-CM | POA: Diagnosis not present

## 2016-07-20 DIAGNOSIS — Z794 Long term (current) use of insulin: Secondary | ICD-10-CM | POA: Diagnosis not present

## 2016-07-20 DIAGNOSIS — J449 Chronic obstructive pulmonary disease, unspecified: Secondary | ICD-10-CM | POA: Diagnosis not present

## 2016-07-20 DIAGNOSIS — L299 Pruritus, unspecified: Secondary | ICD-10-CM | POA: Diagnosis not present

## 2016-07-22 DIAGNOSIS — H35342 Macular cyst, hole, or pseudohole, left eye: Secondary | ICD-10-CM | POA: Diagnosis not present

## 2016-07-22 DIAGNOSIS — H4322 Crystalline deposits in vitreous body, left eye: Secondary | ICD-10-CM | POA: Diagnosis not present

## 2016-07-22 DIAGNOSIS — H43822 Vitreomacular adhesion, left eye: Secondary | ICD-10-CM | POA: Diagnosis not present

## 2016-07-22 DIAGNOSIS — H31012 Macula scars of posterior pole (postinflammatory) (post-traumatic), left eye: Secondary | ICD-10-CM | POA: Diagnosis not present

## 2016-07-24 ENCOUNTER — Other Ambulatory Visit: Payer: Self-pay | Admitting: Obstetrics and Gynecology

## 2016-07-24 DIAGNOSIS — Z1231 Encounter for screening mammogram for malignant neoplasm of breast: Secondary | ICD-10-CM

## 2016-07-27 ENCOUNTER — Other Ambulatory Visit: Payer: Self-pay | Admitting: Internal Medicine

## 2016-07-30 DIAGNOSIS — J069 Acute upper respiratory infection, unspecified: Secondary | ICD-10-CM | POA: Diagnosis not present

## 2016-08-25 DIAGNOSIS — H02839 Dermatochalasis of unspecified eye, unspecified eyelid: Secondary | ICD-10-CM | POA: Diagnosis not present

## 2016-08-25 DIAGNOSIS — Z961 Presence of intraocular lens: Secondary | ICD-10-CM | POA: Diagnosis not present

## 2016-08-25 DIAGNOSIS — H43822 Vitreomacular adhesion, left eye: Secondary | ICD-10-CM | POA: Diagnosis not present

## 2016-08-25 DIAGNOSIS — I10 Essential (primary) hypertension: Secondary | ICD-10-CM | POA: Diagnosis not present

## 2016-08-25 DIAGNOSIS — H26491 Other secondary cataract, right eye: Secondary | ICD-10-CM | POA: Diagnosis not present

## 2016-08-25 DIAGNOSIS — H35372 Puckering of macula, left eye: Secondary | ICD-10-CM | POA: Diagnosis not present

## 2016-08-28 ENCOUNTER — Encounter (HOSPITAL_COMMUNITY): Payer: Self-pay | Admitting: Emergency Medicine

## 2016-08-28 ENCOUNTER — Emergency Department (HOSPITAL_COMMUNITY): Payer: PPO

## 2016-08-28 ENCOUNTER — Inpatient Hospital Stay (HOSPITAL_COMMUNITY)
Admission: EM | Admit: 2016-08-28 | Discharge: 2016-08-31 | DRG: 392 | Disposition: A | Discharge status: Home / Self Care | Payer: PPO | Attending: Internal Medicine | Admitting: Internal Medicine

## 2016-08-28 DIAGNOSIS — I10 Essential (primary) hypertension: Secondary | ICD-10-CM | POA: Diagnosis present

## 2016-08-28 DIAGNOSIS — I119 Hypertensive heart disease without heart failure: Secondary | ICD-10-CM | POA: Diagnosis not present

## 2016-08-28 DIAGNOSIS — Z8261 Family history of arthritis: Secondary | ICD-10-CM | POA: Diagnosis not present

## 2016-08-28 DIAGNOSIS — E1149 Type 2 diabetes mellitus with other diabetic neurological complication: Secondary | ICD-10-CM | POA: Diagnosis present

## 2016-08-28 DIAGNOSIS — K529 Noninfective gastroenteritis and colitis, unspecified: Secondary | ICD-10-CM | POA: Diagnosis present

## 2016-08-28 DIAGNOSIS — Z89029 Acquired absence of unspecified finger(s): Secondary | ICD-10-CM

## 2016-08-28 DIAGNOSIS — E1165 Type 2 diabetes mellitus with hyperglycemia: Secondary | ICD-10-CM | POA: Diagnosis not present

## 2016-08-28 DIAGNOSIS — R1110 Vomiting, unspecified: Secondary | ICD-10-CM | POA: Diagnosis not present

## 2016-08-28 DIAGNOSIS — Z86010 Personal history of colonic polyps: Secondary | ICD-10-CM

## 2016-08-28 DIAGNOSIS — Z7951 Long term (current) use of inhaled steroids: Secondary | ICD-10-CM

## 2016-08-28 DIAGNOSIS — Z0189 Encounter for other specified special examinations: Secondary | ICD-10-CM

## 2016-08-28 DIAGNOSIS — Z886 Allergy status to analgesic agent status: Secondary | ICD-10-CM | POA: Diagnosis not present

## 2016-08-28 DIAGNOSIS — G2581 Restless legs syndrome: Secondary | ICD-10-CM | POA: Diagnosis present

## 2016-08-28 DIAGNOSIS — J449 Chronic obstructive pulmonary disease, unspecified: Secondary | ICD-10-CM | POA: Diagnosis present

## 2016-08-28 DIAGNOSIS — Z87891 Personal history of nicotine dependence: Secondary | ICD-10-CM

## 2016-08-28 DIAGNOSIS — E1140 Type 2 diabetes mellitus with diabetic neuropathy, unspecified: Secondary | ICD-10-CM | POA: Diagnosis not present

## 2016-08-28 DIAGNOSIS — Z885 Allergy status to narcotic agent status: Secondary | ICD-10-CM

## 2016-08-28 DIAGNOSIS — Z79899 Other long term (current) drug therapy: Secondary | ICD-10-CM

## 2016-08-28 DIAGNOSIS — Z9889 Other specified postprocedural states: Secondary | ICD-10-CM | POA: Diagnosis not present

## 2016-08-28 DIAGNOSIS — Z820 Family history of epilepsy and other diseases of the nervous system: Secondary | ICD-10-CM | POA: Diagnosis not present

## 2016-08-28 DIAGNOSIS — Z8262 Family history of osteoporosis: Secondary | ICD-10-CM | POA: Diagnosis not present

## 2016-08-28 DIAGNOSIS — R109 Unspecified abdominal pain: Secondary | ICD-10-CM | POA: Diagnosis not present

## 2016-08-28 DIAGNOSIS — A084 Viral intestinal infection, unspecified: Secondary | ICD-10-CM | POA: Diagnosis not present

## 2016-08-28 DIAGNOSIS — Z8249 Family history of ischemic heart disease and other diseases of the circulatory system: Secondary | ICD-10-CM | POA: Diagnosis not present

## 2016-08-28 DIAGNOSIS — Z888 Allergy status to other drugs, medicaments and biological substances status: Secondary | ICD-10-CM

## 2016-08-28 DIAGNOSIS — R1010 Upper abdominal pain, unspecified: Secondary | ICD-10-CM | POA: Diagnosis not present

## 2016-08-28 DIAGNOSIS — Z90710 Acquired absence of both cervix and uterus: Secondary | ICD-10-CM

## 2016-08-28 DIAGNOSIS — R079 Chest pain, unspecified: Secondary | ICD-10-CM | POA: Diagnosis not present

## 2016-08-28 DIAGNOSIS — Z823 Family history of stroke: Secondary | ICD-10-CM | POA: Diagnosis not present

## 2016-08-28 DIAGNOSIS — Z7984 Long term (current) use of oral hypoglycemic drugs: Secondary | ICD-10-CM | POA: Diagnosis not present

## 2016-08-28 DIAGNOSIS — IMO0002 Reserved for concepts with insufficient information to code with codable children: Secondary | ICD-10-CM | POA: Diagnosis present

## 2016-08-28 DIAGNOSIS — K56609 Unspecified intestinal obstruction, unspecified as to partial versus complete obstruction: Secondary | ICD-10-CM

## 2016-08-28 DIAGNOSIS — G4733 Obstructive sleep apnea (adult) (pediatric): Secondary | ICD-10-CM | POA: Diagnosis present

## 2016-08-28 DIAGNOSIS — Z833 Family history of diabetes mellitus: Secondary | ICD-10-CM

## 2016-08-28 DIAGNOSIS — Z808 Family history of malignant neoplasm of other organs or systems: Secondary | ICD-10-CM

## 2016-08-28 DIAGNOSIS — K219 Gastro-esophageal reflux disease without esophagitis: Secondary | ICD-10-CM | POA: Diagnosis present

## 2016-08-28 DIAGNOSIS — Z818 Family history of other mental and behavioral disorders: Secondary | ICD-10-CM

## 2016-08-28 DIAGNOSIS — K56600 Partial intestinal obstruction, unspecified as to cause: Secondary | ICD-10-CM | POA: Diagnosis present

## 2016-08-28 DIAGNOSIS — Z87440 Personal history of urinary (tract) infections: Secondary | ICD-10-CM

## 2016-08-28 LAB — I-STAT TROPONIN, ED: TROPONIN I, POC: 0 ng/mL (ref 0.00–0.08)

## 2016-08-28 LAB — BASIC METABOLIC PANEL
Anion gap: 12 (ref 5–15)
BUN: 16 mg/dL (ref 6–20)
CO2: 26 mmol/L (ref 22–32)
Calcium: 9.6 mg/dL (ref 8.9–10.3)
Chloride: 103 mmol/L (ref 101–111)
Creatinine, Ser: 0.67 mg/dL (ref 0.44–1.00)
GFR calc Af Amer: >60 mL/min (ref >60)
Glucose, Bld: 129 mg/dL — ABNORMAL HIGH (ref 65–99)
POTASSIUM: 4.2 mmol/L (ref 3.5–5.1)
SODIUM: 141 mmol/L (ref 135–145)

## 2016-08-28 LAB — CBC
HEMATOCRIT: 43.9 % (ref 36.0–46.0)
Hemoglobin: 14.5 g/dL (ref 12.0–15.0)
MCH: 30.6 pg (ref 26.0–34.0)
MCHC: 33 g/dL (ref 30.0–36.0)
MCV: 92.6 fL (ref 78.0–100.0)
Platelets: 213 10*3/uL (ref 150–400)
RBC: 4.74 MIL/uL (ref 3.87–5.11)
RDW: 13 % (ref 11.5–15.5)
WBC: 5.2 10*3/uL (ref 4.0–10.5)

## 2016-08-28 LAB — LIPASE, BLOOD: Lipase: 21 U/L (ref 11–51)

## 2016-08-28 LAB — URINALYSIS, ROUTINE W REFLEX MICROSCOPIC
BILIRUBIN URINE: NEGATIVE
Glucose, UA: NEGATIVE mg/dL
Hgb urine dipstick: NEGATIVE
Ketones, ur: 5 mg/dL — AB
Leukocytes, UA: NEGATIVE
NITRITE: NEGATIVE
PROTEIN: NEGATIVE mg/dL
pH: 5 (ref 5.0–8.0)

## 2016-08-28 LAB — HEPATIC FUNCTION PANEL
ALBUMIN: 4 g/dL (ref 3.5–5.0)
ALT: 12 U/L — ABNORMAL LOW (ref 14–54)
AST: 24 U/L (ref 15–41)
Alkaline Phosphatase: 59 U/L (ref 38–126)
BILIRUBIN TOTAL: 0.5 mg/dL (ref 0.3–1.2)
Bilirubin, Direct: 0.1 mg/dL (ref 0.1–0.5)
Indirect Bilirubin: 0.4 mg/dL (ref 0.3–0.9)
Total Protein: 6.6 g/dL (ref 6.5–8.1)

## 2016-08-28 LAB — BRAIN NATRIURETIC PEPTIDE: B NATRIURETIC PEPTIDE 5: 23.8 pg/mL (ref 0.0–100.0)

## 2016-08-28 MED ORDER — ONDANSETRON HCL 4 MG/2ML IJ SOLN
4.0000 mg | Freq: Once | INTRAMUSCULAR | Status: AC
  Administered 2016-08-28: 4 mg via INTRAVENOUS
  Filled 2016-08-28: qty 2

## 2016-08-28 MED ORDER — METOCLOPRAMIDE HCL 5 MG/ML IJ SOLN
10.0000 mg | Freq: Once | INTRAMUSCULAR | Status: AC
Start: 2016-08-28 — End: 2016-08-28
  Administered 2016-08-28: 10 mg via INTRAVENOUS
  Filled 2016-08-28: qty 2

## 2016-08-28 MED ORDER — HYDROMORPHONE HCL 2 MG/ML IJ SOLN
1.0000 mg | Freq: Once | INTRAMUSCULAR | Status: AC
  Administered 2016-08-28: 1 mg via INTRAVENOUS
  Filled 2016-08-28: qty 1

## 2016-08-28 MED ORDER — LORAZEPAM 2 MG/ML IJ SOLN
1.0000 mg | Freq: Once | INTRAMUSCULAR | Status: AC
  Administered 2016-08-29: 1 mg via INTRAVENOUS
  Filled 2016-08-28: qty 1

## 2016-08-28 MED ORDER — FENTANYL CITRATE (PF) 100 MCG/2ML IJ SOLN
50.0000 ug | Freq: Once | INTRAMUSCULAR | Status: AC
  Administered 2016-08-28: 50 ug via INTRAVENOUS
  Filled 2016-08-28: qty 2

## 2016-08-28 MED ORDER — IOPAMIDOL (ISOVUE-300) INJECTION 61%
INTRAVENOUS | Status: AC
  Administered 2016-08-28: 100 mL
  Filled 2016-08-28: qty 100

## 2016-08-28 NOTE — ED Provider Notes (Signed)
[facility] DEPT Provider Note   CSN: [id number] Arrival date & time: 08/28/16  1841     History   Chief Complaint Chief Complaint  Patient presents with  . Chest Pain    HPI Cheryl Brown is a 81 y.o. female.  HPI   Severe epigastric pain, nausea began around 330PM, Significant vomiting, don't know how many times, can't even count.  Has not stopped vomiting since then. Zofran has not helped yet.  Had normal BM this morning but not passing flatus now. Epigastric pain is severe cramp, radiates around towards LUQ and to back. No chest pain. No diarrhea.   Has also had cough and mild dyspnea. No congestion or fevers. Some burning and itching with urination. Hx of COPD>   Past Medical History:  Diagnosis Date  . Blood transfusion   . COPD (chronic obstructive pulmonary disease) ([place])   . Diabetes mellitus   . Dysmetabolic syndrome   . Hyperlipemia   . Hypertension   . IBS (irritable bowel syndrome)   . Neoplasm of pituitary gland    Dr.Nudelman   . Palpitations   . Pituitary adenoma ([place]) 1991   Dr.Love and Dr.Nudelman   . Recurrent UTI 2013-14   Dr. Thomasene Mohair and Dr. Marvel Plan  . Reflux esophagitis   . Restless leg syndrome   . Scoliosis    Spinal Stenosis  . Shingles outbreak     Patient Active Problem List   Diagnosis Date Noted  . Small bowel obstruction 08/29/2016  . Obstructive sleep apnea 03/27/2015  . Bruising 11/16/2013  . Insomnia with sleep apnea 12/07/2012  . Recurrent UTI 11/14/2012  . Type 2 diabetes mellitus with neurological manifestations, uncontrolled ([place]) 06/12/2011  . VITAMIN D DEFICIENCY 08/13/2010  . UNSPECIFIED MYALGIA AND MYOSITIS 08/06/2010  . OSTEOPENIA 08/06/2010  . RETINAL HEMORRHAGE 01/07/2010  . COPD with chronic bronchitis ([place]) 03/29/2009  . PARESTHESIA 03/20/2009  . SPINAL STENOSIS, LUMBAR 11/01/2008  . FATIGUE 10/01/2008  . NIGHT SWEATS 10/01/2008  . ANOREXIA 10/01/2008  . ALLERGIC RHINITIS 01/05/2008  .  CONSTIPATION 12/08/2007  . THYROID NODULE 08/31/2007  . Essential hypertension 08/31/2007  . HYPERLIPIDEMIA 08/05/2007  . Dysmetabolic syndrome X [id number]  . PAIN IN JOINT, SITE UNSPECIFIED 08/05/2007  . PALPITATIONS 08/05/2007  . REFLUX ESOPHAGITIS, HX OF 08/05/2007  . RESTLESS LEG SYNDROME 02/28/2007  . IRRITABLE BOWEL SYNDROME 02/28/2007  . NEOP, BNG, PITUITARY GLAND 11/24/2006    Past Surgical History:  Procedure Laterality Date  . ANKLE FRACTURE SURGERY     left ankle  . BREAST LUMPECTOMY     4 lumps removed from breast, all benign   . COLONOSCOPY  2014   [facility], [facility]  . CYSTOSCOPY  2014    Dr Thomasene Mohair  . ESOPHAGEAL DILATION  01/2003  . FINGER AMPUTATION     related to work injury   . g2 p2    . KNEE SURGERY  [id number]    complicated by difficult resuscitation post anesthesia   . LAPAROSCOPIC CHOLECYSTECTOMY  03/2011   [facility], [place]     for dysfunctional bleeding   . pituitary adenoma     Dr. Erling Cruz  . UPPER GI ENDOSCOPY  2014   gastric polyp  . URETHRAL DILATION  2014    OB History    No data available       Home Medications    Prior to Admission medications   Medication Sig Start Date End Date Taking? Authorizing Provider  BD PEN  NEEDLE NANO U/F 32G X 4 MM MISC use as directed once daily 07/28/16  Yes Philemon Kingdom, MD  fluticasone furoate-vilanterol (BREO ELLIPTA) 100-25 MCG/INH AEPB Inhale 1 puff into the lungs daily.   Yes Historical Provider, MD  furosemide (LASIX) 20 MG tablet Take 1 tablet (20 mg total) by mouth daily. Patient taking differently: Take 20 mg by mouth daily as needed for fluid.  01/27/16  Yes Sherren Mocha, MD  glucose blood (ONE TOUCH ULTRA TEST) test strip 1 each by Other route daily. Use to check blood sugar daily Dx: E11.41 03/14/15  Yes Hendricks Limes, MD  metFORMIN (GLUCOPHAGE) 500 MG tablet Take 500 mg by mouth 2 (two) times daily with a meal.   Yes Historical Provider, MD  mupirocin  ointment (BACTROBAN) 2 % Applied twice a day to the septum;NOT into eyes. 11/06/14  Yes Hendricks Limes, MD  Omeprazole (PRILOSEC PO) Take 20 mg by mouth daily.    Yes Historical Provider, MD  [facility] LANCETS MISC Check blood sugar as directed 02/16/11  Yes Hendricks Limes, MD  polyethylene glycol powder (MIRALAX) powder Take 34 g by mouth daily. 2 capfuls daily per Urologist    Yes Historical Provider, MD  PREMARIN vaginal cream Place 1 application vaginally every other day. 08/19/16  Yes Historical Provider, MD  valsartan (DIOVAN) 160 MG tablet Take 1 tablet (160 mg total) by mouth daily. 09/23/15  Yes Sherren Mocha, MD  clonazePAM (KLONOPIN) 0.5 MG tablet Take 0.25-0.5 mg by mouth at bedtime as needed (sleep).    Historical Provider, MD  Fluticasone-Salmeterol (ADVAIR DISKUS) 100-50 MCG/DOSE AEPB Inhale 1 puff into the lungs every 12 (twelve) hours. Patient not taking: Reported on 08/28/2016 06/20/14   Hendricks Limes, MD  hydrochlorothiazide (HYDRODIURIL) 25 MG tablet Take 0.5 tablets (12.5 mg total) by mouth daily. Patient not taking: Reported on 08/28/2016 08/27/15   Sherren Mocha, MD    Family History Family History  Problem Relation Age of Onset  . Alzheimer's disease Mother   . Arthritis Mother   . Mental illness Mother     alzheimers  . Bone cancer Father   . Prostate cancer Father   . Diabetes Father   . Coronary artery disease Father   . Cancer Father     bone, prostate  . Alzheimer's disease Maternal Aunt   . Mental illness Maternal Aunt     alzheimers  . Mitral valve prolapse Sister   . Alzheimer's disease Sister   . Hypertension Sister   . Diabetes Sister   . Stroke Paternal Grandmother   . Osteoporosis Sister   . Mental illness Maternal Grandmother     alzheimers    Social History Social History  Substance Use Topics  . Smoking status: Former Smoker    Quit date: 08/10/1978  . Smokeless tobacco: Never Used  . Alcohol use No     Allergies   Morphine  and related; Codeine; Ipratropium-albuterol; Niaspan [niacin]; Norvasc [amlodipine besylate]; Pramipexole dihydrochloride; Pregabalin; Propoxyphene n-acetaminophen; Simvastatin; and Sulfonamide derivatives   Review of Systems Review of Systems  Constitutional: Negative for appetite change and fever.  HENT: Negative for congestion and sore throat.   Eyes: Negative for visual disturbance.  Respiratory: Positive for cough and shortness of breath.   Cardiovascular: Positive for chest pain (/epigastric pain).  Gastrointestinal: Positive for abdominal distention, abdominal pain (radiates towards left and through the back), nausea and vomiting. Negative for constipation and diarrhea.  Genitourinary: Positive for dysuria. Negative for difficulty urinating.  Musculoskeletal: Negative for back pain and neck pain.  Skin: Negative for rash.  Neurological: Negative for syncope and headaches.     Physical Exam Updated Vital Signs BP 109/80   Pulse 89   Temp 98.3 F (36.8 C) (Oral)   Resp 12   SpO2 94%   Physical Exam  Constitutional: She is oriented to person, place, and time. She appears well-developed and well-nourished. She appears ill (active emesis). No distress.  HENT:  Head: Normocephalic and atraumatic.  Eyes: Conjunctivae and EOM are normal.  Neck: Normal range of motion.  Cardiovascular: Normal rate, regular rhythm, normal heart sounds and intact distal pulses.  Exam reveals no gallop and no friction rub.   No murmur heard. Pulmonary/Chest: Effort normal and breath sounds normal. No respiratory distress. She has no wheezes. She has no rales.  Abdominal: Soft. She exhibits distension. There is tenderness (epigastric, LUQ). There is no guarding.  Musculoskeletal: She exhibits no edema or tenderness.  Neurological: She is alert and oriented to person, place, and time.  Skin: Skin is warm and dry. No rash noted. She is not diaphoretic. No erythema.  Nursing note and vitals  reviewed.    ED Treatments / Results  Labs (all labs ordered are listed, but only abnormal results are displayed) Labs Reviewed  BASIC METABOLIC PANEL - Abnormal; Notable for the following:       Result Value   Glucose, Bld 129 (*)    All other components within normal limits  HEPATIC FUNCTION PANEL - Abnormal; Notable for the following:    ALT 12 (*)    All other components within normal limits  URINALYSIS, ROUTINE W REFLEX MICROSCOPIC - Abnormal; Notable for the following:    Specific Gravity, Urine >1.046 (*)    Ketones, ur 5 (*)    All other components within normal limits  URINE CULTURE  CBC  BRAIN NATRIURETIC PEPTIDE  LIPASE, BLOOD  I-STAT TROPOININ, ED    EKG  EKG Interpretation  Date/Time:  Friday August 28 2016 18:56:59 EST Ventricular Rate:  60 PR Interval:    QRS Duration: 107 QT Interval:  440 QTC Calculation: 440 R Axis:   -55 Text Interpretation:  Sinus rhythm LAD, consider left anterior fascicular block Since prior ECG, aVL nonspecific TW change, no other significant findings or changes Confirmed by [facility] MD, Izel Eisenhardt ([id number]) on 08/28/2016 8:02:01 PM       Radiology Dg Chest 2 View  Result Date: 08/28/2016 CLINICAL DATA:  Upper abdominal pain that radiates around both sides that began today. Nausea. EXAM: CHEST  2 VIEW COMPARISON:  Chest x-rays dated 11/06/2014 and 07/11/2009. FINDINGS: Mild cardiomegaly is stable. Atherosclerotic changes again noted at the aortic arch. Coarse interstitial lung markings noted bilaterally suggesting some degree of chronic interstitial lung disease. Superimposed edema not excluded. No confluent opacity to suggest a developing pneumonia. No pleural effusion or pneumothorax seen. Within the left mid lung region, there is a small pulmonary nodule versus pulmonary vessel seen on end. IMPRESSION: 1. Cardiomegaly. 2. Coarse interstitial markings throughout both lungs, most likely chronic interstitial lung disease, superimposed  interstitial edema not excluded. 3. Subcentimeter pulmonary nodule versus pulmonary vessel seen on end, left mid lung region. Recommend chest CT for further characterization. 4. No evidence of pneumonia. 5. Aortic atherosclerosis. Electronically Signed   By: Franki Cabot M.D.   On: 08/28/2016 20:16   Ct Abdomen Pelvis W Contrast  Result Date: 08/28/2016 CLINICAL DATA:  Sudden abdominal pain and vomiting EXAM: CT ABDOMEN AND PELVIS  WITH CONTRAST TECHNIQUE: Multidetector CT imaging of the abdomen and pelvis was performed using the standard protocol following bolus administration of intravenous contrast. CONTRAST:  173mL ISOVUE-300 IOPAMIDOL (ISOVUE-300) INJECTION 61% COMPARISON:  Abdomen radiographs from 04/17/2012 FINDINGS: Lower chest: Mild cardiomegaly. No pericardial effusion. Small hiatal hernia. Atelectasis at each lung base. Faint hypoventilatory change within the lungs accounting for ground-glass opacities. Hepatobiliary: Cholecystectomy. Faint 13 mm right hepatic hypodensity either representing a small cyst or potentially a hemangioma. On repeat delayed imaging through the upper abdomen this finding appears to be smaller and may represent a hemangioma. Minimal intrahepatic ductal dilatation which can be seen in the setting of prior cholecystectomy. Pancreas: Mild fatty replaced appearance of the pancreas. No focal mass lesion. Spleen: No splenomegaly. Three hypodense foci within the spleen ranging in size from 4 mm to 11 mm are identified potentially representing cysts and/or hemangiomas as well. Adrenals/Urinary Tract: Normal bilateral adrenal glands. Interpolar right-sided 13 mm cyst with faint peripheral enhancement medially. Dedicated CT or MRI workup recommended without and with IV contrast. The urinary bladder is unremarkable and nondistended. No nephrolithiasis. No hydronephrosis. Stomach/Bowel: Moderate fluid-filled distention of small bowel. Normal small bowel rotation to the ligament of Treitz.  Abnormal dilatation of jejunal loops with fecalized material seen within measuring up to 3.7 cm. Areas of segmental transmural thickening suspicious for tandem inflammatory strictures are noted contributing to this appearance, example image 40, Series 203 in both lower quadrants. Prominence of the vasa recta noted. These findings may indicate inflammatory bowel disease as the cause of the strictures. The ileum and terminal ileum however are not affected in appearance. Vascular/Lymphatic: Aortoiliac atherosclerosis without aneurysm. No lymphadenopathy. Reproductive: Hysterectomy.  No adnexal mass Other: Small fat containing umbilical hernia. No free air or ascites. Musculoskeletal: No acute osseous abnormality. Mild degenerative disc disease L4-5. Bone islands of both iliac bones, right sacrum and left parasymphysis. IMPRESSION: 1. Fecalized material within dilated loops of jejunum measuring up to 3.7 cm in caliber with segmental tandem areas of apparent inflammatory stricturing noted along it's mid to distal aspect. Findings are suspicious for sequela of inflammatory bowel disease. Given gas and stool within large bowel and nondistended fluid-filled ileum, findings may represent a high grade partial SBO. 2. Hypodensities within the liver and spleen as above described possibly representing hemangiomas and/or cysts. 3. Interpolar 13 mm hypodense renal lesion on the right with subtle peripheral enhancement suggested. Dedicated CT without and with IV contrast or MRI recommended for further workup. 4. Aortoiliac atherosclerosis without aneurysm Electronically Signed   By: Ashley Royalty M.D.   On: 08/28/2016 23:22    Procedures Procedures (including critical care time)  Medications Ordered in ED Medications  ondansetron (ZOFRAN) injection 4 mg (4 mg Intravenous Given 08/28/16 1937)  ondansetron (ZOFRAN) injection 4 mg (4 mg Intravenous Given 08/28/16 2028)  fentaNYL (SUBLIMAZE) injection 50 mcg (50 mcg Intravenous  Given 08/28/16 2051)  metoCLOPramide (REGLAN) injection 10 mg (10 mg Intravenous Given 08/28/16 2151)  HYDROmorphone (DILAUDID) injection 1 mg (1 mg Intravenous Given 08/28/16 2154)  iopamidol (ISOVUE-300) 61 % injection (100 mLs  Contrast Given 08/28/16 2228)  LORazepam (ATIVAN) injection 1 mg (1 mg Intravenous Given 08/29/16 0002)     Initial Impression / Assessment and Plan / ED Course  I have reviewed the triage vital signs and the nursing notes.  Pertinent labs & imaging results that were available during my care of the patient were reviewed by me and considered in my medical decision making (see chart for details).  81 year old female with a history of hyperlipidemia, hypertension, restless leg syndrome, pituitary neoplasm, COPD, diabetes, cholecystectomy presents with concern for epigastric abdominal pain radiating towards the back, and nausea and vomiting.  EKG without significant changes. Troponin negative. Doubt flu in absence of fever. CXR shows no sign of pneumonia. Significant nausea vomiting with abdominal pain and decreased flatus concerning for possible obstruction and CT abdomen and pelvis ordered for further evaluation. CT concerning for possible high-grade small bowel obstruction, with inflammatory lesions of small bowel which may be consistent with inflammatory bowel disease. Patient does not have any history of inflammatory bowel disease. Nausea not controlled was Zofran 8 mg or Reglan 10 mg with continued emesis. Placed NG tube. Consulted General Surgery Dr. Hulen Skains who will come to bedside to evaluate the patient.  Will admit to hospitalist for further care.     Final Clinical Impressions(s) / ED Diagnoses   Final diagnoses:  Small bowel obstruction    New Prescriptions Current Discharge Medication List       Gareth Morgan, MD 08/29/16 [phone]

## 2016-08-28 NOTE — ED Triage Notes (Addendum)
Patient complains of sudden onset of epigastric pain that started at approximately 1600.  Patient states she thought the pain was GI related and took alkaseltzer with no relief.   Patient describes pain as starting in the center of her chest and back and radiating to her left flank. EMS gave patient 324 mg aspirin and 1x SL nitro PTA, patient states her pain reduced from 8/10 to 3/10 after administration of nitroglycerin.  Patient also received 4mg  zofran PTA.   Patient alert and oriented and in no apparent distress at this time.

## 2016-08-29 ENCOUNTER — Inpatient Hospital Stay (HOSPITAL_COMMUNITY): Payer: PPO

## 2016-08-29 ENCOUNTER — Encounter (HOSPITAL_COMMUNITY): Payer: Self-pay

## 2016-08-29 DIAGNOSIS — Z820 Family history of epilepsy and other diseases of the nervous system: Secondary | ICD-10-CM | POA: Diagnosis not present

## 2016-08-29 DIAGNOSIS — E1140 Type 2 diabetes mellitus with diabetic neuropathy, unspecified: Secondary | ICD-10-CM | POA: Diagnosis not present

## 2016-08-29 DIAGNOSIS — Z8249 Family history of ischemic heart disease and other diseases of the circulatory system: Secondary | ICD-10-CM | POA: Diagnosis not present

## 2016-08-29 DIAGNOSIS — E1165 Type 2 diabetes mellitus with hyperglycemia: Secondary | ICD-10-CM

## 2016-08-29 DIAGNOSIS — Z833 Family history of diabetes mellitus: Secondary | ICD-10-CM | POA: Diagnosis not present

## 2016-08-29 DIAGNOSIS — K56609 Unspecified intestinal obstruction, unspecified as to partial versus complete obstruction: Secondary | ICD-10-CM

## 2016-08-29 DIAGNOSIS — I119 Hypertensive heart disease without heart failure: Secondary | ICD-10-CM | POA: Diagnosis not present

## 2016-08-29 DIAGNOSIS — A084 Viral intestinal infection, unspecified: Secondary | ICD-10-CM | POA: Diagnosis not present

## 2016-08-29 DIAGNOSIS — Z4682 Encounter for fitting and adjustment of non-vascular catheter: Secondary | ICD-10-CM | POA: Diagnosis not present

## 2016-08-29 DIAGNOSIS — Z823 Family history of stroke: Secondary | ICD-10-CM | POA: Diagnosis not present

## 2016-08-29 DIAGNOSIS — Z886 Allergy status to analgesic agent status: Secondary | ICD-10-CM | POA: Diagnosis not present

## 2016-08-29 DIAGNOSIS — Z79899 Other long term (current) drug therapy: Secondary | ICD-10-CM | POA: Diagnosis not present

## 2016-08-29 DIAGNOSIS — Z90710 Acquired absence of both cervix and uterus: Secondary | ICD-10-CM | POA: Diagnosis not present

## 2016-08-29 DIAGNOSIS — K529 Noninfective gastroenteritis and colitis, unspecified: Secondary | ICD-10-CM | POA: Diagnosis not present

## 2016-08-29 DIAGNOSIS — I10 Essential (primary) hypertension: Secondary | ICD-10-CM | POA: Diagnosis not present

## 2016-08-29 DIAGNOSIS — Z8261 Family history of arthritis: Secondary | ICD-10-CM | POA: Diagnosis not present

## 2016-08-29 DIAGNOSIS — Z8262 Family history of osteoporosis: Secondary | ICD-10-CM | POA: Diagnosis not present

## 2016-08-29 DIAGNOSIS — Z9889 Other specified postprocedural states: Secondary | ICD-10-CM | POA: Diagnosis not present

## 2016-08-29 DIAGNOSIS — K56600 Partial intestinal obstruction, unspecified as to cause: Secondary | ICD-10-CM | POA: Diagnosis not present

## 2016-08-29 DIAGNOSIS — Z86010 Personal history of colonic polyps: Secondary | ICD-10-CM | POA: Diagnosis not present

## 2016-08-29 DIAGNOSIS — J449 Chronic obstructive pulmonary disease, unspecified: Secondary | ICD-10-CM | POA: Diagnosis not present

## 2016-08-29 DIAGNOSIS — Z888 Allergy status to other drugs, medicaments and biological substances status: Secondary | ICD-10-CM | POA: Diagnosis not present

## 2016-08-29 DIAGNOSIS — Z7951 Long term (current) use of inhaled steroids: Secondary | ICD-10-CM | POA: Diagnosis not present

## 2016-08-29 DIAGNOSIS — K5669 Other partial intestinal obstruction: Secondary | ICD-10-CM | POA: Diagnosis not present

## 2016-08-29 DIAGNOSIS — G2581 Restless legs syndrome: Secondary | ICD-10-CM | POA: Diagnosis not present

## 2016-08-29 DIAGNOSIS — Z7984 Long term (current) use of oral hypoglycemic drugs: Secondary | ICD-10-CM | POA: Diagnosis not present

## 2016-08-29 DIAGNOSIS — Z87891 Personal history of nicotine dependence: Secondary | ICD-10-CM | POA: Diagnosis not present

## 2016-08-29 DIAGNOSIS — Z89029 Acquired absence of unspecified finger(s): Secondary | ICD-10-CM | POA: Diagnosis not present

## 2016-08-29 DIAGNOSIS — Z885 Allergy status to narcotic agent status: Secondary | ICD-10-CM | POA: Diagnosis not present

## 2016-08-29 DIAGNOSIS — E1149 Type 2 diabetes mellitus with other diabetic neurological complication: Secondary | ICD-10-CM | POA: Diagnosis not present

## 2016-08-29 LAB — CBC WITH DIFFERENTIAL/PLATELET
BASOS PCT: 0 %
Basophils Absolute: 0 10*3/uL (ref 0.0–0.1)
EOS ABS: 0 10*3/uL (ref 0.0–0.7)
Eosinophils Relative: 0 %
HCT: 42.3 % (ref 36.0–46.0)
HEMOGLOBIN: 14 g/dL (ref 12.0–15.0)
Lymphocytes Relative: 15 %
Lymphs Abs: 1.2 10*3/uL (ref 0.7–4.0)
MCH: 30.7 pg (ref 26.0–34.0)
MCHC: 33.1 g/dL (ref 30.0–36.0)
MCV: 92.8 fL (ref 78.0–100.0)
Monocytes Absolute: 0.6 10*3/uL (ref 0.1–1.0)
Monocytes Relative: 7 %
NEUTROS PCT: 78 %
Neutro Abs: 6.3 10*3/uL (ref 1.7–7.7)
PLATELETS: 198 10*3/uL (ref 150–400)
RBC: 4.56 MIL/uL (ref 3.87–5.11)
RDW: 13.3 % (ref 11.5–15.5)
WBC: 8.2 10*3/uL (ref 4.0–10.5)

## 2016-08-29 LAB — COMPREHENSIVE METABOLIC PANEL
ALT: 13 U/L — AB (ref 14–54)
AST: 23 U/L (ref 15–41)
Albumin: 3.7 g/dL (ref 3.5–5.0)
Alkaline Phosphatase: 54 U/L (ref 38–126)
Anion gap: 9 (ref 5–15)
BILIRUBIN TOTAL: 0.9 mg/dL (ref 0.3–1.2)
BUN: 19 mg/dL (ref 6–20)
CALCIUM: 9 mg/dL (ref 8.9–10.3)
CHLORIDE: 107 mmol/L (ref 101–111)
CO2: 25 mmol/L (ref 22–32)
CREATININE: 0.69 mg/dL (ref 0.44–1.00)
Glucose, Bld: 116 mg/dL — ABNORMAL HIGH (ref 65–99)
Potassium: 4.1 mmol/L (ref 3.5–5.1)
Sodium: 141 mmol/L (ref 135–145)
TOTAL PROTEIN: 6 g/dL — AB (ref 6.5–8.1)

## 2016-08-29 LAB — GLUCOSE, CAPILLARY
GLUCOSE-CAPILLARY: 122 mg/dL — AB (ref 65–99)
GLUCOSE-CAPILLARY: 77 mg/dL (ref 65–99)
GLUCOSE-CAPILLARY: 79 mg/dL (ref 65–99)
GLUCOSE-CAPILLARY: 84 mg/dL (ref 65–99)

## 2016-08-29 MED ORDER — DEXTROSE-NACL 5-0.9 % IV SOLN
INTRAVENOUS | Status: DC
  Administered 2016-08-29: 1000 mL via INTRAVENOUS
  Administered 2016-08-30: 01:00:00 via INTRAVENOUS

## 2016-08-29 MED ORDER — HYDRALAZINE HCL 20 MG/ML IJ SOLN: 10.0000 mg | INTRAMUSCULAR | Status: DC | PRN

## 2016-08-29 MED ORDER — INSULIN ASPART 100 UNIT/ML ~~LOC~~ SOLN
0.0000 [IU] | SUBCUTANEOUS | Status: DC
  Administered 2016-08-29: 1 [IU] via SUBCUTANEOUS
  Administered 2016-08-30: 2 [IU] via SUBCUTANEOUS
  Administered 2016-08-30 – 2016-08-31 (×3): 1 [IU] via SUBCUTANEOUS

## 2016-08-29 MED ORDER — ONDANSETRON HCL 4 MG/2ML IJ SOLN
4.0000 mg | Freq: Four times a day (QID) | INTRAMUSCULAR | Status: DC | PRN
  Administered 2016-08-30 (×2): 4 mg via INTRAVENOUS
  Filled 2016-08-29 (×2): qty 2

## 2016-08-29 MED ORDER — FLUTICASONE FUROATE-VILANTEROL 100-25 MCG/INH IN AEPB
1.0000 | INHALATION_SPRAY | Freq: Every day | RESPIRATORY_TRACT | Status: DC
  Administered 2016-08-29 – 2016-08-31 (×2): 1 via RESPIRATORY_TRACT
  Filled 2016-08-29: qty 28

## 2016-08-29 MED ORDER — SODIUM CHLORIDE 0.9 % IV SOLN
INTRAVENOUS | Status: DC
  Administered 2016-08-29 (×2): via INTRAVENOUS

## 2016-08-29 MED ORDER — ACETAMINOPHEN 650 MG RE SUPP: 650.0000 mg | Freq: Four times a day (QID) | RECTAL | Status: DC | PRN

## 2016-08-29 MED ORDER — ACETAMINOPHEN 325 MG PO TABS
650.0000 mg | ORAL_TABLET | Freq: Four times a day (QID) | ORAL | Status: DC | PRN
  Administered 2016-08-30 (×2): 650 mg via ORAL
  Filled 2016-08-29 (×2): qty 2

## 2016-08-29 MED ORDER — FLUTICASONE FUROATE-VILANTEROL 100-25 MCG/INH IN AEPB: 1.0000 | INHALATION_SPRAY | Freq: Every day | RESPIRATORY_TRACT | Status: DC

## 2016-08-29 MED ORDER — DIATRIZOATE MEGLUMINE & SODIUM 66-10 % PO SOLN
90.0000 mL | Freq: Once | ORAL | Status: AC
  Administered 2016-08-29: 90 mL via NASOGASTRIC
  Filled 2016-08-29: qty 90

## 2016-08-29 MED ORDER — ONDANSETRON HCL 4 MG PO TABS: 4.0000 mg | ORAL_TABLET | Freq: Four times a day (QID) | ORAL | Status: DC | PRN

## 2016-08-29 MED ORDER — HYDROMORPHONE HCL 1 MG/ML IJ SOLN
1.0000 mg | Freq: Once | INTRAMUSCULAR | Status: AC
  Administered 2016-08-29: 1 mg via INTRAVENOUS
  Filled 2016-08-29: qty 1

## 2016-08-29 NOTE — Progress Notes (Signed)
MD Osei-Bonsu pg'd regarding CBG of 77 and now 79 at 1600 to consider possibility of adding dextrose to IVF since pt is NPO. Md called back and pt has been started on D5NS @125 /hr. Will continue to monitor CBG's.

## 2016-08-29 NOTE — Progress Notes (Signed)
PROGRESS NOTE  LANINA OFFUTT  [id number] DOB: April 30, 1936  DOA: 08/28/2016 PCP: Henrine Screws, MD   Brief Narrative:  Cheryl Brown is a 81 y.o. female with history of hypertension diabetes mellitus, COPD who presented with one-day history of sudden onset of abdominal pain with nausea and vomiting. She had not had a bowel movement on the day of admission, her last bowel movement having been that morning before the day of admission. CT scan in the ER showed features concerning for bowel obstruction with strictures. Incidental findings of hypodensities involving the liver, spleen and right kidney suspicious for hemangioma versus cyst were noted.  Chest x-ray showed cardiomegaly, chronic interstitial markings and possible pulmonary nodule.   NG tube was placed and is admitted for small bowel obstruction.   Assessment & Plan:   Principal Problem:   Small bowel obstruction Active Problems:   Essential hypertension   Type 2 diabetes mellitus with neurological manifestations, uncontrolled (HCC)   Obstructive sleep apnea   SBO (small bowel obstruction)  1. Small bowel obstruction:  Continue NG suction Bowel rest  IV fluid supplementation Supportive care Gen. surgery consulted, Dr. Hulen Skains  2 diabetes mellitus:  CBG monitoring with insulin as needed-which for hypo/hyper-glycemia.  3. Probable left midlung Pulmonary nodule:  Indicated chest CT when stabilized, possibly as outpatient.  4. Hepato-spleno/ renal hypodensities on CT: Incidental finding. Dedicated CT scan with IV contrast versus MRI, when stabilized, possibly as outpatient   DVT prophylaxis: SCD Code Status: Full Code Family Communication: Son and daughter at Bedside Disposition Plan: Home   Consultants:   General Surgery, Dr Hulen Skains  Procedures:  N/A  Antimicrobials:     N/A Subjective: Abdominal pain. No acute events reported overnight. Patient states her abdominal pains are better and so is  her nausea. No vomiting. Denies any BM today. No flatus. NG tube in situ.   Objective:  Vitals:   08/29/16 0115 08/29/16 0130 08/29/16 0327 08/29/16 0749  BP: 116/75 109/80 118/67   Pulse: 87 89 79   Resp: 14 12 18    Temp:   97.6 F (36.4 C)   TempSrc:   Oral   SpO2: 94% 94% 97% 98%  Weight:   88.1 kg (194 lb 3.6 oz)     Intake/Output Summary (Last 24 hours) at 08/29/16 1248 Last data filed at 08/29/16 0830  Gross per 24 hour  Intake                0 ml  Output                0 ml  Net                0 ml   Filed Weights   08/29/16 0327  Weight: 88.1 kg (194 lb 3.6 oz)    Examination:  General exam: Resting comfortably, no acute distress. Respiratory system: Clear to auscultation. Respiratory effort normal. Cardiovascular system: S1 & S2 heard, RRR. No JVD, murmurs, rubs, gallops or clicks. No pedal edema. Gastrointestinal system: Abdomen is mildly distended, soft, mild subumbilical tenderness without rebound tenderness. No organomegaly or masses felt. Hypoactive bowel sounds heard. Central nervous system: Alert and oriented. No focal neurological deficits. Extremities: Symmetric 5 x 5 power. Skin: No rashes, lesions or ulcers Psychiatry: Judgement and insight appear normal. Mood & affect appropriate.     Data Reviewed: I have personally reviewed following labs and imaging studies  CBC:  Recent Labs Lab 08/28/16 1926 08/29/16 0557  WBC 5.2 8.2  NEUTROABS  --  6.3  HGB 14.5 14.0  HCT 43.9 42.3  MCV 92.6 92.8  PLT 213 [id number]   Basic Metabolic Panel:  Recent Labs Lab 08/28/16 1926 08/29/16 0557  NA 141 141  K 4.2 4.1  CL 103 107  CO2 26 25  GLUCOSE 129* 116*  BUN 16 19  CREATININE 0.67 0.69  CALCIUM 9.6 9.0   GFR: CrCl cannot be calculated (Unknown ideal weight.). Liver Function Tests:  Recent Labs Lab 08/28/16 1926 08/29/16 0557  AST 24 23  ALT 12* 13*  ALKPHOS 59 54  BILITOT 0.5 0.9  PROT 6.6 6.0*  ALBUMIN 4.0 3.7    Recent Labs Lab  08/28/16 1926  LIPASE 21   No results for input(s): AMMONIA in the last 168 hours. Coagulation Profile: No results for input(s): INR, PROTIME in the last 168 hours. Cardiac Enzymes: No results for input(s): CKTOTAL, CKMB, CKMBINDEX, TROPONINI in the last 168 hours. BNP (last 3 results) No results for input(s): PROBNP in the last 8760 hours. HbA1C: No results for input(s): HGBA1C in the last 72 hours. CBG:  Recent Labs Lab 08/29/16 0505 08/29/16 1111  GLUCAP 122* 77   Lipid Profile: No results for input(s): CHOL, HDL, LDLCALC, TRIG, CHOLHDL, LDLDIRECT in the last 72 hours. Thyroid Function Tests: No results for input(s): TSH, T4TOTAL, FREET4, T3FREE, THYROIDAB in the last 72 hours. Anemia Panel: No results for input(s): VITAMINB12, FOLATE, FERRITIN, TIBC, IRON, RETICCTPCT in the last 72 hours.  Sepsis Labs:  Recent Labs Lab 08/28/16 1926 08/29/16 0557  WBC 5.2 8.2    No results found for this or any previous visit (from the past 240 hour(s)).       Radiology Studies: Dg Chest 2 View  Result Date: 08/28/2016 CLINICAL DATA:  Upper abdominal pain that radiates around both sides that began today. Nausea. EXAM: CHEST  2 VIEW COMPARISON:  Chest x-rays dated 11/06/2014 and 07/11/2009. FINDINGS: Mild cardiomegaly is stable. Atherosclerotic changes again noted at the aortic arch. Coarse interstitial lung markings noted bilaterally suggesting some degree of chronic interstitial lung disease. Superimposed edema not excluded. No confluent opacity to suggest a developing pneumonia. No pleural effusion or pneumothorax seen. Within the left mid lung region, there is a small pulmonary nodule versus pulmonary vessel seen on end. IMPRESSION: 1. Cardiomegaly. 2. Coarse interstitial markings throughout both lungs, most likely chronic interstitial lung disease, superimposed interstitial edema not excluded. 3. Subcentimeter pulmonary nodule versus pulmonary vessel seen on end, left mid lung  region. Recommend chest CT for further characterization. 4. No evidence of pneumonia. 5. Aortic atherosclerosis. Electronically Signed   By: Franki Cabot M.D.   On: 08/28/2016 20:16   Ct Abdomen Pelvis W Contrast  Result Date: 08/28/2016 CLINICAL DATA:  Sudden abdominal pain and vomiting EXAM: CT ABDOMEN AND PELVIS WITH CONTRAST TECHNIQUE: Multidetector CT imaging of the abdomen and pelvis was performed using the standard protocol following bolus administration of intravenous contrast. CONTRAST:  162mL ISOVUE-300 IOPAMIDOL (ISOVUE-300) INJECTION 61% COMPARISON:  Abdomen radiographs from 04/17/2012 FINDINGS: Lower chest: Mild cardiomegaly. No pericardial effusion. Small hiatal hernia. Atelectasis at each lung base. Faint hypoventilatory change within the lungs accounting for ground-glass opacities. Hepatobiliary: Cholecystectomy. Faint 13 mm right hepatic hypodensity either representing a small cyst or potentially a hemangioma. On repeat delayed imaging through the upper abdomen this finding appears to be smaller and may represent a hemangioma. Minimal intrahepatic ductal dilatation which can be seen in the setting of prior cholecystectomy. Pancreas: Mild fatty replaced appearance of  the pancreas. No focal mass lesion. Spleen: No splenomegaly. Three hypodense foci within the spleen ranging in size from 4 mm to 11 mm are identified potentially representing cysts and/or hemangiomas as well. Adrenals/Urinary Tract: Normal bilateral adrenal glands. Interpolar right-sided 13 mm cyst with faint peripheral enhancement medially. Dedicated CT or MRI workup recommended without and with IV contrast. The urinary bladder is unremarkable and nondistended. No nephrolithiasis. No hydronephrosis. Stomach/Bowel: Moderate fluid-filled distention of small bowel. Normal small bowel rotation to the ligament of Treitz. Abnormal dilatation of jejunal loops with fecalized material seen within measuring up to 3.7 cm. Areas of segmental  transmural thickening suspicious for tandem inflammatory strictures are noted contributing to this appearance, example image 40, Series 203 in both lower quadrants. Prominence of the vasa recta noted. These findings may indicate inflammatory bowel disease as the cause of the strictures. The ileum and terminal ileum however are not affected in appearance. Vascular/Lymphatic: Aortoiliac atherosclerosis without aneurysm. No lymphadenopathy. Reproductive: Hysterectomy.  No adnexal mass Other: Small fat containing umbilical hernia. No free air or ascites. Musculoskeletal: No acute osseous abnormality. Mild degenerative disc disease L4-5. Bone islands of both iliac bones, right sacrum and left parasymphysis. IMPRESSION: 1. Fecalized material within dilated loops of jejunum measuring up to 3.7 cm in caliber with segmental tandem areas of apparent inflammatory stricturing noted along it's mid to distal aspect. Findings are suspicious for sequela of inflammatory bowel disease. Given gas and stool within large bowel and nondistended fluid-filled ileum, findings may represent a high grade partial SBO. 2. Hypodensities within the liver and spleen as above described possibly representing hemangiomas and/or cysts. 3. Interpolar 13 mm hypodense renal lesion on the right with subtle peripheral enhancement suggested. Dedicated CT without and with IV contrast or MRI recommended for further workup. 4. Aortoiliac atherosclerosis without aneurysm Electronically Signed   By: Ashley Royalty M.D.   On: 08/28/2016 23:22        Scheduled Meds: . fluticasone furoate-vilanterol  1 puff Inhalation Daily  . insulin aspart  0-9 Units Subcutaneous Q4H   Continuous Infusions: . sodium chloride 100 mL/hr at 08/29/16 0505     LOS: 0 days    Time spent: 62    OSEI-BONSU,Jerney Baksh, MD [facility] Pager [phone]  If 7PM-7AM, please contact night-coverage [email or web address] Password [facility] 08/29/2016, 12:48 PM

## 2016-08-29 NOTE — ED Notes (Signed)
Care handoff given to [place], [place].

## 2016-08-29 NOTE — Consult Note (Signed)
Reason for Consult:High grade partial small bowel obstruction by CT Referring Physician: Genene Churn is an 81 y.o. female.  HPI: Patient has been sick since yesterday afternoon about three with acute onset of abdominal discomfort.  She has never had this pain before.  Pain started near her back bilaterally radiating across the front.  No fevers or chllls, Had some recent nausea.  NGT palced in the ED  Past Medical History:  Diagnosis Date  . Blood transfusion   . COPD (chronic obstructive pulmonary disease) ([place])   . Diabetes mellitus   . Dysmetabolic syndrome   . Hyperlipemia   . Hypertension   . IBS (irritable bowel syndrome)   . Neoplasm of pituitary gland    Dr.Nudelman   . Palpitations   . Pituitary adenoma ([place]) 1991   Dr.Love and Dr.Nudelman   . Recurrent UTI 2013-14   Dr. Thomasene Mohair and Dr. Marvel Plan  . Reflux esophagitis   . Restless leg syndrome   . Scoliosis    Spinal Stenosis  . Shingles outbreak     Past Surgical History:  Procedure Laterality Date  . ANKLE FRACTURE SURGERY     left ankle  . BREAST LUMPECTOMY     4 lumps removed from breast, all benign   . COLONOSCOPY  2014   [facility], [facility]  . CYSTOSCOPY  2014    Dr Thomasene Mohair  . ESOPHAGEAL DILATION  01/2003  . FINGER AMPUTATION     related to work injury   . g2 p2    . KNEE SURGERY  [id number]    complicated by difficult resuscitation post anesthesia   . LAPAROSCOPIC CHOLECYSTECTOMY  03/2011   [facility], [facility]     for dysfunctional bleeding   . pituitary adenoma     Dr. Erling Cruz  . UPPER GI ENDOSCOPY  2014   gastric polyp  . URETHRAL DILATION  2014    Family History  Problem Relation Age of Onset  . Alzheimer's disease Mother   . Arthritis Mother   . Mental illness Mother     alzheimers  . Bone cancer Father   . Prostate cancer Father   . Diabetes Father   . Coronary artery disease Father   . Cancer Father     bone, prostate  . Alzheimer's disease  Maternal Aunt   . Mental illness Maternal Aunt     alzheimers  . Mitral valve prolapse Sister   . Alzheimer's disease Sister   . Hypertension Sister   . Diabetes Sister   . Stroke Paternal Grandmother   . Osteoporosis Sister   . Mental illness Maternal Grandmother     alzheimers    Social History:  reports that she quit smoking about 38 years ago. She has never used smokeless tobacco. She reports that she does not drink alcohol or use drugs.  Allergies:  Allergies  Allergen Reactions  . Morphine And Related Shortness Of Breath  . Codeine     nausea  . Ipratropium-Albuterol     REACTION: VERY NERVOUS  . Niaspan [Niacin]     Abdominal pain  . Norvasc [Amlodipine Besylate]     10/15/14 headache 08/27/15 abdominal pain, indigestion  . Pramipexole Dihydrochloride     Rash & itching  . Pregabalin     REACTION: swelling  . Propoxyphene N-Acetaminophen     REACTION: UPSET STOMACH  . Simvastatin     REACTION: Leg cramps  . Sulfonamide Derivatives Itching    Medications:  I have reviewed the patient's current medications.  Results for orders placed or performed during the hospital encounter of 08/28/16 (from the past 48 hour(s))  Basic metabolic panel     Status: Abnormal   Collection Time: 08/28/16  7:26 PM  Result Value Ref Range   Sodium 141 135 - 145 mmol/L   Potassium 4.2 3.5 - 5.1 mmol/L   Chloride 103 101 - 111 mmol/L   CO2 26 22 - 32 mmol/L   Glucose, Bld 129 (H) 65 - 99 mg/dL   BUN 16 6 - 20 mg/dL   Creatinine, Ser 0.67 0.44 - 1.00 mg/dL   Calcium 9.6 8.9 - 10.3 mg/dL   GFR calc non Af Amer >60 >60 mL/min   GFR calc Af Amer >60 >60 mL/min    Comment: (NOTE) The eGFR has been calculated using the CKD EPI equation. This calculation has not been validated in all clinical situations. eGFR's persistently <60 mL/min signify possible Chronic Kidney Disease.    Anion gap 12 5 - 15  CBC     Status: None   Collection Time: 08/28/16  7:26 PM  Result Value Ref Range    WBC 5.2 4.0 - 10.5 K/uL   RBC 4.74 3.87 - 5.11 MIL/uL   Hemoglobin 14.5 12.0 - 15.0 g/dL   HCT 43.9 36.0 - 46.0 %   MCV 92.6 78.0 - 100.0 fL   MCH 30.6 26.0 - 34.0 pg   MCHC 33.0 30.0 - 36.0 g/dL   RDW 13.0 11.5 - 15.5 %   Platelets 213 150 - 400 K/uL  Brain natriuretic peptide     Status: None   Collection Time: 08/28/16  7:26 PM  Result Value Ref Range   B Natriuretic Peptide 23.8 0.0 - 100.0 pg/mL  Lipase, blood     Status: None   Collection Time: 08/28/16  7:26 PM  Result Value Ref Range   Lipase 21 11 - 51 U/L  Hepatic function panel     Status: Abnormal   Collection Time: 08/28/16  7:26 PM  Result Value Ref Range   Total Protein 6.6 6.5 - 8.1 g/dL   Albumin 4.0 3.5 - 5.0 g/dL   AST 24 15 - 41 U/L   ALT 12 (L) 14 - 54 U/L   Alkaline Phosphatase 59 38 - 126 U/L   Total Bilirubin 0.5 0.3 - 1.2 mg/dL   Bilirubin, Direct 0.1 0.1 - 0.5 mg/dL   Indirect Bilirubin 0.4 0.3 - 0.9 mg/dL  I-stat troponin, ED     Status: None   Collection Time: 08/28/16  7:59 PM  Result Value Ref Range   Troponin i, poc 0.00 0.00 - 0.08 ng/mL   Comment 3            Comment: Due to the release kinetics of cTnI, a negative result within the first hours of the onset of symptoms does not rule out myocardial infarction with certainty. If myocardial infarction is still suspected, repeat the test at appropriate intervals.   Urinalysis, Routine w reflex microscopic     Status: Abnormal   Collection Time: 08/28/16 11:15 PM  Result Value Ref Range   Color, Urine YELLOW YELLOW   APPearance CLEAR CLEAR   Specific Gravity, Urine >1.046 (H) 1.005 - 1.030   pH 5.0 5.0 - 8.0   Glucose, UA NEGATIVE NEGATIVE mg/dL   Hgb urine dipstick NEGATIVE NEGATIVE   Bilirubin Urine NEGATIVE NEGATIVE   Ketones, ur 5 (A) NEGATIVE mg/dL   Protein, ur  NEGATIVE NEGATIVE mg/dL   Nitrite NEGATIVE NEGATIVE   Leukocytes, UA NEGATIVE NEGATIVE  Glucose, capillary     Status: Abnormal   Collection Time: 08/29/16  5:05 AM   Result Value Ref Range   Glucose-Capillary 122 (H) 65 - 99 mg/dL    Dg Chest 2 View  Result Date: 08/28/2016 CLINICAL DATA:  Upper abdominal pain that radiates around both sides that began today. Nausea. EXAM: CHEST  2 VIEW COMPARISON:  Chest x-rays dated 11/06/2014 and 07/11/2009. FINDINGS: Mild cardiomegaly is stable. Atherosclerotic changes again noted at the aortic arch. Coarse interstitial lung markings noted bilaterally suggesting some degree of chronic interstitial lung disease. Superimposed edema not excluded. No confluent opacity to suggest a developing pneumonia. No pleural effusion or pneumothorax seen. Within the left mid lung region, there is a small pulmonary nodule versus pulmonary vessel seen on end. IMPRESSION: 1. Cardiomegaly. 2. Coarse interstitial markings throughout both lungs, most likely chronic interstitial lung disease, superimposed interstitial edema not excluded. 3. Subcentimeter pulmonary nodule versus pulmonary vessel seen on end, left mid lung region. Recommend chest CT for further characterization. 4. No evidence of pneumonia. 5. Aortic atherosclerosis. Electronically Signed   By: Franki Cabot M.D.   On: 08/28/2016 20:16   Ct Abdomen Pelvis W Contrast  Result Date: 08/28/2016 CLINICAL DATA:  Sudden abdominal pain and vomiting EXAM: CT ABDOMEN AND PELVIS WITH CONTRAST TECHNIQUE: Multidetector CT imaging of the abdomen and pelvis was performed using the standard protocol following bolus administration of intravenous contrast. CONTRAST:  19m ISOVUE-300 IOPAMIDOL (ISOVUE-300) INJECTION 61% COMPARISON:  Abdomen radiographs from 04/17/2012 FINDINGS: Lower chest: Mild cardiomegaly. No pericardial effusion. Small hiatal hernia. Atelectasis at each lung base. Faint hypoventilatory change within the lungs accounting for ground-glass opacities. Hepatobiliary: Cholecystectomy. Faint 13 mm right hepatic hypodensity either representing a small cyst or potentially a hemangioma. On  repeat delayed imaging through the upper abdomen this finding appears to be smaller and may represent a hemangioma. Minimal intrahepatic ductal dilatation which can be seen in the setting of prior cholecystectomy. Pancreas: Mild fatty replaced appearance of the pancreas. No focal mass lesion. Spleen: No splenomegaly. Three hypodense foci within the spleen ranging in size from 4 mm to 11 mm are identified potentially representing cysts and/or hemangiomas as well. Adrenals/Urinary Tract: Normal bilateral adrenal glands. Interpolar right-sided 13 mm cyst with faint peripheral enhancement medially. Dedicated CT or MRI workup recommended without and with IV contrast. The urinary bladder is unremarkable and nondistended. No nephrolithiasis. No hydronephrosis. Stomach/Bowel: Moderate fluid-filled distention of small bowel. Normal small bowel rotation to the ligament of Treitz. Abnormal dilatation of jejunal loops with fecalized material seen within measuring up to 3.7 cm. Areas of segmental transmural thickening suspicious for tandem inflammatory strictures are noted contributing to this appearance, example image 40, Series 203 in both lower quadrants. Prominence of the vasa recta noted. These findings may indicate inflammatory bowel disease as the cause of the strictures. The ileum and terminal ileum however are not affected in appearance. Vascular/Lymphatic: Aortoiliac atherosclerosis without aneurysm. No lymphadenopathy. Reproductive: Hysterectomy.  No adnexal mass Other: Small fat containing umbilical hernia. No free air or ascites. Musculoskeletal: No acute osseous abnormality. Mild degenerative disc disease L4-5. Bone islands of both iliac bones, right sacrum and left parasymphysis. IMPRESSION: 1. Fecalized material within dilated loops of jejunum measuring up to 3.7 cm in caliber with segmental tandem areas of apparent inflammatory stricturing noted along it's mid to distal aspect. Findings are suspicious for  sequela of inflammatory bowel disease. Given gas  and stool within large bowel and nondistended fluid-filled ileum, findings may represent a high grade partial SBO. 2. Hypodensities within the liver and spleen as above described possibly representing hemangiomas and/or cysts. 3. Interpolar 13 mm hypodense renal lesion on the right with subtle peripheral enhancement suggested. Dedicated CT without and with IV contrast or MRI recommended for further workup. 4. Aortoiliac atherosclerosis without aneurysm Electronically Signed   By: Ashley Royalty M.D.   On: 08/28/2016 23:22    Review of Systems  Constitutional: Negative for chills and fever.  Gastrointestinal: Positive for abdominal pain, constipation, diarrhea, nausea and vomiting. Negative for blood in stool.  All other systems reviewed and are negative.  Blood pressure 118/67, pulse 79, temperature 97.6 F (36.4 C), temperature source Oral, resp. rate 18, weight 88.1 kg (194 lb 3.6 oz), SpO2 97 %. Physical Exam  Vitals reviewed. Constitutional: She is oriented to person, place, and time. She appears well-developed. No distress.  Obese  HENT:  Head: Normocephalic and atraumatic.  Eyes: Conjunctivae and EOM are normal. Pupils are equal, round, and reactive to light.  Neck: Normal range of motion. Neck supple.  Cardiovascular: Normal rate, regular rhythm, normal heart sounds and intact distal pulses.   Warm LEs with strong DP pulses bilaterally.  Respiratory: Effort normal and breath sounds normal. She has no wheezes. She has no rales.  GI: Soft. Normal appearance. She exhibits no shifting dullness, no abdominal bruit and no mass. Bowel sounds are decreased. There is no tenderness.  Has NGT in place with minimal output.  Patient states that she does feel better after the NGT was placed  Musculoskeletal: Normal range of motion.  Neurological: She is alert and oriented to person, place, and time. She has normal reflexes.  Skin: Skin is warm and dry.   Psychiatric: Her behavior is normal. She exhibits a depressed mood (Seemed depressed to me).    Assessment/Plan: Unusual CT scan of the abdomen with some thickend small bowel loops in the center of her abdomen with possible strictures.  No definitive SBO with acute transition point  NGT decompression until she declares herself  Etsuko Dierolf 08/29/2016, 5:56 AM

## 2016-08-29 NOTE — H&P (Signed)
History and Physical    Cheryl Brown [id number] DOB: 04-20-1936 DOA: 08/28/2016  PCP: Henrine Screws, MD  Patient coming from: Home.  Chief Complaint: Nausea vomiting with abdominal pain.  HPI: Cheryl Brown is a 81 y.o. female with history of hypertension diabetes mellitus, COPD presents to the ER because of sudden onset of abdominal pain and nausea vomiting. Patient's symptoms started last evening around 3 PM at home. Patient had lower abdominal pain with multiple episodes of vomiting. CT scan in the ER shows features concerning for bowel obstruction with strictures. On call surgeon Dr. Hulen Skains has been consulted and patient is being admitted for further management. NG tube was placed. Patient otherwise denies any chest pain or shortness of breath. His last bowel movement was yesterday morning.  ED Course: CT scan was done which was showing features concerning for small bowel obstruction or strictures at the jejunum. Chest x-ray shows chronic findings including cardiomegaly and chronic interstitial markings and possible pulmonary nodule. NG tube was placed for small bowel obstruction.  Review of Systems: As per HPI, rest all negative.   Past Medical History:  Diagnosis Date  . Blood transfusion   . COPD (chronic obstructive pulmonary disease) ([place])   . Diabetes mellitus   . Dysmetabolic syndrome   . Hyperlipemia   . Hypertension   . IBS (irritable bowel syndrome)   . Neoplasm of pituitary gland    Dr.Nudelman   . Palpitations   . Pituitary adenoma ([place]) 1991   Dr.Love and Dr.Nudelman   . Recurrent UTI 2013-14   Dr. Thomasene Mohair and Dr. Marvel Plan  . Reflux esophagitis   . Restless leg syndrome   . Scoliosis    Spinal Stenosis  . Shingles outbreak     Past Surgical History:  Procedure Laterality Date  . ANKLE FRACTURE SURGERY     left ankle  . BREAST LUMPECTOMY     4 lumps removed from breast, all benign   . COLONOSCOPY  2014   [facility], [facility]  .  CYSTOSCOPY  2014    Dr Thomasene Mohair  . ESOPHAGEAL DILATION  01/2003  . FINGER AMPUTATION     related to work injury   . g2 p2    . KNEE SURGERY  [id number]    complicated by difficult resuscitation post anesthesia   . LAPAROSCOPIC CHOLECYSTECTOMY  03/2011   [facility], [place]     for dysfunctional bleeding   . pituitary adenoma     Dr. Erling Cruz  . UPPER GI ENDOSCOPY  2014   gastric polyp  . URETHRAL DILATION  2014     reports that she quit smoking about 38 years ago. She has never used smokeless tobacco. She reports that she does not drink alcohol or use drugs.  Allergies  Allergen Reactions  . Morphine And Related Shortness Of Breath  . Codeine     nausea  . Ipratropium-Albuterol     REACTION: VERY NERVOUS  . Niaspan [Niacin]     Abdominal pain  . Norvasc [Amlodipine Besylate]     10/15/14 headache 08/27/15 abdominal pain, indigestion  . Pramipexole Dihydrochloride     Rash & itching  . Pregabalin     REACTION: swelling  . Propoxyphene N-Acetaminophen     REACTION: UPSET STOMACH  . Simvastatin     REACTION: Leg cramps  . Sulfonamide Derivatives Itching    Family History  Problem Relation Age of Onset  . Alzheimer's disease Mother   . Arthritis Mother   .  Mental illness Mother     alzheimers  . Bone cancer Father   . Prostate cancer Father   . Diabetes Father   . Coronary artery disease Father   . Cancer Father     bone, prostate  . Alzheimer's disease Maternal Aunt   . Mental illness Maternal Aunt     alzheimers  . Mitral valve prolapse Sister   . Alzheimer's disease Sister   . Hypertension Sister   . Diabetes Sister   . Stroke Paternal Grandmother   . Osteoporosis Sister   . Mental illness Maternal Grandmother     alzheimers    Prior to Admission medications   Medication Sig Start Date End Date Taking? Authorizing Provider  BD PEN NEEDLE NANO U/F 32G X 4 MM MISC use as directed once daily 07/28/16  Yes Philemon Kingdom, MD  fluticasone  furoate-vilanterol (BREO ELLIPTA) 100-25 MCG/INH AEPB Inhale 1 puff into the lungs daily.   Yes Historical Provider, MD  furosemide (LASIX) 20 MG tablet Take 1 tablet (20 mg total) by mouth daily. Patient taking differently: Take 20 mg by mouth daily as needed for fluid.  01/27/16  Yes Sherren Mocha, MD  glucose blood (ONE TOUCH ULTRA TEST) test strip 1 each by Other route daily. Use to check blood sugar daily Dx: E11.41 03/14/15  Yes Hendricks Limes, MD  metFORMIN (GLUCOPHAGE) 500 MG tablet Take 500 mg by mouth 2 (two) times daily with a meal.   Yes Historical Provider, MD  mupirocin ointment (BACTROBAN) 2 % Applied twice a day to the septum;NOT into eyes. 11/06/14  Yes Hendricks Limes, MD  Omeprazole (PRILOSEC PO) Take 20 mg by mouth daily.    Yes Historical Provider, MD  [facility] LANCETS MISC Check blood sugar as directed 02/16/11  Yes Hendricks Limes, MD  polyethylene glycol powder (MIRALAX) powder Take 34 g by mouth daily. 2 capfuls daily per Urologist    Yes Historical Provider, MD  PREMARIN vaginal cream Place 1 application vaginally every other day. 08/19/16  Yes Historical Provider, MD  valsartan (DIOVAN) 160 MG tablet Take 1 tablet (160 mg total) by mouth daily. 09/23/15  Yes Sherren Mocha, MD  clonazePAM (KLONOPIN) 0.5 MG tablet Take 0.25-0.5 mg by mouth at bedtime as needed (sleep).    Historical Provider, MD  Fluticasone-Salmeterol (ADVAIR DISKUS) 100-50 MCG/DOSE AEPB Inhale 1 puff into the lungs every 12 (twelve) hours. Patient not taking: Reported on 08/28/2016 06/20/14   Hendricks Limes, MD  hydrochlorothiazide (HYDRODIURIL) 25 MG tablet Take 0.5 tablets (12.5 mg total) by mouth daily. Patient not taking: Reported on 08/28/2016 08/27/15   Sherren Mocha, MD    Physical Exam: Vitals:   08/29/16 0100 08/29/16 0115 08/29/16 0130 08/29/16 0327  BP: 126/88 116/75 109/80 118/67  Pulse: 88 87 89 79  Resp: 21 14 12 18   Temp:    97.6 F (36.4 C)  TempSrc:    Oral  SpO2: 95% 94%  94% 97%  Weight:    88.1 kg (194 lb 3.6 oz)      Constitutional: Moderately built and nourished. Vitals:   08/29/16 0100 08/29/16 0115 08/29/16 0130 08/29/16 0327  BP: 126/88 116/75 109/80 118/67  Pulse: 88 87 89 79  Resp: 21 14 12 18   Temp:    97.6 F (36.4 C)  TempSrc:    Oral  SpO2: 95% 94% 94% 97%  Weight:    88.1 kg (194 lb 3.6 oz)   Eyes: Anicteric no pallor. ENMT: No discharge  from the ears eyes nose or mouth. Neck: No mass felt. No JVD appreciated. Respiratory: No rhonchi or crepitations. Cardiovascular: S1 and S2 heard no murmurs appreciated. Abdomen: Mildly distended nontender bowel sounds are appreciated. Musculoskeletal: No edema. No joint effusion. Skin: No rash skin appears warm. Neurologic: Alert awake oriented to time place and person. Moves all extremities. Psychiatric: Appears normal. Normal affect.   Labs on Admission: I have personally reviewed following labs and imaging studies  CBC:  Recent Labs Lab 08/28/16 1926  WBC 5.2  HGB 14.5  HCT 43.9  MCV 92.6  PLT 123456   Basic Metabolic Panel:  Recent Labs Lab 08/28/16 1926  NA 141  K 4.2  CL 103  CO2 26  GLUCOSE 129*  BUN 16  CREATININE 0.67  CALCIUM 9.6   GFR: CrCl cannot be calculated (Unknown ideal weight.). Liver Function Tests:  Recent Labs Lab 08/28/16 1926  AST 24  ALT 12*  ALKPHOS 59  BILITOT 0.5  PROT 6.6  ALBUMIN 4.0    Recent Labs Lab 08/28/16 1926  LIPASE 21   No results for input(s): AMMONIA in the last 168 hours. Coagulation Profile: No results for input(s): INR, PROTIME in the last 168 hours. Cardiac Enzymes: No results for input(s): CKTOTAL, CKMB, CKMBINDEX, TROPONINI in the last 168 hours. BNP (last 3 results) No results for input(s): PROBNP in the last 8760 hours. HbA1C: No results for input(s): HGBA1C in the last 72 hours. CBG: No results for input(s): GLUCAP in the last 168 hours. Lipid Profile: No results for input(s): CHOL, HDL, LDLCALC, TRIG,  CHOLHDL, LDLDIRECT in the last 72 hours. Thyroid Function Tests: No results for input(s): TSH, T4TOTAL, FREET4, T3FREE, THYROIDAB in the last 72 hours. Anemia Panel: No results for input(s): VITAMINB12, FOLATE, FERRITIN, TIBC, IRON, RETICCTPCT in the last 72 hours. Urine analysis:    Component Value Date/Time   COLORURINE YELLOW 08/28/2016 2315   APPEARANCEUR CLEAR 08/28/2016 2315   LABSPEC >1.046 (H) 08/28/2016 2315   PHURINE 5.0 08/28/2016 2315   GLUCOSEU NEGATIVE 08/28/2016 2315   HGBUR NEGATIVE 08/28/2016 2315   HGBUR negative 01/05/2008 1445   BILIRUBINUR NEGATIVE 08/28/2016 2315   BILIRUBINUR Neg 11/14/2012 1144   KETONESUR 5 (A) 08/28/2016 2315   PROTEINUR NEGATIVE 08/28/2016 2315   UROBILINOGEN 0.2 11/14/2012 1144   UROBILINOGEN 0.2 04/17/2012 1601   NITRITE NEGATIVE 08/28/2016 2315   LEUKOCYTESUR NEGATIVE 08/28/2016 2315   Sepsis Labs: @LABRCNTIP (procalcitonin:4,lacticidven:4) )No results found for this or any previous visit (from the past 240 hour(s)).   Radiological Exams on Admission: Dg Chest 2 View  Result Date: 08/28/2016 CLINICAL DATA:  Upper abdominal pain that radiates around both sides that began today. Nausea. EXAM: CHEST  2 VIEW COMPARISON:  Chest x-rays dated 11/06/2014 and 07/11/2009. FINDINGS: Mild cardiomegaly is stable. Atherosclerotic changes again noted at the aortic arch. Coarse interstitial lung markings noted bilaterally suggesting some degree of chronic interstitial lung disease. Superimposed edema not excluded. No confluent opacity to suggest a developing pneumonia. No pleural effusion or pneumothorax seen. Within the left mid lung region, there is a small pulmonary nodule versus pulmonary vessel seen on end. IMPRESSION: 1. Cardiomegaly. 2. Coarse interstitial markings throughout both lungs, most likely chronic interstitial lung disease, superimposed interstitial edema not excluded. 3. Subcentimeter pulmonary nodule versus pulmonary vessel seen on end,  left mid lung region. Recommend chest CT for further characterization. 4. No evidence of pneumonia. 5. Aortic atherosclerosis. Electronically Signed   By: Franki Cabot M.D.   On: 08/28/2016 20:16  Ct Abdomen Pelvis W Contrast  Result Date: 08/28/2016 CLINICAL DATA:  Sudden abdominal pain and vomiting EXAM: CT ABDOMEN AND PELVIS WITH CONTRAST TECHNIQUE: Multidetector CT imaging of the abdomen and pelvis was performed using the standard protocol following bolus administration of intravenous contrast. CONTRAST:  179mL ISOVUE-300 IOPAMIDOL (ISOVUE-300) INJECTION 61% COMPARISON:  Abdomen radiographs from 04/17/2012 FINDINGS: Lower chest: Mild cardiomegaly. No pericardial effusion. Small hiatal hernia. Atelectasis at each lung base. Faint hypoventilatory change within the lungs accounting for ground-glass opacities. Hepatobiliary: Cholecystectomy. Faint 13 mm right hepatic hypodensity either representing a small cyst or potentially a hemangioma. On repeat delayed imaging through the upper abdomen this finding appears to be smaller and may represent a hemangioma. Minimal intrahepatic ductal dilatation which can be seen in the setting of prior cholecystectomy. Pancreas: Mild fatty replaced appearance of the pancreas. No focal mass lesion. Spleen: No splenomegaly. Three hypodense foci within the spleen ranging in size from 4 mm to 11 mm are identified potentially representing cysts and/or hemangiomas as well. Adrenals/Urinary Tract: Normal bilateral adrenal glands. Interpolar right-sided 13 mm cyst with faint peripheral enhancement medially. Dedicated CT or MRI workup recommended without and with IV contrast. The urinary bladder is unremarkable and nondistended. No nephrolithiasis. No hydronephrosis. Stomach/Bowel: Moderate fluid-filled distention of small bowel. Normal small bowel rotation to the ligament of Treitz. Abnormal dilatation of jejunal loops with fecalized material seen within measuring up to 3.7 cm. Areas  of segmental transmural thickening suspicious for tandem inflammatory strictures are noted contributing to this appearance, example image 40, Series 203 in both lower quadrants. Prominence of the vasa recta noted. These findings may indicate inflammatory bowel disease as the cause of the strictures. The ileum and terminal ileum however are not affected in appearance. Vascular/Lymphatic: Aortoiliac atherosclerosis without aneurysm. No lymphadenopathy. Reproductive: Hysterectomy.  No adnexal mass Other: Small fat containing umbilical hernia. No free air or ascites. Musculoskeletal: No acute osseous abnormality. Mild degenerative disc disease L4-5. Bone islands of both iliac bones, right sacrum and left parasymphysis. IMPRESSION: 1. Fecalized material within dilated loops of jejunum measuring up to 3.7 cm in caliber with segmental tandem areas of apparent inflammatory stricturing noted along it's mid to distal aspect. Findings are suspicious for sequela of inflammatory bowel disease. Given gas and stool within large bowel and nondistended fluid-filled ileum, findings may represent a high grade partial SBO. 2. Hypodensities within the liver and spleen as above described possibly representing hemangiomas and/or cysts. 3. Interpolar 13 mm hypodense renal lesion on the right with subtle peripheral enhancement suggested. Dedicated CT without and with IV contrast or MRI recommended for further workup. 4. Aortoiliac atherosclerosis without aneurysm Electronically Signed   By: Ashley Royalty M.D.   On: 08/28/2016 23:22     Assessment/Plan Principal Problem:   Small bowel obstruction Active Problems:   Essential hypertension   Type 2 diabetes mellitus with neurological manifestations, uncontrolled (HCC)   Obstructive sleep apnea   SBO (small bowel obstruction)    1. Small bowel obstruction - CT scan shows features concerning for strictures. Patient is kept nothing by mouth and on NG tube suction. Continue IV  hydration. Dr. Hulen Skains general surgeon has been consulted. Follow serial KUBs. 2. Hypertension - since patient is nothing by mouth I have placed patient on when necessary IV hydralazine. 3. Diabetes mellitus type 2 - since patient is nothing by mouth I have placed patient on sliding-scale coverage with every 4 CBG checks. Patient usually takes long-acting insulin 22 units in the morning which may be  considered at a lower dose if patient becomes hyperglycemic. 4. COPD - not wheezing at this time. 5. Possible pulmonary nodule - will need CT scan to further assess. Which can be done when patient is stable or as outpatient.   DVT prophylaxis: SCDs. Code Status: Full code.  Family Communication: Patient's husband.  Disposition Plan: Home.  Consults called: General surgery.  Admission status: Inpatient.    Rise Patience MD [facility] Pager [phone].  If 7PM-7AM, please contact night-coverage [email or web address] Password [email or web address]  08/29/2016, 4:28 AM

## 2016-08-29 NOTE — Progress Notes (Signed)
Subjective: No flatus or BM Still with abdominal pain  Objective: Vital signs in last 24 hours: Temp:  [97.6 F (36.4 C)-98.3 F (36.8 C)] 97.6 F (36.4 C) (01/20 0327) Pulse Rate:  [61-94] 79 (01/20 0327) Resp:  [12-27] 18 (01/20 0327) BP: (109-173)/(67-97) 118/67 (01/20 0327) SpO2:  [85 %-98 %] 98 % (01/20 0749) Weight:  [88.1 kg (194 lb 3.6 oz)] 88.1 kg (194 lb 3.6 oz) (01/20 0327) Last BM Date: 08/28/16  Intake/Output from previous day: No intake/output data recorded. Intake/Output this shift: No intake/output data recorded.  GI: soft  diffusely sore  no peritonitis     Lab Results:   Recent Labs  08/28/16 1926 08/29/16 0557  WBC 5.2 8.2  HGB 14.5 14.0  HCT 43.9 42.3  PLT 213 198   BMET  Recent Labs  08/28/16 1926 08/29/16 0557  NA 141 141  K 4.2 4.1  CL 103 107  CO2 26 25  GLUCOSE 129* 116*  BUN 16 19  CREATININE 0.67 0.69  CALCIUM 9.6 9.0   PT/INR No results for input(s): LABPROT, INR in the last 72 hours. ABG No results for input(s): PHART, HCO3 in the last 72 hours.  Invalid input(s): PCO2, PO2  Studies/Results: Dg Chest 2 View  Result Date: 08/28/2016 CLINICAL DATA:  Upper abdominal pain that radiates around both sides that began today. Nausea. EXAM: CHEST  2 VIEW COMPARISON:  Chest x-rays dated 11/06/2014 and 07/11/2009. FINDINGS: Mild cardiomegaly is stable. Atherosclerotic changes again noted at the aortic arch. Coarse interstitial lung markings noted bilaterally suggesting some degree of chronic interstitial lung disease. Superimposed edema not excluded. No confluent opacity to suggest a developing pneumonia. No pleural effusion or pneumothorax seen. Within the left mid lung region, there is a small pulmonary nodule versus pulmonary vessel seen on end. IMPRESSION: 1. Cardiomegaly. 2. Coarse interstitial markings throughout both lungs, most likely chronic interstitial lung disease, superimposed interstitial edema not excluded. 3.  Subcentimeter pulmonary nodule versus pulmonary vessel seen on end, left mid lung region. Recommend chest CT for further characterization. 4. No evidence of pneumonia. 5. Aortic atherosclerosis. Electronically Signed   By: Franki Cabot M.D.   On: 08/28/2016 20:16   Ct Abdomen Pelvis W Contrast  Result Date: 08/28/2016 CLINICAL DATA:  Sudden abdominal pain and vomiting EXAM: CT ABDOMEN AND PELVIS WITH CONTRAST TECHNIQUE: Multidetector CT imaging of the abdomen and pelvis was performed using the standard protocol following bolus administration of intravenous contrast. CONTRAST:  144mL ISOVUE-300 IOPAMIDOL (ISOVUE-300) INJECTION 61% COMPARISON:  Abdomen radiographs from 04/17/2012 FINDINGS: Lower chest: Mild cardiomegaly. No pericardial effusion. Small hiatal hernia. Atelectasis at each lung base. Faint hypoventilatory change within the lungs accounting for ground-glass opacities. Hepatobiliary: Cholecystectomy. Faint 13 mm right hepatic hypodensity either representing a small cyst or potentially a hemangioma. On repeat delayed imaging through the upper abdomen this finding appears to be smaller and may represent a hemangioma. Minimal intrahepatic ductal dilatation which can be seen in the setting of prior cholecystectomy. Pancreas: Mild fatty replaced appearance of the pancreas. No focal mass lesion. Spleen: No splenomegaly. Three hypodense foci within the spleen ranging in size from 4 mm to 11 mm are identified potentially representing cysts and/or hemangiomas as well. Adrenals/Urinary Tract: Normal bilateral adrenal glands. Interpolar right-sided 13 mm cyst with faint peripheral enhancement medially. Dedicated CT or MRI workup recommended without and with IV contrast. The urinary bladder is unremarkable and nondistended. No nephrolithiasis. No hydronephrosis. Stomach/Bowel: Moderate fluid-filled distention of small bowel. Normal small bowel rotation to  the ligament of Treitz. Abnormal dilatation of jejunal  loops with fecalized material seen within measuring up to 3.7 cm. Areas of segmental transmural thickening suspicious for tandem inflammatory strictures are noted contributing to this appearance, example image 40, Series 203 in both lower quadrants. Prominence of the vasa recta noted. These findings may indicate inflammatory bowel disease as the cause of the strictures. The ileum and terminal ileum however are not affected in appearance. Vascular/Lymphatic: Aortoiliac atherosclerosis without aneurysm. No lymphadenopathy. Reproductive: Hysterectomy.  No adnexal mass Other: Small fat containing umbilical hernia. No free air or ascites. Musculoskeletal: No acute osseous abnormality. Mild degenerative disc disease L4-5. Bone islands of both iliac bones, right sacrum and left parasymphysis. IMPRESSION: 1. Fecalized material within dilated loops of jejunum measuring up to 3.7 cm in caliber with segmental tandem areas of apparent inflammatory stricturing noted along it's mid to distal aspect. Findings are suspicious for sequela of inflammatory bowel disease. Given gas and stool within large bowel and nondistended fluid-filled ileum, findings may represent a high grade partial SBO. 2. Hypodensities within the liver and spleen as above described possibly representing hemangiomas and/or cysts. 3. Interpolar 13 mm hypodense renal lesion on the right with subtle peripheral enhancement suggested. Dedicated CT without and with IV contrast or MRI recommended for further workup. 4. Aortoiliac atherosclerosis without aneurysm Electronically Signed   By: Ashley Royalty M.D.   On: 08/28/2016 23:22    Anti-infectives: Anti-infectives    None      Assessment/Plan: Patient Active Problem List   Diagnosis Date Noted  . Small bowel obstruction 08/29/2016  . SBO (small bowel obstruction) 08/29/2016  . Obstructive sleep apnea 03/27/2015  . Bruising 11/16/2013  . Insomnia with sleep apnea 12/07/2012  . Recurrent UTI 11/14/2012   . Type 2 diabetes mellitus with neurological manifestations, uncontrolled ([place]) 06/12/2011  . VITAMIN D DEFICIENCY 08/13/2010  . UNSPECIFIED MYALGIA AND MYOSITIS 08/06/2010  . OSTEOPENIA 08/06/2010  . RETINAL HEMORRHAGE 01/07/2010  . COPD with chronic bronchitis ([place]) 03/29/2009  . PARESTHESIA 03/20/2009  . SPINAL STENOSIS, LUMBAR 11/01/2008  . FATIGUE 10/01/2008  . NIGHT SWEATS 10/01/2008  . ANOREXIA 10/01/2008  . ALLERGIC RHINITIS 01/05/2008  . CONSTIPATION 12/08/2007  . THYROID NODULE 08/31/2007  . Essential hypertension 08/31/2007  . HYPERLIPIDEMIA 08/05/2007  . Dysmetabolic syndrome X [id number]  . PAIN IN JOINT, SITE UNSPECIFIED 08/05/2007  . PALPITATIONS 08/05/2007  . REFLUX ESOPHAGITIS, HX OF 08/05/2007  . RESTLESS LEG SYNDROME 02/28/2007  . IRRITABLE BOWEL SYNDROME 02/28/2007  . NEOP, BNG, PITUITARY GLAND 11/24/2006    Start SB protocol Cont NPO NGT    LOS: 0 days    Cheryl Brown A. 08/29/2016

## 2016-08-30 LAB — GLUCOSE, CAPILLARY
GLUCOSE-CAPILLARY: 95 mg/dL (ref 65–99)
Glucose-Capillary: 189 mg/dL — ABNORMAL HIGH (ref 65–99)
Glucose-Capillary: 70 mg/dL (ref 65–99)
Glucose-Capillary: 95 mg/dL (ref 65–99)
Glucose-Capillary: 96 mg/dL (ref 65–99)

## 2016-08-30 LAB — URINE CULTURE

## 2016-08-30 MED ORDER — HYDROMORPHONE HCL 1 MG/ML IJ SOLN
1.0000 mg | Freq: Once | INTRAMUSCULAR | Status: AC
  Administered 2016-08-30: 1 mg via INTRAVENOUS
  Filled 2016-08-30: qty 1

## 2016-08-30 NOTE — Progress Notes (Signed)
PROGRESS NOTE  [name]  [id number] DOB: March 10, 1936  DOA: 08/28/2016 PCP: Henrine Screws, MD   Brief Narrative:  Cheryl Brown is a 81 y.o. female with history of hypertension diabetes mellitus, COPD who presented with one-day history of sudden onset of abdominal pain with nausea and vomiting. She had not had a bowel movement on the day of admission, her last bowel movement having been that morning before the day of admission. CT scan in the ER showed features concerning for bowel obstruction with strictures. Incidental findings of hypodensities involving the liver, spleen and right kidney suspicious for hemangioma versus cyst were noted.  Chest x-ray showed cardiomegaly, chronic interstitial markings and possible pulmonary nodule.   NG tube was placed and is admitted for small bowel obstruction.   Assessment & Plan:   Principal Problem:   Small bowel obstruction Active Problems:   Essential hypertension   Type 2 diabetes mellitus with neurological manifestations, uncontrolled (HCC)   Obstructive sleep apnea   SBO (small bowel obstruction)  1. Questionable Small bowel obstruction: Supportive care, Antinauseant. Imaging studies not consistent with bowel obstruction and  Gen. Surgery is not sure about the cause of her symptomatology Discontinue NG suction per Surgery Start clears and advance as tolerated. Gen. surgery following, Dr. Hulen Skains  2 diabetes mellitus:  CBG monitoring with insulin as needed-which for hypo/hyper-glycemia.  3. Probable left midlung Pulmonary nodule: Chest CT when stabilized, possibly as outpatient.  4. Hepato-spleno/ renal hypodensities on CT: Incidental finding. Dedicated CT scan with IV contrast versus MRI, when stabilized, possibly as outpatient   DVT prophylaxis: SCD Code Status: Full Code Family Communication: Son and daughter at Bedside Disposition Plan: Home   Consultants:   General Surgery, Dr Hulen Skains  Procedures:   N/A  Antimicrobials:   N/A  Subjective Complains of nausea, no abdominal pain  Objective:  Vitals:   08/29/16 0749 08/29/16 1400 08/29/16 2019 08/30/16 0435  BP:  130/63 (!) 158/71 139/70  Pulse:  74 76 64  Resp:  19 19 19   Temp:  97.8 F (36.6 C) 99.3 F (37.4 C) 98.6 F (37 C)  TempSrc:  Oral Oral Oral  SpO2: 98% 98% 91% 100%  Weight:        Intake/Output Summary (Last 24 hours) at 08/30/16 1147 Last data filed at 08/30/16 1020  Gross per 24 hour  Intake          1646.25 ml  Output              500 ml  Net          1146.25 ml   Filed Weights   08/29/16 0327  Weight: 88.1 kg (194 lb 3.6 oz)    Examination:  General exam: Resting comfortably, no acute distress. Respiratory system: Clear to auscultation. Respiratory effort normal. Cardiovascular system: S1 & S2 heard, RRR. No JVD, murmurs, rubs, gallops or clicks. No pedal edema. Gastrointestinal system: Abdomen is mnimally distended, soft, no tenderness. No organomegaly or masses felt.Slightly  Hypoactive bowel sounds heard. Central nervous system: Alert and oriented. No focal neurological deficits. Extremities: Symmetric 5 x 5 power. Skin: No rashes, lesions or ulcers Psychiatry: Judgement and insight appear normal. Mood & affect appropriate.     Data Reviewed: I have personally reviewed following labs and imaging studies  CBC:  Recent Labs Lab 08/28/16 1926 08/29/16 0557  WBC 5.2 8.2  NEUTROABS  --  6.3  HGB 14.5 14.0  HCT 43.9 42.3  MCV 92.6 92.8  PLT 213  [id number]   Basic Metabolic Panel:  Recent Labs Lab 08/28/16 1926 08/29/16 0557  NA 141 141  K 4.2 4.1  CL 103 107  CO2 26 25  GLUCOSE 129* 116*  BUN 16 19  CREATININE 0.67 0.69  CALCIUM 9.6 9.0   GFR: CrCl cannot be calculated (Unknown ideal weight.). Liver Function Tests:  Recent Labs Lab 08/28/16 1926 08/29/16 0557  AST 24 23  ALT 12* 13*  ALKPHOS 59 54  BILITOT 0.5 0.9  PROT 6.6 6.0*  ALBUMIN 4.0 3.7    Recent Labs Lab  08/28/16 1926  LIPASE 21   No results for input(s): AMMONIA in the last 168 hours. Coagulation Profile: No results for input(s): INR, PROTIME in the last 168 hours. Cardiac Enzymes: No results for input(s): CKTOTAL, CKMB, CKMBINDEX, TROPONINI in the last 168 hours. BNP (last 3 results) No results for input(s): PROBNP in the last 8760 hours. HbA1C: No results for input(s): HGBA1C in the last 72 hours. CBG:  Recent Labs Lab 08/29/16 1632 08/29/16 2012 08/30/16 0025 08/30/16 0430 08/30/16 1115  GLUCAP 79 84 95 95 189*   Lipid Profile: No results for input(s): CHOL, HDL, LDLCALC, TRIG, CHOLHDL, LDLDIRECT in the last 72 hours. Thyroid Function Tests: No results for input(s): TSH, T4TOTAL, FREET4, T3FREE, THYROIDAB in the last 72 hours. Anemia Panel: No results for input(s): VITAMINB12, FOLATE, FERRITIN, TIBC, IRON, RETICCTPCT in the last 72 hours.  Sepsis Labs:  Recent Labs Lab 08/28/16 1926 08/29/16 0557  WBC 5.2 8.2    Recent Results (from the past 240 hour(s))  Urine culture     Status: Abnormal   Collection Time: 08/28/16 11:15 PM  Result Value Ref Range Status   Specimen Description URINE, RANDOM  Final   Special Requests NONE  Final   Culture MULTIPLE SPECIES PRESENT, SUGGEST RECOLLECTION (A)  Final   Report Status 08/30/2016 FINAL  Final         Radiology Studies: Dg Chest 2 View  Result Date: 08/28/2016 CLINICAL DATA:  Upper abdominal pain that radiates around both sides that began today. Nausea. EXAM: CHEST  2 VIEW COMPARISON:  Chest x-rays dated 11/06/2014 and 07/11/2009. FINDINGS: Mild cardiomegaly is stable. Atherosclerotic changes again noted at the aortic arch. Coarse interstitial lung markings noted bilaterally suggesting some degree of chronic interstitial lung disease. Superimposed edema not excluded. No confluent opacity to suggest a developing pneumonia. No pleural effusion or pneumothorax seen. Within the left mid lung region, there is a small  pulmonary nodule versus pulmonary vessel seen on end. IMPRESSION: 1. Cardiomegaly. 2. Coarse interstitial markings throughout both lungs, most likely chronic interstitial lung disease, superimposed interstitial edema not excluded. 3. Subcentimeter pulmonary nodule versus pulmonary vessel seen on end, left mid lung region. Recommend chest CT for further characterization. 4. No evidence of pneumonia. 5. Aortic atherosclerosis. Electronically Signed   By: Franki Cabot M.D.   On: 08/28/2016 20:16   Ct Abdomen Pelvis W Contrast  Result Date: 08/28/2016 CLINICAL DATA:  Sudden abdominal pain and vomiting EXAM: CT ABDOMEN AND PELVIS WITH CONTRAST TECHNIQUE: Multidetector CT imaging of the abdomen and pelvis was performed using the standard protocol following bolus administration of intravenous contrast. CONTRAST:  166mL ISOVUE-300 IOPAMIDOL (ISOVUE-300) INJECTION 61% COMPARISON:  Abdomen radiographs from 04/17/2012 FINDINGS: Lower chest: Mild cardiomegaly. No pericardial effusion. Small hiatal hernia. Atelectasis at each lung base. Faint hypoventilatory change within the lungs accounting for ground-glass opacities. Hepatobiliary: Cholecystectomy. Faint 13 mm right hepatic hypodensity either representing a small cyst or potentially a  hemangioma. On repeat delayed imaging through the upper abdomen this finding appears to be smaller and may represent a hemangioma. Minimal intrahepatic ductal dilatation which can be seen in the setting of prior cholecystectomy. Pancreas: Mild fatty replaced appearance of the pancreas. No focal mass lesion. Spleen: No splenomegaly. Three hypodense foci within the spleen ranging in size from 4 mm to 11 mm are identified potentially representing cysts and/or hemangiomas as well. Adrenals/Urinary Tract: Normal bilateral adrenal glands. Interpolar right-sided 13 mm cyst with faint peripheral enhancement medially. Dedicated CT or MRI workup recommended without and with IV contrast. The urinary  bladder is unremarkable and nondistended. No nephrolithiasis. No hydronephrosis. Stomach/Bowel: Moderate fluid-filled distention of small bowel. Normal small bowel rotation to the ligament of Treitz. Abnormal dilatation of jejunal loops with fecalized material seen within measuring up to 3.7 cm. Areas of segmental transmural thickening suspicious for tandem inflammatory strictures are noted contributing to this appearance, example image 40, Series 203 in both lower quadrants. Prominence of the vasa recta noted. These findings may indicate inflammatory bowel disease as the cause of the strictures. The ileum and terminal ileum however are not affected in appearance. Vascular/Lymphatic: Aortoiliac atherosclerosis without aneurysm. No lymphadenopathy. Reproductive: Hysterectomy.  No adnexal mass Other: Small fat containing umbilical hernia. No free air or ascites. Musculoskeletal: No acute osseous abnormality. Mild degenerative disc disease L4-5. Bone islands of both iliac bones, right sacrum and left parasymphysis. IMPRESSION: 1. Fecalized material within dilated loops of jejunum measuring up to 3.7 cm in caliber with segmental tandem areas of apparent inflammatory stricturing noted along it's mid to distal aspect. Findings are suspicious for sequela of inflammatory bowel disease. Given gas and stool within large bowel and nondistended fluid-filled ileum, findings may represent a high grade partial SBO. 2. Hypodensities within the liver and spleen as above described possibly representing hemangiomas and/or cysts. 3. Interpolar 13 mm hypodense renal lesion on the right with subtle peripheral enhancement suggested. Dedicated CT without and with IV contrast or MRI recommended for further workup. 4. Aortoiliac atherosclerosis without aneurysm Electronically Signed   By: Ashley Royalty M.D.   On: 08/28/2016 23:22   Dg Abd Portable 1v-small Bowel Protocol-position Verification  Result Date: 08/30/2016 CLINICAL DATA:  NG  tube placement EXAM: PORTABLE ABDOMEN - 1 VIEW COMPARISON:  CT 08/28/2016 FINDINGS: Esophageal tube tip overlies the distal stomach. Relative absence of small bowel gas. There is contrast material within the colon and rectum. Surgical clips in the right upper quadrant IMPRESSION: Esophageal tube tip overlies the distal stomach/ proximal duodenum. Contrast is present within nondilated colon and rectum. Electronically Signed   By: Donavan Foil M.D.   On: 08/30/2016 03:49        Scheduled Meds: . fluticasone furoate-vilanterol  1 puff Inhalation Daily  . insulin aspart  0-9 Units Subcutaneous Q4H   Continuous Infusions: . dextrose 5 % and 0.9% NaCl 125 mL/hr at 08/30/16 0055     LOS: 1 day    Time spent: 75    OSEI-BONSU,Sonny Anthes, MD [facility] Pager [phone]  If 7PM-7AM, please contact night-coverage [email or web address] Password [facility] 08/30/2016, 11:47 AM

## 2016-08-30 NOTE — Progress Notes (Signed)
[facility]eah Sk[facility] Progress Note    Subjective: Patient looking very pale and stating that she is terribly nauseated.    Objective: Vital signs in last 24 hours: Temp:  [97.8 F (36.6 C)-99.3 F (37.4 C)] 98.6 F (37 C) (01/21 0435) Pulse Rate:  [64-76] 64 (01/21 0435) Resp:  [19] 19 (01/21 0435) BP: (130-158)/(63-71) 139/70 (01/21 0435) SpO2:  [91 %-100 %] 100 % (01/21 0435) Last BM Date: 08/29/16  Intake/Output from previous day: 01/20 0701 - 01/21 0700 In: 1406.3 [I.V.:1406.3] Out: 500 [Urine:500] Intake/Output this shift: No intake/output data recorded.  General: Nauseated and looking pale. Nothing has been recorded for NGT output since admission  Lungs: Clear to auscultation  Abd: Soft, good bowel sounds..  Not tender at all.  No NGT output recorded  Extremities: No changer  Neuro: Intact  Lab Results:  @LABLAST2 (wbc:2,hgb:2,hct:2,plt:2) BMET ) Recent Labs  08/28/16 1926 08/29/16 0557  NA 141 141  K 4.2 4.1  CL 103 107  CO2 26 25  GLUCOSE 129* 116*  BUN 16 19  CREATININE 0.67 0.69  CALCIUM 9.6 9.0   PT/INR No results for input(s): LABPROT, INR in the last 72 hours. ABG No results for input(s): PHART, HCO3 in the last 72 hours.  Invalid input(s): PCO2, PO2  Studies/Results: Dg Chest 2 View  Result Date: 08/28/2016 CLINICAL DATA:  Upper abdominal pain that radiates around both sides that began today. Nausea. EXAM: CHEST  2 VIEW COMPARISON:  Chest x-rays dated 11/06/2014 and 07/11/2009. FINDINGS: Mild cardiomegaly is stable. Atherosclerotic changes again noted at the aortic arch. Coarse interstitial lung markings noted bilaterally suggesting some degree of chronic interstitial lung disease. Superimposed edema not excluded. No confluent opacity to suggest a developing pneumonia. No pleural effusion or pneumothorax seen. Within the left mid lung region, there is a small pulmonary nodule versus pulmonary vessel seen on end. IMPRESSION: 1. Cardiomegaly. 2. Coarse  interstitial markings throughout both lungs, most likely chronic interstitial lung disease, superimposed interstitial edema not excluded. 3. Subcentimeter pulmonary nodule versus pulmonary vessel seen on end, left mid lung region. Recommend chest CT for further characterization. 4. No evidence of pneumonia. 5. Aortic atherosclerosis. Electronically Signed   By: Franki Cabot M.D.   On: 08/28/2016 20:16   Ct Abdomen Pelvis W Contrast  Result Date: 08/28/2016 CLINICAL DATA:  Sudden abdominal pain and vomiting EXAM: CT ABDOMEN AND PELVIS WITH CONTRAST TECHNIQUE: Multidetector CT imaging of the abdomen and pelvis was performed using the standard protocol following bolus administration of intravenous contrast. CONTRAST:  126mL ISOVUE-300 IOPAMIDOL (ISOVUE-300) INJECTION 61% COMPARISON:  Abdomen radiographs from 04/17/2012 FINDINGS: Lower chest: Mild cardiomegaly. No pericardial effusion. Small hiatal hernia. Atelectasis at each lung base. Faint hypoventilatory change within the lungs accounting for ground-glass opacities. Hepatobiliary: Cholecystectomy. Faint 13 mm right hepatic hypodensity either representing a small cyst or potentially a hemangioma. On repeat delayed imaging through the upper abdomen this finding appears to be smaller and may represent a hemangioma. Minimal intrahepatic ductal dilatation which can be seen in the setting of prior cholecystectomy. Pancreas: Mild fatty replaced appearance of the pancreas. No focal mass lesion. Spleen: No splenomegaly. Three hypodense foci within the spleen ranging in size from 4 mm to 11 mm are identified potentially representing cysts and/or hemangiomas as well. Adrenals/Urinary Tract: Normal bilateral adrenal glands. Interpolar right-sided 13 mm cyst with faint peripheral enhancement medially. Dedicated CT or MRI workup recommended without and with IV contrast. The urinary bladder is unremarkable and nondistended. No nephrolithiasis. No hydronephrosis.  Stomach/Bowel: Moderate fluid-filled  distention of small bowel. Normal small bowel rotation to the ligament of Treitz. Abnormal dilatation of jejunal loops with fecalized material seen within measuring up to 3.7 cm. Areas of segmental transmural thickening suspicious for tandem inflammatory strictures are noted contributing to this appearance, example image 40, Series 203 in both lower quadrants. Prominence of the vasa recta noted. These findings may indicate inflammatory bowel disease as the cause of the strictures. The ileum and terminal ileum however are not affected in appearance. Vascular/Lymphatic: Aortoiliac atherosclerosis without aneurysm. No lymphadenopathy. Reproductive: Hysterectomy.  No adnexal mass Other: Small fat containing umbilical hernia. No free air or ascites. Musculoskeletal: No acute osseous abnormality. Mild degenerative disc disease L4-5. Bone islands of both iliac bones, right sacrum and left parasymphysis. IMPRESSION: 1. Fecalized material within dilated loops of jejunum measuring up to 3.7 cm in caliber with segmental tandem areas of apparent inflammatory stricturing noted along it's mid to distal aspect. Findings are suspicious for sequela of inflammatory bowel disease. Given gas and stool within large bowel and nondistended fluid-filled ileum, findings may represent a high grade partial SBO. 2. Hypodensities within the liver and spleen as above described possibly representing hemangiomas and/or cysts. 3. Interpolar 13 mm hypodense renal lesion on the right with subtle peripheral enhancement suggested. Dedicated CT without and with IV contrast or MRI recommended for further workup. 4. Aortoiliac atherosclerosis without aneurysm Electronically Signed   By: Ashley Royalty M.D.   On: 08/28/2016 23:22   Dg Abd Portable 1v-small Bowel Protocol-position Verification  Result Date: 08/30/2016 CLINICAL DATA:  NG tube placement EXAM: PORTABLE ABDOMEN - 1 VIEW COMPARISON:  CT 08/28/2016 FINDINGS:  Esophageal tube tip overlies the distal stomach. Relative absence of small bowel gas. There is contrast material within the colon and rectum. Surgical clips in the right upper quadrant IMPRESSION: Esophageal tube tip overlies the distal stomach/ proximal duodenum. Contrast is present within nondilated colon and rectum. Electronically Signed   By: Donavan Foil M.D.   On: 08/30/2016 03:49    Anti-infectives: Anti-infectives    None      Assessment/Plan: s/p  Abdominal X-ray shows that the contrast from her CT has passed all the way to her rectum.  Will discontinue NGT and start clear liquids.  Will give some Zofran for nausea.  LOS: 1 day   Kathryne Eriksson. Dahlia Bailiff, MD, FACS [phone] [facility] Surgery 08/30/2016

## 2016-08-31 DIAGNOSIS — I10 Essential (primary) hypertension: Secondary | ICD-10-CM

## 2016-08-31 DIAGNOSIS — J449 Chronic obstructive pulmonary disease, unspecified: Secondary | ICD-10-CM

## 2016-08-31 DIAGNOSIS — K56600 Partial intestinal obstruction, unspecified as to cause: Secondary | ICD-10-CM

## 2016-08-31 DIAGNOSIS — K529 Noninfective gastroenteritis and colitis, unspecified: Secondary | ICD-10-CM

## 2016-08-31 DIAGNOSIS — K219 Gastro-esophageal reflux disease without esophagitis: Secondary | ICD-10-CM | POA: Diagnosis present

## 2016-08-31 LAB — GLUCOSE, CAPILLARY
GLUCOSE-CAPILLARY: 105 mg/dL — AB (ref 65–99)
GLUCOSE-CAPILLARY: 122 mg/dL — AB (ref 65–99)
GLUCOSE-CAPILLARY: 140 mg/dL — AB (ref 65–99)
Glucose-Capillary: 114 mg/dL — ABNORMAL HIGH (ref 65–99)

## 2016-08-31 NOTE — Progress Notes (Signed)
[name] to be D/C'd Home per MD order. Discussed with the patient and all questions fully answered.  Allergies as of 08/31/2016      Reactions   Morphine And Related Shortness Of Breath   Codeine    nausea   Ipratropium-albuterol    REACTION: VERY NERVOUS   Niaspan [niacin]    Abdominal pain   Norvasc [amlodipine Besylate]    10/15/14 headache 08/27/15 abdominal pain, indigestion   Pramipexole Dihydrochloride    Rash & itching   Pregabalin    REACTION: swelling   Propoxyphene N-acetaminophen    REACTION: UPSET STOMACH   Simvastatin    REACTION: Leg cramps   Sulfonamide Derivatives Itching      Medication List    STOP taking these medications   hydrochlorothiazide 25 MG tablet Commonly known as:  HYDRODIURIL     TAKE these medications   BD PEN NEEDLE NANO U/F 32G X 4 MM Misc Generic drug:  Insulin Pen Needle use as directed once daily   BREO ELLIPTA 100-25 MCG/INH Aepb Generic drug:  fluticasone furoate-vilanterol Inhale 1 puff into the lungs daily.   clonazePAM 0.5 MG tablet Commonly known as:  KLONOPIN Take 0.25-0.5 mg by mouth at bedtime as needed (sleep).   Fluticasone-Salmeterol 100-50 MCG/DOSE Aepb Commonly known as:  ADVAIR DISKUS Inhale 1 puff into the lungs every 12 (twelve) hours.   furosemide 20 MG tablet Commonly known as:  LASIX Take 1 tablet (20 mg total) by mouth daily. What changed:  when to take this  reasons to take this   glucose blood test strip Commonly known as:  ONE TOUCH ULTRA TEST 1 each by Other route daily. Use to check blood sugar daily Dx: E11.41   metFORMIN 500 MG tablet Commonly known as:  GLUCOPHAGE Take 500 mg by mouth 2 (two) times daily with a meal.   MIRALAX powder Generic drug:  polyethylene glycol powder Take 34 g by mouth daily. 2 capfuls daily per Urologist   mupirocin ointment 2 % Commonly known as:  BACTROBAN Applied twice a day to the septum;NOT into eyes.   ONETOUCH DELICA LANCETS Misc Check blood  sugar as directed   PREMARIN vaginal cream Generic drug:  conjugated estrogens Place 1 application vaginally every other day.   PRILOSEC PO Take 20 mg by mouth daily.   valsartan 160 MG tablet Commonly known as:  DIOVAN Take 1 tablet (160 mg total) by mouth daily.       VVS, Skin clean, dry and intact without evidence of skin break down, no evidence of skin tears noted.  IV catheter discontinued intact. Site without signs and symptoms of complications. Dressing and pressure applied.  An After Visit Summary was printed and given to the patient.  Patient escorted via [place], and D/C home via private auto.  Cyndra Numbers  08/31/2016 [id number]

## 2016-08-31 NOTE — Discharge Summary (Signed)
Physician Discharge Summary  Cheryl Brown [id number] DOB: [birth year] DOA: 08/28/2016  PCP: Henrine Screws, MD  Admit date: 08/28/2016 Discharge date: 08/31/2016  Admitted From: home Disposition:  Home  Recommendations for Outpatient Follow-up:  1. Follow up with PCP in 1-2 weeks  Home Health:None Equipment/Devices: None  Discharge Condition: Fair CODE STATUS: Full code Diet recommendation: Carb modified    Discharge Diagnoses:  Principal problem Acute enteritis  Active Problems:   Essential hypertension   COPD with chronic bronchitis ([place])   Type 2 diabetes mellitus with neurological manifestations, uncontrolled ([place])   Obstructive sleep apnea   Partial small bowel obstruction   Enteritis   GERD (gastroesophageal reflux disease)   Brief narrative/history of present illness 81 year old female with history of hypertension, diabetes mellitus, COPD (not on home O2) presented with sudden onset of epigastric pain with nausea and vomiting. She did not have any bowel movement on the day of admission. CT scan of the abdomen and pelvis done in the ED was concerning for bowel obstruction with strictures. Also showed incidental finding of hypodensities in the liver, spleen and right kidney (hemangioma versus cyst). Patient had an NG tube placed and admitted to hospitalist service. Surgery consulted.  Hospital course  Principal problem Acute enteritis  Suspect viral enteritis. Received conservative management with IV fluids, nothing by mouth, NG placement and antiemetics. Symptoms have resolved. Surgery consult appreciated.  Possible partial small bowel obstruction CT scan showed an bowel dose with possible strictures. No definite small bowel obstruction was found. Received NG placement with decompression. Follow-up abdominal x-ray Showed masses of contrast into the colon and no further SBO. Diet advanced and patient tolerating well. Suspect her abdominal symptoms are  related to acute enteritis.  Essential hypertension.  Stable. Continue home medications  COPD Continue home inhalers. Will assess for home O2 needs prior to discharge.  Diabetes mellitus type II Stable. Continue metformin.  GERD Continue PPI  Patient stable to be discharged home with outpatient PCP follow-up.  Consults: Surgery   Procedure: CT abdomen and pelvis  Disposition: Home   Discharge Instructions   Allergies as of 08/31/2016      Reactions   Morphine And Related Shortness Of Breath   Codeine    nausea   Ipratropium-albuterol    REACTION: VERY NERVOUS   Niaspan [niacin]    Abdominal pain   Norvasc [amlodipine Besylate]    10/15/14 headache 08/27/15 abdominal pain, indigestion   Pramipexole Dihydrochloride    Rash & itching   Pregabalin    REACTION: swelling   Propoxyphene N-acetaminophen    REACTION: UPSET STOMACH   Simvastatin    REACTION: Leg cramps   Sulfonamide Derivatives Itching      Medication List    STOP taking these medications   hydrochlorothiazide 25 MG tablet Commonly known as:  HYDRODIURIL     TAKE these medications   BD PEN NEEDLE NANO U/F 32G X 4 MM Misc Generic drug:  Insulin Pen Needle use as directed once daily   BREO ELLIPTA 100-25 MCG/INH Aepb Generic drug:  fluticasone furoate-vilanterol Inhale 1 puff into the lungs daily.   clonazePAM 0.5 MG tablet Commonly known as:  KLONOPIN Take 0.25-0.5 mg by mouth at bedtime as needed (sleep).   Fluticasone-Salmeterol 100-50 MCG/DOSE Aepb Commonly known as:  ADVAIR DISKUS Inhale 1 puff into the lungs every 12 (twelve) hours.   furosemide 20 MG tablet Commonly known as:  LASIX Take 1 tablet (20 mg total) by mouth daily. What changed:  when to  take this  reasons to take this   glucose blood test strip Commonly known as:  ONE TOUCH ULTRA TEST 1 each by Other route daily. Use to check blood sugar daily Dx: E11.41   metFORMIN 500 MG tablet Commonly known as:   GLUCOPHAGE Take 500 mg by mouth 2 (two) times daily with a meal.   MIRALAX powder Generic drug:  polyethylene glycol powder Take 34 g by mouth daily. 2 capfuls daily per Urologist   mupirocin ointment 2 % Commonly known as:  BACTROBAN Applied twice a day to the septum;NOT into eyes.   ONETOUCH DELICA LANCETS Misc Check blood sugar as directed   PREMARIN vaginal cream Generic drug:  conjugated estrogens Place 1 application vaginally every other day.   PRILOSEC PO Take 20 mg by mouth daily.   valsartan 160 MG tablet Commonly known as:  DIOVAN Take 1 tablet (160 mg total) by mouth daily.      Follow-up Information    GATES,ROBERT NEVILL, MD. Schedule an appointment as soon as possible for a visit in 1 week(s).   Specialty:  Internal Medicine Contact information: [address] [place] [address] [phone]          Allergies  Allergen Reactions  . Morphine And Related Shortness Of Breath  . Codeine     nausea  . Ipratropium-Albuterol     REACTION: VERY NERVOUS  . Niaspan [Niacin]     Abdominal pain  . Norvasc [Amlodipine Besylate]     10/15/14 headache 08/27/15 abdominal pain, indigestion  . Pramipexole Dihydrochloride     Rash & itching  . Pregabalin     REACTION: swelling  . Propoxyphene N-Acetaminophen     REACTION: UPSET STOMACH  . Simvastatin     REACTION: Leg cramps  . Sulfonamide Derivatives Itching     Procedures/Studies: Dg Chest 2 View  Result Date: 08/28/2016 CLINICAL DATA:  Upper abdominal pain that radiates around both sides that began today. Nausea. EXAM: CHEST  2 VIEW COMPARISON:  Chest x-rays dated 11/06/2014 and 07/11/2009. FINDINGS: Mild cardiomegaly is stable. Atherosclerotic changes again noted at the aortic arch. Coarse interstitial lung markings noted bilaterally suggesting some degree of chronic interstitial lung disease. Superimposed edema not excluded. No confluent opacity to suggest a developing pneumonia. No  pleural effusion or pneumothorax seen. Within the left mid lung region, there is a small pulmonary nodule versus pulmonary vessel seen on end. IMPRESSION: 1. Cardiomegaly. 2. Coarse interstitial markings throughout both lungs, most likely chronic interstitial lung disease, superimposed interstitial edema not excluded. 3. Subcentimeter pulmonary nodule versus pulmonary vessel seen on end, left mid lung region. Recommend chest CT for further characterization. 4. No evidence of pneumonia. 5. Aortic atherosclerosis. Electronically Signed   By: Franki Cabot M.D.   On: 08/28/2016 20:16   Ct Abdomen Pelvis W Contrast  Result Date: 08/28/2016 CLINICAL DATA:  Sudden abdominal pain and vomiting EXAM: CT ABDOMEN AND PELVIS WITH CONTRAST TECHNIQUE: Multidetector CT imaging of the abdomen and pelvis was performed using the standard protocol following bolus administration of intravenous contrast. CONTRAST:  153mL ISOVUE-300 IOPAMIDOL (ISOVUE-300) INJECTION 61% COMPARISON:  Abdomen radiographs from 04/17/2012 FINDINGS: Lower chest: Mild cardiomegaly. No pericardial effusion. Small hiatal hernia. Atelectasis at each lung base. Faint hypoventilatory change within the lungs accounting for ground-glass opacities. Hepatobiliary: Cholecystectomy. Faint 13 mm right hepatic hypodensity either representing a small cyst or potentially a hemangioma. On repeat delayed imaging through the upper abdomen this finding appears to be smaller and may  represent a hemangioma. Minimal intrahepatic ductal dilatation which can be seen in the setting of prior cholecystectomy. Pancreas: Mild fatty replaced appearance of the pancreas. No focal mass lesion. Spleen: No splenomegaly. Three hypodense foci within the spleen ranging in size from 4 mm to 11 mm are identified potentially representing cysts and/or hemangiomas as well. Adrenals/Urinary Tract: Normal bilateral adrenal glands. Interpolar right-sided 13 mm cyst with faint peripheral enhancement  medially. Dedicated CT or MRI workup recommended without and with IV contrast. The urinary bladder is unremarkable and nondistended. No nephrolithiasis. No hydronephrosis. Stomach/Bowel: Moderate fluid-filled distention of small bowel. Normal small bowel rotation to the ligament of Treitz. Abnormal dilatation of jejunal loops with fecalized material seen within measuring up to 3.7 cm. Areas of segmental transmural thickening suspicious for tandem inflammatory strictures are noted contributing to this appearance, example image 40, Series 203 in both lower quadrants. Prominence of the vasa recta noted. These findings may indicate inflammatory bowel disease as the cause of the strictures. The ileum and terminal ileum however are not affected in appearance. Vascular/Lymphatic: Aortoiliac atherosclerosis without aneurysm. No lymphadenopathy. Reproductive: Hysterectomy.  No adnexal mass Other: Small fat containing umbilical hernia. No free air or ascites. Musculoskeletal: No acute osseous abnormality. Mild degenerative disc disease L4-5. Bone islands of both iliac bones, right sacrum and left parasymphysis. IMPRESSION: 1. Fecalized material within dilated loops of jejunum measuring up to 3.7 cm in caliber with segmental tandem areas of apparent inflammatory stricturing noted along it's mid to distal aspect. Findings are suspicious for sequela of inflammatory bowel disease. Given gas and stool within large bowel and nondistended fluid-filled ileum, findings may represent a high grade partial SBO. 2. Hypodensities within the liver and spleen as above described possibly representing hemangiomas and/or cysts. 3. Interpolar 13 mm hypodense renal lesion on the right with subtle peripheral enhancement suggested. Dedicated CT without and with IV contrast or MRI recommended for further workup. 4. Aortoiliac atherosclerosis without aneurysm Electronically Signed   By: Ashley Royalty M.D.   On: 08/28/2016 23:22   Dg Abd Portable  1v-small Bowel Protocol-position Verification  Result Date: 08/30/2016 CLINICAL DATA:  NG tube placement EXAM: PORTABLE ABDOMEN - 1 VIEW COMPARISON:  CT 08/28/2016 FINDINGS: Esophageal tube tip overlies the distal stomach. Relative absence of small bowel gas. There is contrast material within the colon and rectum. Surgical clips in the right upper quadrant IMPRESSION: Esophageal tube tip overlies the distal stomach/ proximal duodenum. Contrast is present within nondilated colon and rectum. Electronically Signed   By: Donavan Foil M.D.   On: 08/30/2016 03:49       Subjective: To further abdominal pain, nausea or vomiting. Tolerating clears  Discharge Exam: Vitals:   08/30/16 2220 08/31/16 0543  BP: (!) 131/56 (!) 126/59  Pulse: 60 (!) 54  Resp: 18 18  Temp: 97.9 F (36.6 C) 97.8 F (36.6 C)   Vitals:   08/30/16 1441 08/30/16 2220 08/31/16 0543 08/31/16 0905  BP: 97/70 (!) 131/56 (!) 126/59   Pulse: 61 60 (!) 54   Resp: 18 18 18    Temp: 98.1 F (36.7 C) 97.9 F (36.6 C) 97.8 F (36.6 C)   TempSrc: Oral Oral Oral   SpO2: 92% 90% 97% (!) 86%  Weight:        General:Elderly female not in distress HEENT: Moist mucosa, supple neck Chest: Clear bilaterally CVS: Normal S1 and S2, no murmurs GI: Soft, nondistended, nontender, bowel sounds present Musculoskeletal: Warm, no edema      The results of  significant diagnostics from this hospitalization (including imaging, microbiology, ancillary and laboratory) are listed below for reference.     Microbiology: Recent Results (from the past 240 hour(s))  Urine culture     Status: Abnormal   Collection Time: 08/28/16 11:15 PM  Result Value Ref Range Status   Specimen Description URINE, RANDOM  Final   Special Requests NONE  Final   Culture MULTIPLE SPECIES PRESENT, SUGGEST RECOLLECTION (A)  Final   Report Status 08/30/2016 FINAL  Final     Labs: BNP (last 3 results)  Recent Labs  08/28/16 1926  BNP [id number]   Basic  Metabolic Panel:  Recent Labs Lab 08/28/16 1926 08/29/16 0557  NA 141 141  K 4.2 4.1  CL 103 107  CO2 26 25  GLUCOSE 129* 116*  BUN 16 19  CREATININE 0.67 0.69  CALCIUM 9.6 9.0   Liver Function Tests:  Recent Labs Lab 08/28/16 1926 08/29/16 0557  AST 24 23  ALT 12* 13*  ALKPHOS 59 54  BILITOT 0.5 0.9  PROT 6.6 6.0*  ALBUMIN 4.0 3.7    Recent Labs Lab 08/28/16 1926  LIPASE 21   No results for input(s): AMMONIA in the last 168 hours. CBC:  Recent Labs Lab 08/28/16 1926 08/29/16 0557  WBC 5.2 8.2  NEUTROABS  --  6.3  HGB 14.5 14.0  HCT 43.9 42.3  MCV 92.6 92.8  PLT 213 198   Cardiac Enzymes: No results for input(s): CKTOTAL, CKMB, CKMBINDEX, TROPONINI in the last 168 hours. BNP: Invalid input(s): POCBNP CBG:  Recent Labs Lab 08/30/16 1622 08/30/16 2155 08/31/16 0005 08/31/16 0411 08/31/16 0825  GLUCAP 70 96 105* 122* 114*   D-Dimer No results for input(s): DDIMER in the last 72 hours. Hgb A1c No results for input(s): HGBA1C in the last 72 hours. Lipid Profile No results for input(s): CHOL, HDL, LDLCALC, TRIG, CHOLHDL, LDLDIRECT in the last 72 hours. Thyroid function studies No results for input(s): TSH, T4TOTAL, T3FREE, THYROIDAB in the last 72 hours.  Invalid input(s): FREET3 Anemia work up No results for input(s): VITAMINB12, FOLATE, FERRITIN, TIBC, IRON, RETICCTPCT in the last 72 hours. Urinalysis    Component Value Date/Time   COLORURINE YELLOW 08/28/2016 2315   APPEARANCEUR CLEAR 08/28/2016 2315   LABSPEC >1.046 (H) 08/28/2016 2315   PHURINE 5.0 08/28/2016 2315   GLUCOSEU NEGATIVE 08/28/2016 2315   HGBUR NEGATIVE 08/28/2016 2315   HGBUR negative 01/05/2008 1445   BILIRUBINUR NEGATIVE 08/28/2016 2315   BILIRUBINUR Neg 11/14/2012 1144   KETONESUR 5 (A) 08/28/2016 2315   PROTEINUR NEGATIVE 08/28/2016 2315   UROBILINOGEN 0.2 11/14/2012 1144   UROBILINOGEN 0.2 04/17/2012 1601   NITRITE NEGATIVE 08/28/2016 2315   LEUKOCYTESUR  NEGATIVE 08/28/2016 2315   Sepsis Labs Invalid input(s): PROCALCITONIN,  WBC,  LACTICIDVEN Microbiology Recent Results (from the past 240 hour(s))  Urine culture     Status: Abnormal   Collection Time: 08/28/16 11:15 PM  Result Value Ref Range Status   Specimen Description URINE, RANDOM  Final   Special Requests NONE  Final   Culture MULTIPLE SPECIES PRESENT, SUGGEST RECOLLECTION (A)  Final   Report Status 08/30/2016 FINAL  Final     Time coordinating discharge: < 30 minutes  SIGNED:   Louellen Molder, MD  [facility] 08/31/2016, 11:27 AM Pager   If 7PM-7AM, please contact night-coverage [email or web address] Password [email or web address]

## 2016-08-31 NOTE — Progress Notes (Signed)
  Subjective: Pt doing well with no c/o  Tol CLD Having BMs  Objective: Vital signs in last 24 hours: Temp:  [97.8 F (36.6 C)-98.1 F (36.7 C)] 97.8 F (36.6 C) (01/22 0543) Pulse Rate:  [54-61] 54 (01/22 0543) Resp:  [18] 18 (01/22 0543) BP: (97-131)/(56-70) 126/59 (01/22 0543) SpO2:  [90 %-97 %] 97 % (01/22 0543) Last BM Date: 08/29/16  Intake/Output from previous day: 01/21 0701 - 01/22 0700 In: 240 [P.O.:240] Out: -  Intake/Output this shift: No intake/output data recorded.  General appearance: alert and cooperative GI: soft, non-tender; bowel sounds normal; no masses,  no organomegaly  Lab Results:   Recent Labs  08/28/16 1926 08/29/16 0557  WBC 5.2 8.2  HGB 14.5 14.0  HCT 43.9 42.3  PLT 213 198   BMET  Recent Labs  08/28/16 1926 08/29/16 0557  NA 141 141  K 4.2 4.1  CL 103 107  CO2 26 25  GLUCOSE 129* 116*  BUN 16 19  CREATININE 0.67 0.69  CALCIUM 9.6 9.0   PT/INR No results for input(s): LABPROT, INR in the last 72 hours. ABG No results for input(s): PHART, HCO3 in the last 72 hours.  Invalid input(s): PCO2, PO2  Studies/Results: Dg Abd Portable 1v-small Bowel Protocol-position Verification  Result Date: 08/30/2016 CLINICAL DATA:  NG tube placement EXAM: PORTABLE ABDOMEN - 1 VIEW COMPARISON:  CT 08/28/2016 FINDINGS: Esophageal tube tip overlies the distal stomach. Relative absence of small bowel gas. There is contrast material within the colon and rectum. Surgical clips in the right upper quadrant IMPRESSION: Esophageal tube tip overlies the distal stomach/ proximal duodenum. Contrast is present within nondilated colon and rectum. Electronically Signed   By: Donavan Foil M.D.   On: 08/30/2016 03:49    Anti-infectives: Anti-infectives    None      Assessment/Plan: 81 y/o F with ?SBO resolving vs. Enteritis 1. Adv diet to soft and as tol 2. If tol PO soft diet today would be OK if [place] home 3. No need for Surgery f/u   LOS: 2 days     Rosario Jacks., [facility] 08/31/2016

## 2016-09-01 NOTE — Consult Note (Signed)
            [facility] CM Primary Care Navigator  09/01/2016  Cheryl Brown 02/22/1936 [id number]     Went to see patient at the bedside to identify possible discharge needs but staff states she was already discharged to home.  Primary care provider's office called (Aimee) to notify of patient's discharge and need for post hospital follow-up and transition of care.  Made aware to refer patient to [facility] care management for care coordination needs if deemed appropriate for services.   For additional questions please contact:  Edwena Felty A. Zakery Normington, BSN, RN-BC [facility] PRIMARY CARE Navigator Cell: [phone]

## 2016-09-02 ENCOUNTER — Ambulatory Visit (INDEPENDENT_AMBULATORY_CARE_PROVIDER_SITE_OTHER): Payer: PPO | Admitting: Cardiovascular Disease

## 2016-09-02 ENCOUNTER — Encounter: Payer: Self-pay | Admitting: Cardiovascular Disease

## 2016-09-02 ENCOUNTER — Encounter (INDEPENDENT_AMBULATORY_CARE_PROVIDER_SITE_OTHER): Payer: Self-pay

## 2016-09-02 VITALS — BP 136/84 | HR 60 | Ht 63.0 in | Wt 197.0 lb

## 2016-09-02 DIAGNOSIS — I10 Essential (primary) hypertension: Secondary | ICD-10-CM

## 2016-09-02 DIAGNOSIS — R0602 Shortness of breath: Secondary | ICD-10-CM

## 2016-09-02 NOTE — Patient Instructions (Signed)
Medication Instructions:  Your physician recommends that you continue on your current medications as directed. Please refer to the Current Medication list given to you today.  Labwork: No new orders.   Testing/Procedures: Your physician has requested that you have a lexiscan myoview. For further information please visit [email or web address]. Please follow instruction sheet, as given.  Follow-Up: Your physician wants you to follow-up in: 1 YEAR with Dr Cooper.  You will receive a reminder letter in the mail two months in advance. If you don't receive a letter, please call our office to schedule the follow-up appointment.   Any Other Special Instructions Will Be Listed Below (If Applicable).     If you need a refill on your cardiac medications before your next appointment, please call your pharmacy.   

## 2016-09-02 NOTE — Progress Notes (Signed)
Cardiology Office Note Date:  09/02/2016   ID:  [name], DOB 1935/10/25, MRN [id number]  PCP:  Henrine Screws, MD  Cardiologist:  Sherren Mocha, MD    Chief Complaint  Patient presents with  . Shortness of Breath   History of Present Illness: Cheryl Brown is a 81 y.o. female who presents for follow-up of shortness of breath, hypertension, hyperlipidemia, and type 2 diabetes. The patient had an echocardiogram last year demonstrating normal LV function with grade 1 diastolic dysfunction and no significant valvular pathology. This was done to assess her shortness of breath which is long-standing. She has primarily been limited by leg and foot problems. She has complained of leg weakness and neuropathic type pains. She's undergone bilateral knee replacement.  The patient was just hospitalized earlier this week with gastroenteritis and possible partial small bowel obstruction. She responded well to conservative therapy.  She continues to have problems with her breathing. She is short of breath with low-level activity. States that she has to sit up at nighttime in order to breathe. Occasionally has pressure in her chest associated with activity and states this only occurs when she become short of breath. She denies heart palpitations, lightheadedness, or syncope. She denies leg swelling.   Past Medical History:  Diagnosis Date  . Blood transfusion   . COPD (chronic obstructive pulmonary disease) ([place])   . Diabetes mellitus   . Dysmetabolic syndrome   . Hyperlipemia   . Hypertension   . IBS (irritable bowel syndrome)   . Neoplasm of pituitary gland    Dr.Nudelman   . Palpitations   . Pituitary adenoma ([place]) 1991   Dr.Love and Dr.Nudelman   . Recurrent UTI 2013-14   Dr. Thomasene Mohair and Dr. Marvel Plan  . Reflux esophagitis   . Restless leg syndrome   . Scoliosis    Spinal Stenosis  . Shingles outbreak     Past Surgical History:  Procedure Laterality Date  . ANKLE  FRACTURE SURGERY     left ankle  . BREAST LUMPECTOMY     4 lumps removed from breast, all benign   . COLONOSCOPY  2014   Tics, [facility]  . CYSTOSCOPY  2014    Dr Thomasene Mohair  . ESOPHAGEAL DILATION  01/2003  . FINGER AMPUTATION     related to work injury   . g2 p2    . KNEE SURGERY  [id number]    complicated by difficult resuscitation post anesthesia   . LAPAROSCOPIC CHOLECYSTECTOMY  03/2011   [facility] [place]     for dysfunctional bleeding   . pituitary adenoma     Dr. Erling Cruz  . UPPER GI ENDOSCOPY  2014   gastric polyp  . URETHRAL DILATION  2014    Current Outpatient Prescriptions  Medication Sig Dispense Refill  . BD PEN NEEDLE NANO U/F 32G X 4 MM MISC use as directed once daily 100 each 10  . clonazePAM (KLONOPIN) 0.5 MG tablet Take 0.25-0.5 mg by mouth at bedtime as needed (sleep).    . fluticasone furoate-vilanterol (BREO ELLIPTA) 100-25 MCG/INH AEPB Inhale 1 puff into the lungs daily.    . furosemide (LASIX) 20 MG tablet Take 1 tablet (20 mg total) by mouth daily. (Patient taking differently: Take 20 mg by mouth daily as needed for fluid. ) 30 tablet 6  . glucose blood (ONE TOUCH ULTRA TEST) test strip 1 each by Other route daily. Use to check blood sugar daily Dx: E11.41 90 each  3  . Insulin Glargine (TOUJEO SOLOSTAR [place]) Inject 22 Units into the skin every morning.    . metFORMIN (GLUCOPHAGE) 500 MG tablet Take 500 mg by mouth 2 (two) times daily with a meal.    . ONETOUCH DELICA LANCETS MISC Check blood sugar as directed 100 each 3  . polyethylene glycol powder (MIRALAX) powder Take 34 g by mouth daily. 2 capfuls daily per Urologist     . PREMARIN vaginal cream Place 1 application vaginally every other day.    . valsartan (DIOVAN) 160 MG tablet Take 1 tablet (160 mg total) by mouth daily. 90 tablet 3   No current facility-administered medications for this visit.     Allergies:   Morphine and related; Codeine; Ipratropium-albuterol; Niaspan  [niacin]; Norvasc [amlodipine besylate]; Pramipexole dihydrochloride; Pregabalin; Propoxyphene n-acetaminophen; Simvastatin; and Sulfonamide derivatives   Social History:  The patient  reports that she quit smoking about 38 years ago. She has never used smokeless tobacco. She reports that she does not drink alcohol or use drugs.   Family History:  The patient's  family history includes Alzheimer's disease in her maternal aunt, mother, and sister; Arthritis in her mother; Bone cancer in her father; Cancer in her father; Coronary artery disease in her father; Diabetes in her father and sister; Hypertension in her sister; Mental illness in her maternal aunt, maternal grandmother, and mother; Mitral valve prolapse in her sister; Osteoporosis in her sister; Prostate cancer in her father; Stroke in her paternal grandmother.    ROS:  Please see the history of present illness.  Otherwise, review of systems is positive for Weight gain, chills, visual disturbance, abdominal pain, back pain, dizziness, easy bruising, snoring, wheezing, constipation, headaches, nausea, vomiting.  All other systems are reviewed and negative.   PHYSICAL EXAM: VS:  BP 136/84   Pulse 60   Ht 5\' 3"  (1.6 m)   Wt 197 lb (89.4 kg)   BMI 34.90 kg/m  , BMI Body mass index is 34.9 kg/m. GEN: pleasant obese woman in no acute distress  HEENT: normal  Neck: no JVD, no masses. No carotid bruits Cardiac: RRR without murmur or gallop                Respiratory:  clear to auscultation bilaterally, normal work of breathing GI: soft, nontender, nondistended, + BS MS: no deformity or atrophy  Ext: no pretibial edema, pedal pulses 2+= bilaterally Skin: warm and dry, no rash Neuro:  Strength and sensation are intact Psych: euthymic mood, full affect  EKG:  EKG is not ordered today.  Recent Labs: 08/28/2016: B Natriuretic Peptide 23.8 08/29/2016: ALT 13; BUN 19; Creatinine, Ser 0.69; Hemoglobin 14.0; Platelets 198; Potassium 4.1; Sodium  141   Lipid Panel     Component Value Date/Time   CHOL 196 05/28/2015 1416   TRIG 293.0 (H) 05/28/2015 1416   HDL 47.50 05/28/2015 1416   CHOLHDL 4 05/28/2015 1416   VLDL 58.6 (H) 05/28/2015 1416   LDLCALC 132 (H) 11/16/2013 1418   LDLDIRECT 122.0 05/28/2015 1416      Wt Readings from Last 3 Encounters:  09/02/16 197 lb (89.4 kg)  08/29/16 194 lb 3.6 oz (88.1 kg)  01/27/16 191 lb 3.2 oz (86.7 kg)     Cardiac Studies Reviewed: 2D Echo 08-13-2015: Left ventricle:  The cavity size was normal. Wall thickness was increased in a pattern of mild LVH. Systolic function was normal. The estimated ejection fraction was in the range of 55% to 60%. Wall motion was  normal; there were no regional wall motion abnormalities. Doppler parameters are consistent with abnormal left ventricular relaxation (grade 1 diastolic dysfunction).  ------------------------------------------------------------------- Aortic valve:   Trileaflet; normal thickness, mildly calcified leaflets. Mobility was not restricted.  Doppler:  Transvalvular velocity was within the normal range. There was no stenosis. There was no regurgitation.  ------------------------------------------------------------------- Aorta:  Aortic root: The aortic root was normal in size.  ------------------------------------------------------------------- Mitral valve:   Mildly thickened leaflets . Mobility was not restricted.  Doppler:  Transvalvular velocity was within the normal range. There was no evidence for stenosis. There was mild regurgitation.  ------------------------------------------------------------------- Left atrium:  The atrium was mildly dilated.  ------------------------------------------------------------------- Atrial septum:  No defect or patent foramen ovale was identified.   ------------------------------------------------------------------- Right ventricle:  The cavity size was normal. Wall thickness  was normal. Systolic function was normal.  ------------------------------------------------------------------- Pulmonic valve:    Doppler:  Transvalvular velocity was within the normal range. There was no evidence for stenosis. There was trivial regurgitation.  ------------------------------------------------------------------- Tricuspid valve:   Structurally normal valve.    Doppler: Transvalvular velocity was within the normal range. There was mild regurgitation.  ------------------------------------------------------------------- Pulmonary artery:   The main pulmonary artery was normal-sized. Systolic pressure was within the normal range.  ------------------------------------------------------------------- Right atrium:  The atrium was normal in size.  ------------------------------------------------------------------- Pericardium:  The pericardium was normal in appearance. There was no pericardial effusion.  ------------------------------------------------------------------- Systemic veins: Inferior vena cava: The vessel was normal in size.   ASSESSMENT AND PLAN: 1.  Shortness of breath: Suspect multifactorial with obesity, deconditioning, and diastolic dysfunction. However, she is certainly at risk for coronary artery disease considering her age and diabetes. She has never had an ischemic evaluation. I've recommended a Lexiscan Myoview stress test for risk stratification. I reviewed records from her recent hospitalization and her BNP was normal. Troponin was also within normal limits. There were no EKG changes appreciated on her 12-lead EKG from the hospital.  2. Essential hypertension: Pressure appears to be controlled on her current medical program which includes valsartan.  3. Hyperlipidemia: The patient is intolerant to statin drugs.  4. Type 2 diabetes: Her medical regimen is reviewed. She is followed by her primary care physician.  Current medicines are  reviewed with the patient today.  The patient does not have concerns regarding medicines.  Labs/ tests ordered today include:   Orders Placed This Encounter  Procedures  . Myocardial Perfusion Imaging   Disposition:   FU one year  Signed, Sherren Mocha, MD  09/02/2016 1:10 PM    [address], [place], [place]  [address] Phone: [phone]; Fax: [phone]

## 2016-09-07 ENCOUNTER — Ambulatory Visit: Payer: PPO

## 2016-09-07 DIAGNOSIS — E1140 Type 2 diabetes mellitus with diabetic neuropathy, unspecified: Secondary | ICD-10-CM | POA: Diagnosis not present

## 2016-09-07 DIAGNOSIS — R0601 Orthopnea: Secondary | ICD-10-CM | POA: Diagnosis not present

## 2016-09-07 DIAGNOSIS — G2581 Restless legs syndrome: Secondary | ICD-10-CM | POA: Diagnosis not present

## 2016-09-07 DIAGNOSIS — K56609 Unspecified intestinal obstruction, unspecified as to partial versus complete obstruction: Secondary | ICD-10-CM | POA: Diagnosis not present

## 2016-09-07 DIAGNOSIS — R600 Localized edema: Secondary | ICD-10-CM | POA: Diagnosis not present

## 2016-09-22 ENCOUNTER — Telehealth (HOSPITAL_COMMUNITY): Payer: Self-pay | Admitting: *Deleted

## 2016-09-22 DIAGNOSIS — D487 Neoplasm of uncertain behavior of other specified sites: Secondary | ICD-10-CM | POA: Diagnosis not present

## 2016-09-22 DIAGNOSIS — C44622 Squamous cell carcinoma of skin of right upper limb, including shoulder: Secondary | ICD-10-CM | POA: Diagnosis not present

## 2016-09-22 DIAGNOSIS — L820 Inflamed seborrheic keratosis: Secondary | ICD-10-CM | POA: Diagnosis not present

## 2016-09-22 NOTE — Telephone Encounter (Signed)
Left message on voicemail per DPR in reference to upcoming appointment scheduled on 09/24/16 at 1000 with detailed instructions given per Myocardial Perfusion Study Information Sheet for the test. LM to arrive 15 minutes early, and that it is imperative to arrive on time for appointment to keep from having the test rescheduled. If you need to cancel or reschedule your appointment, please call the office within 24 hours of your appointment. Failure to do so may result in a cancellation of your appointment, and a $50 no show fee. Phone number given for call back for any questions.

## 2016-09-24 ENCOUNTER — Ambulatory Visit (HOSPITAL_COMMUNITY): Payer: PPO | Attending: Cardiovascular Disease

## 2016-09-24 DIAGNOSIS — I10 Essential (primary) hypertension: Secondary | ICD-10-CM

## 2016-09-24 DIAGNOSIS — R0602 Shortness of breath: Secondary | ICD-10-CM

## 2016-09-24 LAB — MYOCARDIAL PERFUSION IMAGING
CHL CUP NUCLEAR SSS: 5
LV sys vol: 29 mL
LVDIAVOL: 72 mL (ref 46–106)
NUC STRESS TID: 0.99
Peak HR: 81 {beats}/min
RATE: 0.3
Rest HR: 56 {beats}/min
SDS: 1
SRS: 4

## 2016-09-24 MED ORDER — TECHNETIUM TC 99M TETROFOSMIN IV KIT
31.9000 | PACK | Freq: Once | INTRAVENOUS | Status: AC | PRN
  Administered 2016-09-24: 31.9 via INTRAVENOUS
  Filled 2016-09-24: qty 32

## 2016-09-24 MED ORDER — TECHNETIUM TC 99M TETROFOSMIN IV KIT
10.9000 | PACK | Freq: Once | INTRAVENOUS | Status: AC | PRN
  Administered 2016-09-24: 10.9 via INTRAVENOUS
  Filled 2016-09-24: qty 11

## 2016-09-24 MED ORDER — REGADENOSON 0.4 MG/5ML IV SOLN
0.4000 mg | Freq: Once | INTRAVENOUS | Status: AC
  Administered 2016-09-24: 0.4 mg via INTRAVENOUS

## 2016-09-28 ENCOUNTER — Ambulatory Visit
Admission: RE | Admit: 2016-09-28 | Discharge: 2016-09-28 | Disposition: A | Discharge status: Home / Self Care | Payer: PPO | Source: Ambulatory Visit | Attending: Obstetrics and Gynecology | Admitting: Obstetrics and Gynecology

## 2016-09-28 DIAGNOSIS — Z1231 Encounter for screening mammogram for malignant neoplasm of breast: Secondary | ICD-10-CM | POA: Diagnosis not present

## 2016-09-29 ENCOUNTER — Encounter: Payer: Self-pay | Admitting: Cardiovascular Disease

## 2016-09-29 NOTE — Telephone Encounter (Signed)
This encounter was created in error - please disregard.

## 2016-09-29 NOTE — Telephone Encounter (Signed)
New message      Returning a call to get lab results.  If tomorrow, please call before noon on Wednesday the 21st

## 2016-10-05 DIAGNOSIS — E1140 Type 2 diabetes mellitus with diabetic neuropathy, unspecified: Secondary | ICD-10-CM | POA: Diagnosis not present

## 2016-10-05 DIAGNOSIS — M189 Osteoarthritis of first carpometacarpal joint, unspecified: Secondary | ICD-10-CM | POA: Diagnosis not present

## 2016-10-05 DIAGNOSIS — Z794 Long term (current) use of insulin: Secondary | ICD-10-CM | POA: Diagnosis not present

## 2016-10-05 DIAGNOSIS — I10 Essential (primary) hypertension: Secondary | ICD-10-CM | POA: Diagnosis not present

## 2016-10-05 DIAGNOSIS — J449 Chronic obstructive pulmonary disease, unspecified: Secondary | ICD-10-CM | POA: Diagnosis not present

## 2016-10-05 DIAGNOSIS — R600 Localized edema: Secondary | ICD-10-CM | POA: Diagnosis not present

## 2016-10-05 DIAGNOSIS — R0601 Orthopnea: Secondary | ICD-10-CM | POA: Diagnosis not present

## 2016-10-05 DIAGNOSIS — E119 Type 2 diabetes mellitus without complications: Secondary | ICD-10-CM | POA: Diagnosis not present

## 2016-10-05 DIAGNOSIS — G2581 Restless legs syndrome: Secondary | ICD-10-CM | POA: Diagnosis not present

## 2016-10-21 DIAGNOSIS — D485 Neoplasm of uncertain behavior of skin: Secondary | ICD-10-CM | POA: Diagnosis not present

## 2016-10-28 ENCOUNTER — Other Ambulatory Visit: Payer: Self-pay | Admitting: Cardiovascular Disease

## 2017-01-20 DIAGNOSIS — G2581 Restless legs syndrome: Secondary | ICD-10-CM | POA: Diagnosis not present

## 2017-01-20 DIAGNOSIS — J449 Chronic obstructive pulmonary disease, unspecified: Secondary | ICD-10-CM | POA: Diagnosis not present

## 2017-01-20 DIAGNOSIS — R21 Rash and other nonspecific skin eruption: Secondary | ICD-10-CM | POA: Diagnosis not present

## 2017-01-20 DIAGNOSIS — E119 Type 2 diabetes mellitus without complications: Secondary | ICD-10-CM | POA: Diagnosis not present

## 2017-01-20 DIAGNOSIS — G253 Myoclonus: Secondary | ICD-10-CM | POA: Diagnosis not present

## 2017-01-20 DIAGNOSIS — I10 Essential (primary) hypertension: Secondary | ICD-10-CM | POA: Diagnosis not present

## 2017-01-27 DIAGNOSIS — H35371 Puckering of macula, right eye: Secondary | ICD-10-CM | POA: Diagnosis not present

## 2017-01-27 DIAGNOSIS — H35342 Macular cyst, hole, or pseudohole, left eye: Secondary | ICD-10-CM | POA: Diagnosis not present

## 2017-01-27 DIAGNOSIS — H43811 Vitreous degeneration, right eye: Secondary | ICD-10-CM | POA: Diagnosis not present

## 2017-01-27 DIAGNOSIS — H4322 Crystalline deposits in vitreous body, left eye: Secondary | ICD-10-CM | POA: Diagnosis not present

## 2017-02-19 DIAGNOSIS — H26491 Other secondary cataract, right eye: Secondary | ICD-10-CM | POA: Diagnosis not present

## 2017-02-19 DIAGNOSIS — H35372 Puckering of macula, left eye: Secondary | ICD-10-CM | POA: Diagnosis not present

## 2017-02-19 DIAGNOSIS — H43822 Vitreomacular adhesion, left eye: Secondary | ICD-10-CM | POA: Diagnosis not present

## 2017-02-19 DIAGNOSIS — H02839 Dermatochalasis of unspecified eye, unspecified eyelid: Secondary | ICD-10-CM | POA: Diagnosis not present

## 2017-03-26 DIAGNOSIS — G2581 Restless legs syndrome: Secondary | ICD-10-CM | POA: Diagnosis not present

## 2017-03-26 DIAGNOSIS — E1140 Type 2 diabetes mellitus with diabetic neuropathy, unspecified: Secondary | ICD-10-CM | POA: Diagnosis not present

## 2017-03-26 DIAGNOSIS — E119 Type 2 diabetes mellitus without complications: Secondary | ICD-10-CM | POA: Diagnosis not present

## 2017-04-05 ENCOUNTER — Encounter: Payer: Self-pay | Admitting: Neurology

## 2017-04-05 ENCOUNTER — Ambulatory Visit (INDEPENDENT_AMBULATORY_CARE_PROVIDER_SITE_OTHER): Payer: PPO | Admitting: Neurology

## 2017-04-05 ENCOUNTER — Encounter (INDEPENDENT_AMBULATORY_CARE_PROVIDER_SITE_OTHER): Payer: Self-pay

## 2017-04-05 VITALS — BP 158/88 | HR 66 | Ht 63.0 in | Wt 203.5 lb

## 2017-04-05 DIAGNOSIS — G2581 Restless legs syndrome: Secondary | ICD-10-CM | POA: Insufficient documentation

## 2017-04-05 DIAGNOSIS — G63 Polyneuropathy in diseases classified elsewhere: Secondary | ICD-10-CM | POA: Diagnosis not present

## 2017-04-05 DIAGNOSIS — M48061 Spinal stenosis, lumbar region without neurogenic claudication: Secondary | ICD-10-CM

## 2017-04-05 MED ORDER — GABAPENTIN 100 MG PO CAPS: 300.0000 mg | ORAL_CAPSULE | Freq: Two times a day (BID) | ORAL | 11 refills | Status: DC

## 2017-04-05 MED ORDER — GABAPENTIN (ONCE-DAILY) 300 MG PO TABS: 600.0000 mg | ORAL_TABLET | Freq: Every day | ORAL | 11 refills | Status: DC

## 2017-04-05 NOTE — Progress Notes (Signed)
PATIENT: Cheryl Brown DOB: 11/21/1935  Chief Complaint  Patient presents with  . Leg Jerking    She is here with her husband, Cheryl Brown. Reports intermittent episodes of severe leg jerking and pain that is especially worse at night.  She is currently on gabapentin and pramipexole.  She has been taking these medications since January for RLS and diabetic neuropathy.  [name] PCP    [name], MD     HISTORICAL  BREELLE Brown is a 81 year old right-handed female , seen in refer by her primary care doctor  [name] for evaluation of leg jerking, initial evaluation was on April 05 2017.   I have reviewed and summarized the referring note, she has past medical history of insulin-dependent diabetes, hypertension, COPD, pituitary gland adenoma, that is stable under close supervision  She reported a history of bilateral lower extremity paresthesia, chronic low back pain since 2008, gradually getting worse, she also has intermittent radiating pain to her left posterior calf, sometimes the right side,  She complains of urge to move her legs at that time before she go to bed, sometimes even in the early afternoon such as 2 PM, she was put on Mirapex 0.25 milligram every night since May 2018, which has helped her symptoms some, she is also taking gabapentin 100 mg tablets, 1-2 tablet at 2 PM, and extra 3 tablets at nighttime for symptoms control,   She has COPD, limited activity because of shortness of breath with exertion.  I personally reviewed MRI of lumbar in May 2017, mild multilevel degenerative changes there was no significant canal or foraminal narrowing.  I reviewed laboratory evaluation mild elevated glucose 107, normal creatinine 0.69, normal CBC with hemoglobin 14.5  REVIEW OF SYSTEMS: Full 14 system review of systems performed and notable only for weight gain, fatigue, palpitation, sweating legs, blurred vision, double vision, shortness of breath, snoring, constipation ACT  bruising, headache numbness weakness dizziness, insomnia snoring, restless leg, allergy runny nose insensitivity  ALLERGIES: Allergies  Allergen Reactions  . Morphine And Related Shortness Of Breath  . Codeine     nausea  . Ipratropium-Albuterol     REACTION: VERY NERVOUS  . Niaspan [Niacin]     Abdominal pain  . Norvasc [Amlodipine Besylate]     10/15/14 headache 08/27/15 abdominal pain, indigestion  . Pramipexole Dihydrochloride     Rash & itching  . Pregabalin     REACTION: swelling  . Propoxyphene N-Acetaminophen     REACTION: UPSET STOMACH  . Simvastatin     REACTION: Leg cramps  . Sulfonamide Derivatives Itching    HOME MEDICATIONS: Current Outpatient Prescriptions  Medication Sig Dispense Refill  . BD PEN NEEDLE NANO U/F 32G X 4 MM MISC use as directed once daily 100 each 10  . fluticasone furoate-vilanterol (BREO ELLIPTA) 100-25 MCG/INH AEPB Inhale 1 puff into the lungs daily.    . furosemide (LASIX) 20 MG tablet Take 1 tablet (20 mg total) by mouth daily. (Patient taking differently: Take 20 mg by mouth daily as needed for fluid. ) 30 tablet 6  . gabapentin (NEURONTIN) 100 MG capsule Reports taking 1-2 tablets around 3pm and 2-3 tablets at bedtime.    [name] glucose blood (ONE TOUCH ULTRA TEST) test strip 1 each by Other route daily. Use to check blood sugar daily Dx: E11.41 90 each 3  . Insulin Glargine (TOUJEO SOLOSTAR [place]) Inject 22 Units into the skin every morning.    [name] losartan (COZAAR) 50 MG tablet Take  50 mg by mouth daily.  1  . ONETOUCH DELICA LANCETS MISC Check blood sugar as directed 100 each 3  . polyethylene glycol powder (MIRALAX) powder Take 34 g by mouth daily. 2 capfuls daily per Urologist     . pramipexole (MIRAPEX) 0.25 MG tablet TAKE 1 TABLET AT BEDTIME 90 DAYS  3  . PREMARIN vaginal cream Place 1 application vaginally every other day.     No current facility-administered medications for this visit.     PAST MEDICAL HISTORY: Past Medical History:    Diagnosis Date  . Blood transfusion   . COPD (chronic obstructive pulmonary disease) ([place])   . Diabetes mellitus   . Dysmetabolic syndrome   . Hyperlipemia   . Hypertension   . IBS (irritable bowel syndrome)   . Neoplasm of pituitary gland    Dr.Nudelman   . Palpitations   . Pituitary adenoma ([place]) 1991   Dr.Love and Dr.Nudelman   . Recurrent UTI 2013-14   Dr. Thomasene Mohair and Dr. Marvel Plan  . Reflux esophagitis   . Restless leg syndrome   . Scoliosis    Spinal Stenosis  . Shingles outbreak     PAST SURGICAL HISTORY: Past Surgical History:  Procedure Laterality Date  . ANKLE FRACTURE SURGERY     left ankle  . BREAST LUMPECTOMY     4 lumps removed from breast, all benign   . CHOLECYSTECTOMY    . COLONOSCOPY  2014   Tics, [facility]  . CYSTOSCOPY  2014    Dr Thomasene Mohair  . ESOPHAGEAL DILATION  01/2003  . FINGER AMPUTATION     related to work injury   . g2 p2    . KNEE SURGERY  [id number]    complicated by difficult resuscitation post anesthesia   . LAPAROSCOPIC CHOLECYSTECTOMY  03/2011   [facility] [place]     for dysfunctional bleeding   . pituitary adenoma     Dr. Erling Cruz  . UPPER GI ENDOSCOPY  2014   gastric polyp  . URETHRAL DILATION  2014    FAMILY HISTORY: Family History  Problem Relation Age of Onset  . Alzheimer's disease Mother   . Arthritis Mother   . Mental illness Mother        alzheimers  . Bone cancer Father   . Prostate cancer Father   . Diabetes Father   . Coronary artery disease Father   . Cancer Father        bone, prostate  . Alzheimer's disease Maternal Aunt   . Mental illness Maternal Aunt        alzheimers  . Mitral valve prolapse Sister   . Alzheimer's disease Sister   . Hypertension Sister   . Diabetes Sister   . Stroke Paternal Grandmother   . Osteoporosis Sister   . Mental illness Maternal Grandmother        alzheimers    SOCIAL HISTORY:  Social History   Social History  . Marital status: Widowed     Spouse name: N/A  . Number of children: 2  . Years of education: 9th   Occupational History  . Retired    Social History Main Topics  . Smoking status: Former Smoker    Quit date: 08/10/1978  . Smokeless tobacco: Never Used  . Alcohol use No  . Drug use: No  . Sexual activity: Not on file   Other Topics Concern  . Not on file   Social History Narrative   Lives  at home with her husband.   Right-handed.   No caffeine use.     PHYSICAL EXAM   Vitals:   04/05/17 1334  BP: (!) 158/88  Pulse: 66  Weight: 203 lb 8 oz (92.3 kg)  Height: 5\' 3"  (1.6 m)    Not recorded      Body mass index is 36.05 kg/m.  PHYSICAL EXAMNIATION:  Gen: NAD, conversant, well nourised, obese, well groomed                     Cardiovascular: Regular rate rhythm, no peripheral edema, warm, nontender. Eyes: Conjunctivae clear without exudates or hemorrhage Neck: Supple, no carotid bruits. Pulmonary: Clear to auscultation bilaterally   NEUROLOGICAL EXAM:  MENTAL STATUS: Speech:    Speech is normal; fluent and spontaneous with normal comprehension.  Cognition:     Orientation to time, place and person     Normal recent and remote memory     Normal Attention span and concentration     Normal Language, naming, repeating,spontaneous speech     Fund of knowledge   CRANIAL NERVES: CN II: Visual fields are full to confrontation. Fundoscopic exam is normal with sharp discs and no vascular changes. Pupils are round equal and briskly reactive to light. CN III, IV, VI: extraocular movement are normal. No ptosis. CN V: Facial sensation is intact to pinprick in all 3 divisions bilaterally. Corneal responses are intact.  CN VII: Face is symmetric with normal eye closure and smile. CN VIII: Hearing is normal to rubbing fingers CN IX, X: Palate elevates symmetrically. Phonation is normal. CN XI: Head turning and shoulder shrug are intact CN XII: Tongue is midline with normal movements and no  atrophy.  MOTOR: There is no pronator drift of out-stretched arms. Muscle bulk and tone are normal. Muscle strength is normal.Right hand finger were missing  REFLEXES: Reflexes are 2+ and symmetric at the biceps, triceps, knees, and absent at ankles. Plantar responses are flexor.  SENSORY: Length dependent decreased to  light touch, pinprick,  and vibratory sensation to distal shin level \  COORDINATION: Rapid alternating movements and fine finger movements are intact. There is no dysmetria on finger-to-nose and heel-knee-shin.    GAIT/STANCE: Posture is normal. Gait is steady with normal steps, base, arm swing, and turning. Heel and toe walking are normal. Tandem gait is normal.  Romberg is absent.   DIAGNOSTIC DATA (LABS, IMAGING, TESTING) - I reviewed patient records, labs, notes, testing and imaging myself where available.   ASSESSMENT AND PLAN  JI FAIRBURN is a 81 y.o. female   Peripheral neuropathy  Restless leg syndrome   Laboratory evaluations for etiology   Gralise 300mg  2 tab every night  Gabapentin 100mg  up to 4 tabs each day  EMG/NCS.  Marcial Pacas, M.D. Ph.D.  [facility] [address] [place], [place] [address] Ph: [phone] Fax: [phone]  CC: [name], MD

## 2017-04-07 ENCOUNTER — Telehealth: Payer: Self-pay | Admitting: Neurology

## 2017-04-07 DIAGNOSIS — H04123 Dry eye syndrome of bilateral lacrimal glands: Secondary | ICD-10-CM

## 2017-04-07 DIAGNOSIS — M3500 Sicca syndrome, unspecified: Secondary | ICD-10-CM

## 2017-04-07 DIAGNOSIS — R768 Other specified abnormal immunological findings in serum: Secondary | ICD-10-CM

## 2017-04-07 LAB — ANA W/REFLEX: ANA: POSITIVE — AB

## 2017-04-07 LAB — SEDIMENTATION RATE: Sed Rate: 2 mm/hr (ref 0–40)

## 2017-04-07 LAB — VITAMIN B12: Vitamin B-12: 1380 pg/mL — ABNORMAL HIGH (ref 232–1245)

## 2017-04-07 LAB — FERRITIN: FERRITIN: 150 ng/mL (ref 15–150)

## 2017-04-07 LAB — C-REACTIVE PROTEIN: CRP: 5.7 mg/L — AB (ref 0.0–4.9)

## 2017-04-07 LAB — ENA+DNA/DS+SJORGEN'S
DSDNA AB: 2 [IU]/mL (ref 0–9)
ENA SM Ab Ser-aCnc: <0.2 AI (ref 0.0–0.9)
ENA SSA (RO) Ab: 1.4 AI — ABNORMAL HIGH (ref 0.0–0.9)
ENA SSB (LA) Ab: <0.2 AI (ref 0.0–0.9)

## 2017-04-07 LAB — CK: Total CK: 73 U/L (ref 24–173)

## 2017-04-07 LAB — TSH: TSH: 1.33 u[IU]/mL (ref 0.450–4.500)

## 2017-04-07 LAB — HEMOGLOBIN A1C
ESTIMATED AVERAGE GLUCOSE: 186 mg/dL
HEMOGLOBIN A1C: 8.1 % — AB (ref 4.8–5.6)

## 2017-04-07 LAB — RPR: RPR Ser Ql: NONREACTIVE

## 2017-04-07 LAB — VITAMIN D 25 HYDROXY (VIT D DEFICIENCY, FRACTURES): Vit D, 25-Hydroxy: 40.1 ng/mL (ref 30.0–100.0)

## 2017-04-07 LAB — COPPER, SERUM: COPPER: 106 ug/dL (ref 72–166)

## 2017-04-07 NOTE — Telephone Encounter (Addendum)
Left messages for patient on home and mobile numbers.

## 2017-04-07 NOTE — Telephone Encounter (Signed)
Laboratory evaluation showed positive ANA, SSA,  this could iindicates and autoimmune process such as Sjogren's disease, I ask her if she has any signs of dry eyes, dry mouth, may consider repeat laboratory evaluation at her next follow-up visit,  A1c was elevated 8.1,   Rest of the laboratory evaluation showed no significant abnormality,  She needs better control of her diabetes, continue moderate exercise,

## 2017-04-08 NOTE — Telephone Encounter (Signed)
Spoke to patient - she is aware of results and reports having extreme difficulty with dry eyes and dry mouth.  She thought these symptoms were related to medication side effects.  She is agreeable to be referred to rheumatology for further evaluation.  Order placed in [place].

## 2017-04-08 NOTE — Addendum Note (Signed)
Addended by: Noberto Retort C on: 04/08/2017 12:02 PM   Modules accepted: Orders

## 2017-04-20 ENCOUNTER — Telehealth: Payer: Self-pay | Admitting: *Deleted

## 2017-04-20 NOTE — Telephone Encounter (Signed)
Gralise approved by [employer] [id number]) through 08/09/17.  Pt [id number].  EOC/PA# [id number].  Pharmacy shows a paid claim and patient was able to pick up her medication.

## 2017-04-21 ENCOUNTER — Encounter: Payer: Self-pay | Admitting: *Deleted

## 2017-04-21 NOTE — Telephone Encounter (Signed)
Patient calling stating Gralise is not helping. Does she need to take gabapentin (NEURONTIN) 100 MG capsule and pramipexole (MIRAPEX) 0.25 MG tablet with Gralise?

## 2017-04-21 NOTE — Telephone Encounter (Signed)
Patient picked up Gralise and took it for the first time last night.  She has continued with Mirapex 0.25mg  at bedtime.  She was unsure how often to take her immediate release gabapentin 100mg  during the day.  She is now aware that Dr. Krista Blue advised taking it up to 4 times per day.  She will try this plan and let us know if her pain is not improving.

## 2017-04-22 NOTE — Telephone Encounter (Signed)
Pt states that re: her being diagnosed with autoimmune disorder she was being referred to Dr Jobe Gibbon, she has not heard from anyone and is asking for a call back

## 2017-04-22 NOTE — Telephone Encounter (Signed)
Called Rheumatology Dr. Amil Amen is reviewing  her labs and patient will be called soon for apt. I have left patient a message as well.

## 2017-04-28 DIAGNOSIS — R600 Localized edema: Secondary | ICD-10-CM | POA: Diagnosis not present

## 2017-04-28 DIAGNOSIS — J321 Chronic frontal sinusitis: Secondary | ICD-10-CM | POA: Diagnosis not present

## 2017-04-28 DIAGNOSIS — J449 Chronic obstructive pulmonary disease, unspecified: Secondary | ICD-10-CM | POA: Diagnosis not present

## 2017-05-04 DIAGNOSIS — L820 Inflamed seborrheic keratosis: Secondary | ICD-10-CM | POA: Diagnosis not present

## 2017-05-04 DIAGNOSIS — B078 Other viral warts: Secondary | ICD-10-CM | POA: Diagnosis not present

## 2017-05-07 ENCOUNTER — Encounter (INDEPENDENT_AMBULATORY_CARE_PROVIDER_SITE_OTHER): Payer: Self-pay | Admitting: Neurology

## 2017-05-07 ENCOUNTER — Ambulatory Visit (INDEPENDENT_AMBULATORY_CARE_PROVIDER_SITE_OTHER): Payer: PPO | Admitting: Neurology

## 2017-05-07 DIAGNOSIS — M48061 Spinal stenosis, lumbar region without neurogenic claudication: Secondary | ICD-10-CM

## 2017-05-07 DIAGNOSIS — Z0289 Encounter for other administrative examinations: Secondary | ICD-10-CM

## 2017-05-07 DIAGNOSIS — G2581 Restless legs syndrome: Secondary | ICD-10-CM

## 2017-05-07 DIAGNOSIS — G63 Polyneuropathy in diseases classified elsewhere: Secondary | ICD-10-CM

## 2017-05-07 MED ORDER — PRAMIPEXOLE DIHYDROCHLORIDE 0.25 MG PO TABS: 0.2500 mg | ORAL_TABLET | Freq: Every day | ORAL | 4 refills | Status: DC

## 2017-05-07 NOTE — Procedures (Signed)
Full Name: Cheryl Brown Gender: Female MRN #: [id number] Date of Birth: 08/19/1935    Visit Date: 05/07/2017 11:48 Age: 81 Years 75 Months Old Examining Physician: Marcial Pacas, MD  Referring Physician: Krista Blue, MD History: 81 year old female, with poorly controlled diabetes, complains of bilateral feet numbness, also restless leg symptoms  Summary of the test:  Nerve conduction study: Bilateral sural, superficial peroneal sensory responses showed mildly decreased snap amplitude.  Bilateral peroneal to EDB and tibial motor responses were normal.  Electromyography: Selective needle examinations of bilateral lower extremity muscles and bilateral lumbar sacral paraspinal muscles were normal.  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 There is no evidence of bilateral lumbar sacral radiculopathy.   ------------------------------- Marcial Pacas, M.D.  [facility] [place], [place] [address] Tel: [phone] Fax: [phone]        [facility]    Nerve / Sites Muscle Latency Ref. Amplitude Ref. Rel Amp Segments Distance Velocity Ref. Area    ms ms mV mV %  cm m/s m/s mVms  L Peroneal - EDB     Ankle EDB 5.7 ?6.5 2.4 ?2.0 100 Ankle - EDB 9   7.1     Fib head EDB 10.7  2.1  86.5 Fib head - Ankle 26 51 ?44 7.0     Pop fossa EDB 12.6  2.5  120 Pop fossa - Fib head 10 53 ?44 9.2         Pop fossa - Ankle      R Peroneal - EDB     Ankle EDB 6.0 ?6.5 3.2 ?2.0 100 Ankle - EDB 9   11.3     Fib head EDB 12.1  3.0  92.6 Fib head - Ankle 27 45 ?44 11.2     Pop fossa EDB 14.3  3.0  102 Pop fossa - Fib head 10 46 ?44 12.9         Pop fossa - Ankle      L Tibial - AH     Ankle AH 5.6 ?5.8 4.1 ?4.0 100 Ankle - AH 9   15.8     Pop fossa AH 13.3  3.9  94.3 Pop fossa - Ankle 34 44 ?41 16.2  R Tibial - AH     Ankle AH 5.3  ?5.8 7.9 ?4.0 100 Ankle - AH 9   20.1     Pop fossa AH 13.3  5.8  73.5 Pop fossa - Ankle 34 43 ?41 15.8             SNC    Nerve / Sites Rec. Site Peak Lat Ref.  Amp Ref. Segments Distance    ms ms V V  cm  L Sural - Ankle (Calf)     Calf Ankle 3.3 ?4.4 5 ?6 Calf - Ankle 14  R Sural - Ankle (Calf)     Calf Ankle 3.9 ?4.4 4 ?6 Calf - Ankle 14  L Superficial peroneal - Ankle     Lat leg Ankle 3.8 ?4.4 4 ?6 Lat leg - Ankle 14  R Superficial peroneal - Ankle     Lat leg Ankle 3.7 ?4.4 3 ?6 Lat leg - Ankle 14             F  Wave    Nerve F Lat Ref.   ms ms  L Tibial -  AH 53.4 ?56.0  R Tibial - AH 54.3 ?56.0         EMG full       EMG Summary Table    Spontaneous MUAP Recruitment  Muscle IA Fib PSW Fasc Other Amp Dur. Poly Pattern  R. Tibialis anterior Normal None None None _______ Normal Normal Normal Normal  R. Tibialis posterior Normal None None None _______ Normal Normal Normal Normal  R. Peroneus longus Normal None None None _______ Normal Normal Normal Normal  R. Vastus lateralis Normal None None None _______ Normal Normal Normal Normal  L. Tibialis anterior Normal None None None _______ Normal Normal Normal Normal  L. Tibialis posterior Normal None None None _______ Normal Normal Normal Normal  L. Peroneus longus Normal None None None _______ Normal Normal Normal Normal  L. Gastrocnemius (Lateral head) Normal None None None _______ Normal Normal Normal Normal  L. Vastus lateralis Normal None None None _______ Normal Normal Normal Normal  L. Lumbar paraspinals (low) Normal None None None _______ Normal Normal Normal Normal  L. Lumbar paraspinals (mid) Normal None None None _______ Normal Normal Normal Normal  R. Lumbar paraspinals (low) Normal None None None _______ Normal Normal Normal Normal  R. Lumbar paraspinals (mid) Normal None None None _______ Normal Normal Normal Normal

## 2017-05-07 NOTE — Progress Notes (Signed)
She complains of side effect with Gralise, drowsiness, high co-pay of USD85 each month.  She does better with mirapex 0.25mg  at night plus gabapentin 100mg  2-3 tabs every night

## 2017-05-11 DIAGNOSIS — H43811 Vitreous degeneration, right eye: Secondary | ICD-10-CM | POA: Diagnosis not present

## 2017-05-11 DIAGNOSIS — H31012 Macula scars of posterior pole (postinflammatory) (post-traumatic), left eye: Secondary | ICD-10-CM | POA: Diagnosis not present

## 2017-05-11 DIAGNOSIS — H43822 Vitreomacular adhesion, left eye: Secondary | ICD-10-CM | POA: Diagnosis not present

## 2017-05-11 DIAGNOSIS — H35371 Puckering of macula, right eye: Secondary | ICD-10-CM | POA: Diagnosis not present

## 2017-05-20 DIAGNOSIS — Z23 Encounter for immunization: Secondary | ICD-10-CM | POA: Diagnosis not present

## 2017-05-20 DIAGNOSIS — Z6836 Body mass index (BMI) 36.0-36.9, adult: Secondary | ICD-10-CM | POA: Diagnosis not present

## 2017-05-20 DIAGNOSIS — R768 Other specified abnormal immunological findings in serum: Secondary | ICD-10-CM | POA: Diagnosis not present

## 2017-05-20 DIAGNOSIS — M3500 Sicca syndrome, unspecified: Secondary | ICD-10-CM | POA: Diagnosis not present

## 2017-05-20 DIAGNOSIS — E669 Obesity, unspecified: Secondary | ICD-10-CM | POA: Diagnosis not present

## 2017-07-03 DIAGNOSIS — J22 Unspecified acute lower respiratory infection: Secondary | ICD-10-CM | POA: Diagnosis not present

## 2017-07-03 DIAGNOSIS — J029 Acute pharyngitis, unspecified: Secondary | ICD-10-CM | POA: Diagnosis not present

## 2017-07-03 DIAGNOSIS — R0602 Shortness of breath: Secondary | ICD-10-CM | POA: Diagnosis not present

## 2017-07-03 DIAGNOSIS — R509 Fever, unspecified: Secondary | ICD-10-CM | POA: Diagnosis not present

## 2017-07-03 DIAGNOSIS — E119 Type 2 diabetes mellitus without complications: Secondary | ICD-10-CM | POA: Diagnosis not present

## 2017-07-06 DIAGNOSIS — E1140 Type 2 diabetes mellitus with diabetic neuropathy, unspecified: Secondary | ICD-10-CM | POA: Diagnosis not present

## 2017-07-06 DIAGNOSIS — G2581 Restless legs syndrome: Secondary | ICD-10-CM | POA: Diagnosis not present

## 2017-07-06 DIAGNOSIS — I10 Essential (primary) hypertension: Secondary | ICD-10-CM | POA: Diagnosis not present

## 2017-07-06 DIAGNOSIS — E1165 Type 2 diabetes mellitus with hyperglycemia: Secondary | ICD-10-CM | POA: Diagnosis not present

## 2017-07-06 DIAGNOSIS — Z79899 Other long term (current) drug therapy: Secondary | ICD-10-CM | POA: Diagnosis not present

## 2017-07-06 DIAGNOSIS — J441 Chronic obstructive pulmonary disease with (acute) exacerbation: Secondary | ICD-10-CM | POA: Diagnosis not present

## 2017-07-06 DIAGNOSIS — Z23 Encounter for immunization: Secondary | ICD-10-CM | POA: Diagnosis not present

## 2017-07-06 DIAGNOSIS — J101 Influenza due to other identified influenza virus with other respiratory manifestations: Secondary | ICD-10-CM | POA: Diagnosis not present

## 2017-07-06 DIAGNOSIS — K219 Gastro-esophageal reflux disease without esophagitis: Secondary | ICD-10-CM | POA: Diagnosis not present

## 2017-07-06 DIAGNOSIS — Z0000 Encounter for general adult medical examination without abnormal findings: Secondary | ICD-10-CM | POA: Diagnosis not present

## 2017-07-06 DIAGNOSIS — Z1389 Encounter for screening for other disorder: Secondary | ICD-10-CM | POA: Diagnosis not present

## 2017-07-14 DIAGNOSIS — Z794 Long term (current) use of insulin: Secondary | ICD-10-CM | POA: Diagnosis not present

## 2017-07-14 DIAGNOSIS — E1165 Type 2 diabetes mellitus with hyperglycemia: Secondary | ICD-10-CM | POA: Diagnosis not present

## 2017-07-14 DIAGNOSIS — E1140 Type 2 diabetes mellitus with diabetic neuropathy, unspecified: Secondary | ICD-10-CM | POA: Diagnosis not present

## 2017-07-14 DIAGNOSIS — G2581 Restless legs syndrome: Secondary | ICD-10-CM | POA: Diagnosis not present

## 2017-07-16 DIAGNOSIS — E1140 Type 2 diabetes mellitus with diabetic neuropathy, unspecified: Secondary | ICD-10-CM | POA: Diagnosis not present

## 2017-07-16 DIAGNOSIS — J449 Chronic obstructive pulmonary disease, unspecified: Secondary | ICD-10-CM | POA: Diagnosis not present

## 2017-07-16 DIAGNOSIS — J441 Chronic obstructive pulmonary disease with (acute) exacerbation: Secondary | ICD-10-CM | POA: Diagnosis not present

## 2017-07-16 DIAGNOSIS — Z794 Long term (current) use of insulin: Secondary | ICD-10-CM | POA: Diagnosis not present

## 2017-07-16 DIAGNOSIS — M189 Osteoarthritis of first carpometacarpal joint, unspecified: Secondary | ICD-10-CM | POA: Diagnosis not present

## 2017-07-16 DIAGNOSIS — I10 Essential (primary) hypertension: Secondary | ICD-10-CM | POA: Diagnosis not present

## 2017-07-16 DIAGNOSIS — E119 Type 2 diabetes mellitus without complications: Secondary | ICD-10-CM | POA: Diagnosis not present

## 2017-07-20 ENCOUNTER — Institutional Professional Consult (permissible substitution): Payer: PPO | Admitting: Pulmonary Disease

## 2017-08-26 ENCOUNTER — Institutional Professional Consult (permissible substitution): Payer: PPO | Admitting: Pulmonary Disease

## 2017-08-31 DIAGNOSIS — I10 Essential (primary) hypertension: Secondary | ICD-10-CM | POA: Diagnosis not present

## 2017-08-31 DIAGNOSIS — Z961 Presence of intraocular lens: Secondary | ICD-10-CM | POA: Diagnosis not present

## 2017-08-31 DIAGNOSIS — H35372 Puckering of macula, left eye: Secondary | ICD-10-CM | POA: Diagnosis not present

## 2017-08-31 DIAGNOSIS — H02839 Dermatochalasis of unspecified eye, unspecified eyelid: Secondary | ICD-10-CM | POA: Diagnosis not present

## 2017-09-07 DIAGNOSIS — N952 Postmenopausal atrophic vaginitis: Secondary | ICD-10-CM | POA: Diagnosis not present

## 2017-09-07 DIAGNOSIS — Z1231 Encounter for screening mammogram for malignant neoplasm of breast: Secondary | ICD-10-CM | POA: Diagnosis not present

## 2017-09-07 DIAGNOSIS — Z1389 Encounter for screening for other disorder: Secondary | ICD-10-CM | POA: Diagnosis not present

## 2017-09-07 DIAGNOSIS — L900 Lichen sclerosus et atrophicus: Secondary | ICD-10-CM | POA: Diagnosis not present

## 2017-09-07 DIAGNOSIS — Z01419 Encounter for gynecological examination (general) (routine) without abnormal findings: Secondary | ICD-10-CM | POA: Diagnosis not present

## 2017-09-10 ENCOUNTER — Encounter: Payer: Self-pay | Admitting: Pulmonary Disease

## 2017-09-10 ENCOUNTER — Ambulatory Visit: Payer: PPO | Admitting: Pulmonary Disease

## 2017-09-10 ENCOUNTER — Institutional Professional Consult (permissible substitution): Payer: PPO | Admitting: Pulmonary Disease

## 2017-09-10 ENCOUNTER — Ambulatory Visit (INDEPENDENT_AMBULATORY_CARE_PROVIDER_SITE_OTHER)
Admission: RE | Admit: 2017-09-10 | Discharge: 2017-09-10 | Disposition: A | Discharge status: Home / Self Care | Payer: PPO | Source: Ambulatory Visit | Attending: Pulmonary Disease | Admitting: Pulmonary Disease

## 2017-09-10 VITALS — BP 134/80 | HR 61 | Ht 62.5 in | Wt 202.6 lb

## 2017-09-10 DIAGNOSIS — R0602 Shortness of breath: Secondary | ICD-10-CM | POA: Diagnosis not present

## 2017-09-10 DIAGNOSIS — R0600 Dyspnea, unspecified: Secondary | ICD-10-CM | POA: Diagnosis not present

## 2017-09-10 LAB — NITRIC OXIDE: Nitric Oxide: 9

## 2017-09-10 NOTE — Patient Instructions (Addendum)
We will schedule you for pulmonary function test.  Check chest x-ray and FENO We will check CBC differential, blood allergy profile Continue on the Breo.  We will start albuterol rescue inhaler Follow-up in 1-2 weeks for review of tests and further workup as needed.

## 2017-09-10 NOTE — Progress Notes (Signed)
Cheryl Brown    [id number]    [birth year]  Primary Care Physician:Gates, [name], MD  Referring Physician: [name], MD [address] [place], [place] [address]  Chief complaint: Consult for COPD  HPI: 82 year old with history of hypertension, diabetes, COPD, OSA (noncompliant with CPAP].   She was diagnosed with COPD in 2012.  Has been maintained on Advair.  Reports worsening dyspnea on exertion for the past few months- year.  Has dyspnea with rest, chronic cough with white mucus, wheezing, occasional fevers and chills at night.  She does not have seasonal allergies. + for acid reflux and indigestion.   Seen by Dr. Inda Merlin last month and anoro was switched to [facility]  She cannot tell if this is helping any more than Advair. Diagnosed with sleep apnea.  She has been intolerant of CPAP and is not interested in trying again.  Pets: No pets Occupation: Retired from [employer] in 1979 Exposures: No known exposures, no mold, hot tub Smoking history: 30-pack-year smoking history.  Quit in 1988 Travel History: Lived in [place] all her life.  No significant travel history.  Outpatient Encounter Medications as of 09/10/2017  Medication Sig  . BD PEN NEEDLE NANO U/F 32G X 4 MM MISC use as directed once daily  . fluticasone furoate-vilanterol (BREO ELLIPTA) 100-25 MCG/INH AEPB Inhale 1 puff into the lungs daily.  . furosemide (LASIX) 20 MG tablet Take 1 tablet (20 mg total) by mouth daily. (Patient taking differently: Take 20 mg by mouth daily as needed for fluid. )  . gabapentin (NEURONTIN) 100 MG capsule Take 3 capsules (300 mg total) by mouth 2 (two) times daily.  [name] glucose blood (ONE TOUCH ULTRA TEST) test strip 1 each by Other route daily. Use to check blood sugar daily Dx: E11.41  . Insulin Glargine (TOUJEO SOLOSTAR [place]) Inject 22 Units into the skin every morning.  [name] losartan (COZAAR) 50 MG tablet Take 50 mg by mouth daily.  [name] DELICA LANCETS MISC Check blood  sugar as directed  . polyethylene glycol powder (MIRALAX) powder Take 34 g by mouth daily. 2 capfuls daily per Urologist   . pramipexole (MIRAPEX) 0.25 MG tablet Take 1 tablet (0.25 mg total) by mouth at bedtime.  [name] PREMARIN vaginal cream Place 1 application vaginally every other day.   No facility-administered encounter medications on file as of 09/10/2017.     Allergies as of 09/10/2017 - Review Complete 09/10/2017  Allergen Reaction Noted  . Morphine and related Shortness Of Breath 12/23/2011  . Codeine    . Ipratropium-albuterol    . Niaspan [niacin]  02/16/2011  . Norvasc [amlodipine besylate]  10/15/2014  . Pramipexole dihydrochloride    . Pregabalin    . Propoxyphene n-acetaminophen    . Simvastatin    . Sulfonamide derivatives Itching     Past Medical History:  Diagnosis Date  . Blood transfusion   . COPD (chronic obstructive pulmonary disease) ([place])   . Diabetes mellitus   . Dysmetabolic syndrome   . Hyperlipemia   . Hypertension   . IBS (irritable bowel syndrome)   . Neoplasm of pituitary gland    Dr.Nudelman   . Palpitations   . Pituitary adenoma ([place]) 1991   Dr.Love and Dr.Nudelman   . Recurrent UTI 2013-14   Dr. Thomasene Mohair and Dr. Marvel Plan  . Reflux esophagitis   . Restless leg syndrome   . Scoliosis    Spinal Stenosis  . Shingles outbreak  Past Surgical History:  Procedure Laterality Date  . ANKLE FRACTURE SURGERY     left ankle  . BREAST LUMPECTOMY     4 lumps removed from breast, all benign   . CHOLECYSTECTOMY    . COLONOSCOPY  2014   Tics, [facility]  . CYSTOSCOPY  2014    Dr Thomasene Mohair  . ESOPHAGEAL DILATION  01/2003  . FINGER AMPUTATION     related to work injury   . g2 p2    . KNEE SURGERY  [id number]/7654    complicated by difficult resuscitation post anesthesia   . LAPAROSCOPIC CHOLECYSTECTOMY  03/2011   [facility] [place]     for dysfunctional bleeding   . pituitary adenoma     Dr. Erling Cruz  . UPPER GI ENDOSCOPY   2014   gastric polyp  . URETHRAL DILATION  2014    Family History  Problem Relation Age of Onset  . Alzheimer's disease Mother   . Arthritis Mother   . Mental illness Mother        alzheimers  . Bone cancer Father   . Prostate cancer Father   . Diabetes Father   . Coronary artery disease Father   . Cancer Father        bone, prostate  . Alzheimer's disease Maternal Aunt   . Mental illness Maternal Aunt        alzheimers  . Mitral valve prolapse Sister   . Alzheimer's disease Sister   . Hypertension Sister   . Diabetes Sister   . Stroke Paternal Grandmother   . Osteoporosis Sister   . Mental illness Maternal Grandmother        alzheimers    Social History   Socioeconomic History  . Marital status: Widowed    Spouse name: Not on file  . Number of children: 2  . Years of education: 9th  . Highest education level: Not on file  Social Needs  . Financial resource strain: Not on file  . Food insecurity - worry: Not on file  . Food insecurity - inability: Not on file  . Transportation needs - medical: Not on file  . Transportation needs - non-medical: Not on file  Occupational History  . Occupation: Retired  Tobacco Use  . Smoking status: Former Smoker    Packs/day: 0.50    Years: 25.00    Pack years: 12.50    Types: Cigarettes    Last attempt to quit: 08/10/1978    Years since quitting: 39.1  . Smokeless tobacco: Never Used  Substance and Sexual Activity  . Alcohol use: No  . Drug use: No  . Sexual activity: Not on file  Other Topics Concern  . Not on file  Social History Narrative   Lives at home with her husband.   Right-handed.   No caffeine use.    Review of systems: Review of Systems  Constitutional: Negative for fever and chills.  HENT: Negative.   Eyes: Negative for blurred vision.  Respiratory: as per HPI  Cardiovascular: Negative for chest pain and palpitations.  Gastrointestinal: Negative for vomiting, diarrhea, blood per  rectum. Genitourinary: Negative for dysuria, urgency, frequency and hematuria.  Musculoskeletal: Negative for myalgias, back pain and joint pain.  Skin: Negative for itching and rash.  Neurological: Negative for dizziness, tremors, focal weakness, seizures and loss of consciousness.  Endo/Heme/Allergies: Negative for environmental allergies.  Psychiatric/Behavioral: Negative for depression, suicidal ideas and hallucinations.  All other systems reviewed and are negative.  Physical Exam: Blood pressure 134/80, pulse 61, height 5' 2.5" (1.588 m), weight 202 lb 9.6 oz (91.9 kg), SpO2 97 %. Gen:      No acute distress HEENT:  EOMI, sclera anicteric Neck:     No masses; no thyromegaly Lungs:    Clear to auscultation bilaterally; normal respiratory effort CV:         Regular rate and rhythm; no murmurs Abd:      + bowel sounds; soft, non-tender; no palpable masses, no distension Ext:    No edema; adequate peripheral perfusion Skin:      Warm and dry; no rash Neuro: alert and oriented x 3 Psych: normal mood and affect  Data Reviewed: CT abdomen pelvis 08/28/16-bibasilar opacities, atelectasis Chest x-ray 08/28/16-coarse interstitial marking, nodular opacity in the left midlung region. I have reviewed the images personally.  FENO 09/10/17- 9  Assessment:  Consult for COPD Could have COPD although it would be unusual as she does not have significant smoking history and had quit 30-40 years ago  Previous chest x-ray and CT shows some interstitial changes and possible interstitial lung disease. We will evaluate with pulmonary function test and chest x-ray. If abnormal she will need high-resolution CT.  Plan/Recommendations: - Continue breo. Add albuterol rescue inhaler - PFTs, CXR  Marshell Garfinkel MD [facility] Pager [phone] 09/10/2017, 2:18 PM  CC: [name], MD

## 2017-09-13 ENCOUNTER — Other Ambulatory Visit: Payer: Self-pay | Admitting: Pulmonary Disease

## 2017-09-13 DIAGNOSIS — J849 Interstitial pulmonary disease, unspecified: Secondary | ICD-10-CM

## 2017-09-15 ENCOUNTER — Ambulatory Visit (INDEPENDENT_AMBULATORY_CARE_PROVIDER_SITE_OTHER)
Admission: RE | Admit: 2017-09-15 | Discharge: 2017-09-15 | Disposition: A | Discharge status: Home / Self Care | Payer: PPO | Source: Ambulatory Visit | Attending: Pulmonary Disease | Admitting: Pulmonary Disease

## 2017-09-15 DIAGNOSIS — J849 Interstitial pulmonary disease, unspecified: Secondary | ICD-10-CM | POA: Diagnosis not present

## 2017-09-15 DIAGNOSIS — J439 Emphysema, unspecified: Secondary | ICD-10-CM | POA: Diagnosis not present

## 2017-09-21 ENCOUNTER — Other Ambulatory Visit (INDEPENDENT_AMBULATORY_CARE_PROVIDER_SITE_OTHER): Payer: PPO

## 2017-09-21 ENCOUNTER — Ambulatory Visit: Payer: PPO | Admitting: Pulmonary Disease

## 2017-09-21 ENCOUNTER — Ambulatory Visit (INDEPENDENT_AMBULATORY_CARE_PROVIDER_SITE_OTHER): Payer: PPO | Admitting: Pulmonary Disease

## 2017-09-21 ENCOUNTER — Encounter: Payer: Self-pay | Admitting: Pulmonary Disease

## 2017-09-21 VITALS — BP 142/86 | HR 65 | Ht 62.5 in | Wt 201.0 lb

## 2017-09-21 DIAGNOSIS — J849 Interstitial pulmonary disease, unspecified: Secondary | ICD-10-CM

## 2017-09-21 DIAGNOSIS — R0602 Shortness of breath: Secondary | ICD-10-CM

## 2017-09-21 LAB — CBC WITH DIFFERENTIAL/PLATELET
BASOS PCT: 1.3 % (ref 0.0–3.0)
Basophils Absolute: 0.1 10*3/uL (ref 0.0–0.1)
Eosinophils Absolute: 0.2 10*3/uL (ref 0.0–0.7)
Eosinophils Relative: 3.5 % (ref 0.0–5.0)
HCT: 43.5 % (ref 36.0–46.0)
Hemoglobin: 14.6 g/dL (ref 12.0–15.0)
Lymphocytes Relative: 25 % (ref 12.0–46.0)
Lymphs Abs: 1.5 10*3/uL (ref 0.7–4.0)
MCHC: 33.5 g/dL (ref 30.0–36.0)
MCV: 92.6 fl (ref 78.0–100.0)
MONO ABS: 0.5 10*3/uL (ref 0.1–1.0)
Monocytes Relative: 8.1 % (ref 3.0–12.0)
NEUTROS ABS: 3.7 10*3/uL (ref 1.4–7.7)
Neutrophils Relative %: 62.1 % (ref 43.0–77.0)
PLATELETS: 253 10*3/uL (ref 150.0–400.0)
RBC: 4.7 Mil/uL (ref 3.87–5.11)
RDW: 13.5 % (ref 11.5–15.5)
WBC: 5.9 10*3/uL (ref 4.0–10.5)

## 2017-09-21 LAB — C-REACTIVE PROTEIN: CRP: 0.7 mg/dL (ref 0.5–20.0)

## 2017-09-21 LAB — PULMONARY FUNCTION TEST
DL/VA % pred: 80 %
DL/VA: 3.72 ml/min/mmHg/L
DLCO unc % pred: 55 %
DLCO unc: 12.43 ml/min/mmHg
FEF 25-75 Post: 1.83 L/sec
FEF 25-75 Pre: 2.71 L/sec
FEF2575-%Change-Post: -32 %
FEF2575-%Pred-Post: 134 %
FEF2575-%Pred-Pre: 199 %
FEV1-%Change-Post: -3 %
FEV1-%Pred-Post: 90 %
FEV1-%Pred-Pre: 93 %
FEV1-Post: 1.65 L
FEV1-Pre: 1.7 L
FEV1FVC-%Change-Post: -2 %
FEV1FVC-%Pred-Pre: 116 %
FEV6-%Change-Post: 2 %
FEV6-%Pred-Post: 84 %
FEV6-%Pred-Pre: 82 %
FEV6-Post: 1.96 L
FEV6-Pre: 1.92 L
FEV6FVC-%Pred-Post: 106 %
FEV6FVC-%Pred-Pre: 106 %
FVC-%Change-Post: 0 %
FVC-%Pred-Post: 79 %
FVC-%Pred-Pre: 80 %
FVC-Post: 1.96 L
FVC-Pre: 1.97 L
Post FEV1/FVC ratio: 84 %
Post FEV6/FVC ratio: 100 %
Pre FEV1/FVC ratio: 86 %
Pre FEV6/FVC Ratio: 100 %
RV % pred: 62 %
RV: 1.43 L
TLC % pred: 73 %
TLC: 3.54 L

## 2017-09-21 LAB — SEDIMENTATION RATE: Sed Rate: 6 mm/hr (ref 0–30)

## 2017-09-21 NOTE — Progress Notes (Addendum)
Cheryl Brown    [id number]    31-Dec-1935  Primary Care Physician:Gates, [name], MD  Referring Physician: [name], MD [address] [place], [place] [address]  Chief complaint: Consult for dyspnea, interstitial lung disease.  HPI: 82 year old with history of hypertension, diabetes, COPD, OSA (noncompliant with CPAP].   She was diagnosed with COPD in 2012.  Has been maintained on Advair.  Reports worsening dyspnea on exertion for the past few months- year.  Has dyspnea with rest, chronic cough with white mucus, wheezing, occasional fevers and chills at night.  She does not have seasonal allergies. + for acid reflux and indigestion.   Seen by Dr. Inda Merlin last month and advair was switched to [facility].  She cannot tell if this is helping any more than Advair. Diagnosed with sleep apnea.  She has been intolerant of CPAP and is not interested in trying again. History noted for elevated ANA with positive ro antibody.  She was referred to Dr. Amil Amen in August 2018 but was told she did not have any rheumatologic issue.  She continues to have dry mouth, dry eyes.  Denies any joint pain, rash.  Pets: No pets Occupation: Retired from [employer] in 1979 Exposures: No known exposures, no mold, hot tub Smoking history: 30-pack-year smoking history.  Quit in 1988 Travel History: Lived in [place] all her life.  No significant travel history.  Interim history: Reports that dyspnea is unchanged.  She is off Breo but cannot tell if the breathing is any different.  She is had a CT scan and pulmonary function test and is here for review.  Outpatient Encounter Medications as of 09/21/2017  Medication Sig  . BD PEN NEEDLE NANO U/F 32G X 4 MM MISC use as directed once daily  . fluticasone furoate-vilanterol (BREO ELLIPTA) 100-25 MCG/INH AEPB Inhale 1 puff into the lungs daily.  . furosemide (LASIX) 20 MG tablet Take 1 tablet (20 mg total) by mouth daily. (Patient taking differently:  Take 20 mg by mouth daily as needed for fluid. )  . gabapentin (NEURONTIN) 100 MG capsule Take 3 capsules (300 mg total) by mouth 2 (two) times daily.  [name] glucose blood (ONE TOUCH ULTRA TEST) test strip 1 each by Other route daily. Use to check blood sugar daily Dx: E11.41  . Insulin Glargine (TOUJEO SOLOSTAR [place]) Inject 22 Units into the skin every morning.  [name] losartan (COZAAR) 50 MG tablet Take 50 mg by mouth daily.  [name] DELICA LANCETS MISC Check blood sugar as directed  . polyethylene glycol powder (MIRALAX) powder Take 34 g by mouth daily. 2 capfuls daily per Urologist   . pramipexole (MIRAPEX) 0.25 MG tablet Take 1 tablet (0.25 mg total) by mouth at bedtime.  [name] PREMARIN vaginal cream Place 1 application vaginally every other day.   No facility-administered encounter medications on file as of 09/21/2017.     Allergies as of 09/21/2017 - Review Complete 09/21/2017  Allergen Reaction Noted  . Morphine and related Shortness Of Breath 12/23/2011  . Codeine    . Ipratropium-albuterol    . Niaspan [niacin]  02/16/2011  . Norvasc [amlodipine besylate]  10/15/2014  . Pramipexole dihydrochloride    . Pregabalin    . Propoxyphene n-acetaminophen    . Simvastatin    . Sulfonamide derivatives Itching     Past Medical History:  Diagnosis Date  . Blood transfusion   . COPD (chronic obstructive pulmonary disease) ([place])   .  Diabetes mellitus   . Dysmetabolic syndrome   . Hyperlipemia   . Hypertension   . IBS (irritable bowel syndrome)   . Neoplasm of pituitary gland    Dr.Nudelman   . Palpitations   . Pituitary adenoma ([place]) 1991   Dr.Love and Dr.Nudelman   . Recurrent UTI 2013-14   Dr. Thomasene Mohair and Dr. Marvel Plan  . Reflux esophagitis   . Restless leg syndrome   . Scoliosis    Spinal Stenosis  . Shingles outbreak     Past Surgical History:  Procedure Laterality Date  . ANKLE FRACTURE SURGERY     left ankle  . BREAST LUMPECTOMY     4 lumps removed from breast, all  benign   . CHOLECYSTECTOMY    . COLONOSCOPY  2014   Tics, [facility]  . CYSTOSCOPY  2014    Dr Thomasene Mohair  . ESOPHAGEAL DILATION  01/2003  . FINGER AMPUTATION     related to work injury   . g2 p2    . KNEE SURGERY  08/7508    complicated by difficult resuscitation post anesthesia   . LAPAROSCOPIC CHOLECYSTECTOMY  03/2011   [facility] [place]     for dysfunctional bleeding   . pituitary adenoma     Dr. Erling Cruz  . UPPER GI ENDOSCOPY  2014   gastric polyp  . URETHRAL DILATION  2014    Family History  Problem Relation Age of Onset  . Alzheimer's disease Mother   . Arthritis Mother   . Mental illness Mother        alzheimers  . Bone cancer Father   . Prostate cancer Father   . Diabetes Father   . Coronary artery disease Father   . Cancer Father        bone, prostate  . Alzheimer's disease Maternal Aunt   . Mental illness Maternal Aunt        alzheimers  . Mitral valve prolapse Sister   . Alzheimer's disease Sister   . Hypertension Sister   . Diabetes Sister   . Stroke Paternal Grandmother   . Osteoporosis Sister   . Mental illness Maternal Grandmother        alzheimers    Social History   Socioeconomic History  . Marital status: Widowed    Spouse name: Not on file  . Number of children: 2  . Years of education: 9th  . Highest education level: Not on file  Social Needs  . Financial resource strain: Not on file  . Food insecurity - worry: Not on file  . Food insecurity - inability: Not on file  . Transportation needs - medical: Not on file  . Transportation needs - non-medical: Not on file  Occupational History  . Occupation: Retired  Tobacco Use  . Smoking status: Former Smoker    Packs/day: 0.50    Years: 25.00    Pack years: 12.50    Types: Cigarettes    Last attempt to quit: 08/10/1978    Years since quitting: 39.1  . Smokeless tobacco: Never Used  Substance and Sexual Activity  . Alcohol use: No  . Drug use: No  . Sexual  activity: Not on file  Other Topics Concern  . Not on file  Social History Narrative   Lives at home with her husband.   Right-handed.   No caffeine use.    Review of systems: Review of Systems  Constitutional: Negative for fever and chills.  HENT: Negative.  Eyes: Negative for blurred vision.  Respiratory: as per HPI  Cardiovascular: Negative for chest pain and palpitations.  Gastrointestinal: Negative for vomiting, diarrhea, blood per rectum. Genitourinary: Negative for dysuria, urgency, frequency and hematuria.  Musculoskeletal: Negative for myalgias, back pain and joint pain.  Skin: Negative for itching and rash.  Neurological: Negative for dizziness, tremors, focal weakness, seizures and loss of consciousness.  Endo/Heme/Allergies: Negative for environmental allergies.  Psychiatric/Behavioral: Negative for depression, suicidal ideas and hallucinations.  All other systems reviewed and are negative.  Physical Exam: Blood pressure (!) 142/86, pulse 65, height 5' 2.5" (1.588 m), weight 201 lb (91.2 kg), SpO2 91 %. Gen:      No acute distress HEENT:  EOMI, sclera anicteric Neck:     No masses; no thyromegaly Lungs:    Clear to auscultation bilaterally; normal respiratory effort CV:         Regular rate and rhythm; no murmurs Abd:      + bowel sounds; soft, non-tender; no palpable masses, no distension Ext:    No edema; adequate peripheral perfusion. Amputated fingers in right hand Skin:      Warm and dry; no rash Neuro: alert and oriented x 3 Psych: normal mood and affect  Data Reviewed: CT abdomen pelvis 08/28/16-bibasilar opacities, atelectasis Chest x-ray 08/28/16-coarse interstitial marking, nodular opacity in the left midlung region. Chest x-ray 09/10/17- stable coarse interstitial markings. High-resolution CT chest 09/15/17- bronchial wall thickening with mild emphysema.  Diffuse groundglass attenuation, septal thickening, bronchiectasis with moderate air trapping. I have  reviewed the images personally.  FENO 09/10/17- 9  PFTs 09/21/17 FVC 1.96 [79%], FEV1 1.64 [90%], F/F 84, TLC 73%, DLCO 55% Mild restriction with severe diffusion defect that corrects for alveolar volume.  Labs: 04/05/17-positive ANA, Ro antibody 1.4  Assessment:  Interstitial lung disease CT scan reviewed which shows nonspecific interstitial lung disease with air trapping.  She does not have any exposure suggestive of hypersensitivity pneumonitis.  She has been evaluated by Dr. Amil Amen in the past for positive ANA, Ro antibody and was told that there is no evidence of rheumatologic issues. She may need reevaluation by rheumatology for Sjogren's syndrome as she continues to have significant symptoms of dry mouth, dry eyes. Will recheck serologies for rheumatologic disease, hypersensitivity profile and 6 minute walk test.   Order echocardiogram for evaluation of pulmonary hypertension.  Emphysema CT shows mild emphysematous changes.  There is no evidence of obstruction on PFTs.  Will continue on Breo  Plan/Recommendations: - Continue Breo, albuterol - Serologies for connective tissue disease, echocardiogram, 6 minute walk test.   Marshell Garfinkel MD [facility] Pager [phone] 09/21/2017, 2:54 PM  CC: [name], MD  Addendum: Office note from Dr. Amil Amen, rheumatology dated 05/20/17 Evaluated for positive ANA, SSA. Thought to be false-positive in spite of having xerostomia.  No systemic immunosuppression reccomended as there are no extraglandular manifestation.  Marshell Garfinkel MD  Pulmonary and Critical Care Pager [phone] If no answer or after 3pm call: [phone] 10/08/2017, 4:49 PM

## 2017-09-21 NOTE — Patient Instructions (Addendum)
We will recheck blood test for interstitial lung disease including ANA with reflex, RF, CCP, Sjogren's panel, hypersensitivity pneumonitis, angiotensin-converting enzyme, sed rate, CRP.  Order echocardiogram to evaluate pulmonary hypertension. Follow-up in 1 month.

## 2017-09-21 NOTE — Progress Notes (Signed)
PFT completed today.  

## 2017-09-22 LAB — RESPIRATORY ALLERGY PROFILE REGION II ~~LOC~~
Allergen, D pternoyssinus,d7: <0.1 kU/L
Allergen, Mouse Urine Protein, e78: <0.1 kU/L
Allergen, Mulberry, t76: <0.1 kU/L
Allergen, Oak,t7: <0.1 kU/L
Aspergillus fumigatus, m3: <0.1 kU/L
Bermuda Grass: <0.1 kU/L
CLASS: 0
CLASS: 0
CLASS: 0
CLASS: 0
CLASS: 0
CLASS: 0
Class: 0
Class: 0
Class: 0
Class: 0
Class: 0
Class: 0
Class: 0
Class: 0
Class: 0
Class: 0
Class: 0
Class: 0
Class: 0
Class: 0
Class: 0
Class: 0
Class: 0
Class: 0
Cockroach: <0.1 kU/L
IGE (IMMUNOGLOBULIN E), SERUM: 4 kU/L (ref <=114)
Rough Pigweed  IgE: <0.1 kU/L
Sheep Sorrel IgE: <0.1 kU/L
Timothy Grass: <0.1 kU/L

## 2017-09-22 LAB — SJOGREN'S SYNDROME ANTIBODS(SSA + SSB)
SSA (RO) (ENA) ANTIBODY, IGG: POSITIVE AI — AB
SSB (La) (ENA) Antibody, IgG: <1 AI

## 2017-09-22 LAB — CYCLIC CITRUL PEPTIDE ANTIBODY, IGG: Cyclic Citrullin Peptide Ab: <16 UNITS

## 2017-09-22 LAB — RHEUMATOID FACTOR: Rhuematoid fact SerPl-aCnc: <14 IU/mL (ref <14)

## 2017-09-22 LAB — INTERPRETATION:

## 2017-09-22 LAB — ANGIOTENSIN CONVERTING ENZYME: ANGIOTENSIN-CONVERTING ENZYME: 86 U/L — AB (ref 9–67)

## 2017-09-24 ENCOUNTER — Ambulatory Visit (HOSPITAL_COMMUNITY): Payer: PPO | Attending: Cardiology

## 2017-09-24 ENCOUNTER — Other Ambulatory Visit: Payer: Self-pay

## 2017-09-24 DIAGNOSIS — I272 Pulmonary hypertension, unspecified: Secondary | ICD-10-CM | POA: Diagnosis not present

## 2017-09-24 DIAGNOSIS — I10 Essential (primary) hypertension: Secondary | ICD-10-CM | POA: Insufficient documentation

## 2017-09-24 DIAGNOSIS — Z9119 Patient's noncompliance with other medical treatment and regimen: Secondary | ICD-10-CM | POA: Insufficient documentation

## 2017-09-24 DIAGNOSIS — J849 Interstitial pulmonary disease, unspecified: Secondary | ICD-10-CM | POA: Diagnosis not present

## 2017-09-24 DIAGNOSIS — I519 Heart disease, unspecified: Secondary | ICD-10-CM | POA: Diagnosis not present

## 2017-09-24 DIAGNOSIS — G4733 Obstructive sleep apnea (adult) (pediatric): Secondary | ICD-10-CM | POA: Insufficient documentation

## 2017-09-24 DIAGNOSIS — E785 Hyperlipidemia, unspecified: Secondary | ICD-10-CM | POA: Insufficient documentation

## 2017-09-24 DIAGNOSIS — E119 Type 2 diabetes mellitus without complications: Secondary | ICD-10-CM | POA: Diagnosis not present

## 2017-09-24 LAB — ANA W/REFLEX: ANA: NEGATIVE

## 2017-09-24 LAB — HYPERSENSITIVITY PNEUMONITIS
A. PULLULANS ABS: NEGATIVE
A.Fumigatus #1 Abs: NEGATIVE
Micropolyspora faeni, IgG: NEGATIVE
Pigeon Serum Abs: NEGATIVE
THERMOACT. SACCHARII: NEGATIVE
THERMOACTINOMYCES VULGARIS IGG: NEGATIVE

## 2017-09-28 NOTE — Progress Notes (Signed)
LMTCB

## 2017-09-30 ENCOUNTER — Telehealth: Payer: Self-pay | Admitting: Pulmonary Disease

## 2017-09-30 NOTE — Telephone Encounter (Signed)
Notes recorded by Marshell Garfinkel, MD on 09/27/2017 at 4:51 PM EST Echo shows mild stiffening of the heart. Otherwise echo is normal. There is no evidence of pulmonary hypertension. ------------------------------------------------------------- Spoke with pt. She is aware of her results. Nothing further was needed.

## 2017-09-30 NOTE — Telephone Encounter (Signed)
Pt is returning call. Cb is [phone].

## 2017-09-30 NOTE — Telephone Encounter (Signed)
lmtcb x1 for pt. 

## 2017-10-07 DIAGNOSIS — E1140 Type 2 diabetes mellitus with diabetic neuropathy, unspecified: Secondary | ICD-10-CM | POA: Diagnosis not present

## 2017-10-07 DIAGNOSIS — M189 Osteoarthritis of first carpometacarpal joint, unspecified: Secondary | ICD-10-CM | POA: Diagnosis not present

## 2017-10-07 DIAGNOSIS — E119 Type 2 diabetes mellitus without complications: Secondary | ICD-10-CM | POA: Diagnosis not present

## 2017-10-07 DIAGNOSIS — J439 Emphysema, unspecified: Secondary | ICD-10-CM | POA: Diagnosis not present

## 2017-10-07 DIAGNOSIS — I10 Essential (primary) hypertension: Secondary | ICD-10-CM | POA: Diagnosis not present

## 2017-10-07 DIAGNOSIS — K219 Gastro-esophageal reflux disease without esophagitis: Secondary | ICD-10-CM | POA: Diagnosis not present

## 2017-10-07 DIAGNOSIS — G2581 Restless legs syndrome: Secondary | ICD-10-CM | POA: Diagnosis not present

## 2017-10-18 DIAGNOSIS — J441 Chronic obstructive pulmonary disease with (acute) exacerbation: Secondary | ICD-10-CM | POA: Diagnosis not present

## 2017-11-01 DIAGNOSIS — E119 Type 2 diabetes mellitus without complications: Secondary | ICD-10-CM | POA: Diagnosis not present

## 2017-11-01 DIAGNOSIS — M189 Osteoarthritis of first carpometacarpal joint, unspecified: Secondary | ICD-10-CM | POA: Diagnosis not present

## 2017-11-01 DIAGNOSIS — J449 Chronic obstructive pulmonary disease, unspecified: Secondary | ICD-10-CM | POA: Diagnosis not present

## 2017-11-01 DIAGNOSIS — E1140 Type 2 diabetes mellitus with diabetic neuropathy, unspecified: Secondary | ICD-10-CM | POA: Diagnosis not present

## 2017-11-01 DIAGNOSIS — Z794 Long term (current) use of insulin: Secondary | ICD-10-CM | POA: Diagnosis not present

## 2017-11-01 DIAGNOSIS — J441 Chronic obstructive pulmonary disease with (acute) exacerbation: Secondary | ICD-10-CM | POA: Diagnosis not present

## 2017-11-01 DIAGNOSIS — I10 Essential (primary) hypertension: Secondary | ICD-10-CM | POA: Diagnosis not present

## 2017-11-01 DIAGNOSIS — J439 Emphysema, unspecified: Secondary | ICD-10-CM | POA: Diagnosis not present

## 2017-11-02 ENCOUNTER — Ambulatory Visit: Payer: PPO | Admitting: Pulmonary Disease

## 2017-11-02 ENCOUNTER — Ambulatory Visit (INDEPENDENT_AMBULATORY_CARE_PROVIDER_SITE_OTHER): Payer: PPO | Admitting: *Deleted

## 2017-11-02 ENCOUNTER — Encounter: Payer: Self-pay | Admitting: Pulmonary Disease

## 2017-11-02 ENCOUNTER — Ambulatory Visit: Payer: PPO

## 2017-11-02 VITALS — BP 132/84 | HR 87 | Ht 62.0 in

## 2017-11-02 DIAGNOSIS — J439 Emphysema, unspecified: Secondary | ICD-10-CM

## 2017-11-02 DIAGNOSIS — Z794 Long term (current) use of insulin: Secondary | ICD-10-CM | POA: Diagnosis not present

## 2017-11-02 DIAGNOSIS — J849 Interstitial pulmonary disease, unspecified: Secondary | ICD-10-CM | POA: Diagnosis not present

## 2017-11-02 DIAGNOSIS — E1140 Type 2 diabetes mellitus with diabetic neuropathy, unspecified: Secondary | ICD-10-CM | POA: Diagnosis not present

## 2017-11-02 DIAGNOSIS — G2581 Restless legs syndrome: Secondary | ICD-10-CM | POA: Diagnosis not present

## 2017-11-02 NOTE — Progress Notes (Signed)
Cheryl Brown    [id number]    [birth year]  Primary Care Physician:Gates, Cheryl [place], MD  Referring Physician: [name], MD [address] [place], [place] [address]  Chief complaint: Follow up for dyspnea, interstitial lung disease.  HPI: 82 year old with history of hypertension, diabetes, COPD, OSA (noncompliant with CPAP].   She was diagnosed with COPD in 2012.  Has been maintained on Advair.  Reports worsening dyspnea on exertion for the past few months- year.  Has dyspnea with rest, chronic cough with white mucus, wheezing, occasional fevers and chills at night.  She does not have seasonal allergies. + for acid reflux and indigestion.   Seen by Dr. Inda Merlin last month and advair was switched to [facility].  She cannot tell if this is helping any more than Advair. Diagnosed with sleep apnea.  She has been intolerant of CPAP and is not interested in trying again. History noted for elevated ANA with positive ro antibody.  She was referred to Dr. Amil Amen in August 2018 but was told she did not have any rheumatologic issue.  She continues to have dry mouth, dry eyes.  Denies any joint pain, rash.  Pets: No pets Occupation: Retired from [employer] in 1979 Exposures: No known exposures, no mold, hot tub Smoking history: 30-pack-year smoking history.  Quit in 1988 Travel History: Lived in [place] all her life.  No significant travel history.  Interim history: Reports that dyspnea is unchanged.  She is off Breo but cannot tell if the breathing is any different.  She is had a CT scan and pulmonary function test and is here for review.  Outpatient Encounter Medications as of 11/02/2017  Medication Sig  . amitriptyline (ELAVIL) 50 MG tablet Take 50 mg by mouth at bedtime.  . BD PEN NEEDLE NANO U/F 32G X 4 MM MISC use as directed once daily  . fluticasone furoate-vilanterol (BREO ELLIPTA) 100-25 MCG/INH AEPB Inhale 1 puff into the lungs daily.  . furosemide (LASIX) 20 MG tablet  Take 1 tablet (20 mg total) by mouth daily. (Patient taking differently: Take 20 mg by mouth daily as needed for fluid. )  . glucose blood (ONE TOUCH ULTRA TEST) test strip 1 each by Other route daily. Use to check blood sugar daily Dx: E11.41  . Insulin Glargine (TOUJEO SOLOSTAR [place]) Inject 22 Units into the skin every morning.  [name] losartan (COZAAR) 50 MG tablet Take 50 mg by mouth daily.  [name] DELICA LANCETS MISC Check blood sugar as directed  . polyethylene glycol powder (MIRALAX) powder Take 34 g by mouth daily. 2 capfuls daily per Urologist   . pramipexole (MIRAPEX) 0.25 MG tablet Take 1 tablet (0.25 mg total) by mouth at bedtime.  [name] PREMARIN vaginal cream Place 1 application vaginally every other day.  . [DISCONTINUED] gabapentin (NEURONTIN) 100 MG capsule Take 3 capsules (300 mg total) by mouth 2 (two) times daily. (Patient not taking: Reported on 11/02/2017)   No facility-administered encounter medications on file as of 11/02/2017.     Allergies as of 11/02/2017 - Review Complete 11/02/2017  Allergen Reaction Noted  . Morphine and related Shortness Of Breath 12/23/2011  . Codeine    . Ipratropium-albuterol    . Niaspan [niacin]  02/16/2011  . Norvasc [amlodipine besylate]  10/15/2014  . Pramipexole dihydrochloride    . Pregabalin    . Propoxyphene n-acetaminophen    . Simvastatin    . Sulfonamide derivatives Itching  Past Medical History:  Diagnosis Date  . Blood transfusion   . COPD (chronic obstructive pulmonary disease) ([place])   . Diabetes mellitus   . Dysmetabolic syndrome   . Hyperlipemia   . Hypertension   . IBS (irritable bowel syndrome)   . Neoplasm of pituitary gland    Dr.Nudelman   . Palpitations   . Pituitary adenoma ([place]) 1991   Dr.Love and Dr.Nudelman   . Recurrent UTI 2013-14   Dr. Thomasene Mohair and Dr. Marvel Plan  . Reflux esophagitis   . Restless leg syndrome   . Scoliosis    Spinal Stenosis  . Shingles outbreak     Past Surgical History:    Procedure Laterality Date  . ANKLE FRACTURE SURGERY     left ankle  . BREAST LUMPECTOMY     4 lumps removed from breast, all benign   . CHOLECYSTECTOMY    . COLONOSCOPY  2014   [facility], [facility]  . CYSTOSCOPY  2014    Dr Thomasene Mohair  . ESOPHAGEAL DILATION  01/2003  . FINGER AMPUTATION     related to work injury   . g2 p2    . KNEE SURGERY  16/1096    complicated by difficult resuscitation post anesthesia   . LAPAROSCOPIC CHOLECYSTECTOMY  03/2011   [facility] [place]     for dysfunctional bleeding   . pituitary adenoma     Dr. Erling Cruz  . UPPER GI ENDOSCOPY  2014   gastric polyp  . URETHRAL DILATION  2014    Family History  Problem Relation Age of Onset  . Alzheimer's disease Mother   . Arthritis Mother   . Mental illness Mother        alzheimers  . Bone cancer Father   . Prostate cancer Father   . Diabetes Father   . Coronary artery disease Father   . Cancer Father        bone, prostate  . Alzheimer's disease Maternal Aunt   . Mental illness Maternal Aunt        alzheimers  . Mitral valve prolapse Sister   . Alzheimer's disease Sister   . Hypertension Sister   . Diabetes Sister   . Stroke Paternal Grandmother   . Osteoporosis Sister   . Mental illness Maternal Grandmother        alzheimers    Social History   Socioeconomic History  . Marital status: Widowed    Spouse name: Not on file  . Number of children: 2  . Years of education: 9th  . Highest education level: Not on file  Occupational History  . Occupation: Retired  Scientific laboratory technician  . Financial resource strain: Not on file  . Food insecurity:    Worry: Not on file    Inability: Not on file  . Transportation needs:    Medical: Not on file    Non-medical: Not on file  Tobacco Use  . Smoking status: Former Smoker    Packs/day: 0.50    Years: 25.00    Pack years: 12.50    Types: Cigarettes    Last attempt to quit: 08/10/1978    Years since quitting: 39.2  . Smokeless tobacco:  Never Used  Substance and Sexual Activity  . Alcohol use: No  . Drug use: No  . Sexual activity: Not on file  Lifestyle  . Physical activity:    Days per week: Not on file    Minutes per session: Not on file  .  Stress: Not on file  Relationships  . Social connections:    Talks on phone: Not on file    Gets together: Not on file    Attends religious service: Not on file    Active member of club or organization: Not on file    Attends meetings of clubs or organizations: Not on file    Relationship status: Not on file  . Intimate partner violence:    Fear of current or ex partner: Not on file    Emotionally abused: Not on file    Physically abused: Not on file    Forced sexual activity: Not on file  Other Topics Concern  . Not on file  Social History Narrative   Lives at home with her husband.   Right-handed.   No caffeine use.    Review of systems: Review of Systems  Constitutional: Negative for fever and chills.  HENT: Negative.   Eyes: Negative for blurred vision.  Respiratory: as per HPI  Cardiovascular: Negative for chest pain and palpitations.  Gastrointestinal: Negative for vomiting, diarrhea, blood per rectum. Genitourinary: Negative for dysuria, urgency, frequency and hematuria.  Musculoskeletal: Negative for myalgias, back pain and joint pain.  Skin: Negative for itching and rash.  Neurological: Negative for dizziness, tremors, focal weakness, seizures and loss of consciousness.  Endo/Heme/Allergies: Negative for environmental allergies.  Psychiatric/Behavioral: Negative for depression, suicidal ideas and hallucinations.  All other systems reviewed and are negative.  Physical Exam: Blood pressure 132/84, pulse 87, height 5\' 2"  (1.575 m), SpO2 95 %. Gen:      No acute distress HEENT:  EOMI, sclera anicteric Neck:     No masses; no thyromegaly Lungs:    Clear to auscultation bilaterally; normal respiratory effort CV:         Regular rate and rhythm; no  murmurs Abd:      + bowel sounds; soft, non-tender; no palpable masses, no distension Ext:    No edema; adequate peripheral perfusion Skin:      Warm and dry; no rash Neuro: alert and oriented x 3 Psych: normal mood and affect  Data Reviewed: CT abdomen pelvis 08/28/16-bibasilar opacities, atelectasis Chest x-ray 08/28/16-coarse interstitial marking, nodular opacity in the left midlung region. Chest x-ray 09/10/17- stable coarse interstitial markings. High-resolution CT chest 09/15/17- bronchial wall thickening with mild emphysema.  Diffuse groundglass attenuation, septal thickening, bronchiectasis with moderate air trapping. I have reviewed the images personally.  FENO 09/10/17- 9  PFTs 09/21/17 FVC 1.96 [79%], FEV1 1.64 [90%], F/F 84, TLC 73%, DLCO 55% Mild restriction with severe diffusion defect that corrects for alveolar volume.  6-minute walk test 11/02/17-  Starting heart rate, sats-69, 93% Starting heart rate, sats-108, 94% Distance walked 408,  Patient stopped with 30 seconds left due to SOB and leg cramps.  Labs: 04/05/17-positive ANA, Ro antibody 1.4  Repeat labs 09/21/17 Blood allergy profile-negative, IgE 4 ILD panel-ANA negative, angiotensin-converting enzyme-86 Rheumatoid factor, CCP-negative Ro antibody 1.1 La negative Hypersensitivity panel-negative  Echocardiogram 09/24/17- Normal LV size with mild LV hypertrophy. EF 55-60%. Normal RV size and systolic function. No significant valvular abnormalities. PA peak pressure 31  Assessment:  Interstitial lung disease CT scan reviewed which shows nonspecific interstitial lung disease with air trapping.  She does not have any exposure suggestive of hypersensitivity pneumonitis.  She has been evaluated by Dr. Amil Amen in the past for positive ANA, Ro antibody and was told that there is no evidence of rheumatologic issues.   Repeat ILD panel noted for mild elevation  in ACE level and persistent positive Ro antibody.  Since her  symptoms are mild we will continue to observe this.  If there is worsening then consider bronchoscopy and reevaluation by rheumatology for Sjogren's syndrome. Order follow-up PFTs, had a CT and 6-minute walk test in 6 months  Echo reviewed with no evidence of pulmonary hypertension.  GERD She has ongoing GERD and may have reflux contributing to ILD.  She is on Prilosec once a day.  I have asked her to increase it to 20 mg twice daily.  Emphysema CT shows mild emphysematous changes.  There is no evidence of obstruction on PFTs.  Will continue on Breo  Plan/Recommendations: - Continue Breo, albuterol - Follow-up PFTs, CT and 6-minute walk test - Increase Prilosec to twice daily.  Marshell Garfinkel MD [facility] Pager [phone] 11/02/2017, 10:02 AM  CC: [name], MD  Addendum: Office note from Dr. Amil Amen, rheumatology dated 05/20/17 Evaluated for positive ANA, SSA. Thought to be false-positive in spite of having xerostomia.  No systemic immunosuppression reccomended as there are no extraglandular manifestation.  Marshell Garfinkel MD [facility] Pager [phone] If no answer or after 3pm call: [phone] 11/02/2017, 10:02 AM

## 2017-11-02 NOTE — Progress Notes (Signed)
   SIX MIN WALK 11/02/2017  Medications None  Supplimental Oxygen during Test? (L/min) No  Laps 8  Partial Lap (in Meters) 24  Baseline BP (sitting) 116/74  Baseline Heartrate 69  Baseline Dyspnea (Borg Scale) 1  Baseline Fatigue (Borg Scale) 4  Baseline SPO2 93  BP (sitting) 134/82  Heartrate 108  Dyspnea (Borg Scale) 2  Fatigue (Borg Scale) 2  SPO2 94  BP (sitting) 130/82  Heartrate 91  SPO2 92  Stopped or Paused before Six Minutes Yes  Other Symptoms at end of Exercise Patient stopped with 30 seconds left due to SOB and leg cramps.   Interpretation Leg pain  Distance Completed 408  Tech Comments: Patient was able to complete 8.5 laps before stopping. She did feel SOB after stopping. Her mouth was also dry due to her being a mouth breather. Did not have to use oxygen during test at all.

## 2017-11-02 NOTE — Patient Instructions (Addendum)
I have reviewed your test today You have mild emphysema which is a form of COPD.  Continue the Brio We also have a mild interstitial lung disease which could be from acid reflux or Sjogren's You are using over-the-counter Prilosec.  Increase it to twice a day We will keep a close watch on this.  I do not recommend any aggressive treatment for now as it appears to be mild Will follow up with spirometry and diffusion capacity, 6-minute walk test in 6 months High-resolution CT in 6 months Follow-up in clinic after the stress.

## 2017-11-04 ENCOUNTER — Ambulatory Visit: Payer: PPO | Admitting: Neurology

## 2017-12-28 DIAGNOSIS — J441 Chronic obstructive pulmonary disease with (acute) exacerbation: Secondary | ICD-10-CM | POA: Diagnosis not present

## 2017-12-28 DIAGNOSIS — M189 Osteoarthritis of first carpometacarpal joint, unspecified: Secondary | ICD-10-CM | POA: Diagnosis not present

## 2017-12-28 DIAGNOSIS — M19039 Primary osteoarthritis, unspecified wrist: Secondary | ICD-10-CM | POA: Diagnosis not present

## 2017-12-28 DIAGNOSIS — I10 Essential (primary) hypertension: Secondary | ICD-10-CM | POA: Diagnosis not present

## 2017-12-28 DIAGNOSIS — J439 Emphysema, unspecified: Secondary | ICD-10-CM | POA: Diagnosis not present

## 2017-12-28 DIAGNOSIS — E1140 Type 2 diabetes mellitus with diabetic neuropathy, unspecified: Secondary | ICD-10-CM | POA: Diagnosis not present

## 2017-12-28 DIAGNOSIS — E119 Type 2 diabetes mellitus without complications: Secondary | ICD-10-CM | POA: Diagnosis not present

## 2017-12-28 DIAGNOSIS — Z794 Long term (current) use of insulin: Secondary | ICD-10-CM | POA: Diagnosis not present

## 2018-01-06 DIAGNOSIS — J449 Chronic obstructive pulmonary disease, unspecified: Secondary | ICD-10-CM | POA: Diagnosis not present

## 2018-01-06 DIAGNOSIS — E1140 Type 2 diabetes mellitus with diabetic neuropathy, unspecified: Secondary | ICD-10-CM | POA: Diagnosis not present

## 2018-01-06 DIAGNOSIS — R600 Localized edema: Secondary | ICD-10-CM | POA: Diagnosis not present

## 2018-01-06 DIAGNOSIS — I10 Essential (primary) hypertension: Secondary | ICD-10-CM | POA: Diagnosis not present

## 2018-01-06 DIAGNOSIS — E119 Type 2 diabetes mellitus without complications: Secondary | ICD-10-CM | POA: Diagnosis not present

## 2018-01-06 DIAGNOSIS — Z13820 Encounter for screening for osteoporosis: Secondary | ICD-10-CM | POA: Diagnosis not present

## 2018-01-06 DIAGNOSIS — G2581 Restless legs syndrome: Secondary | ICD-10-CM | POA: Diagnosis not present

## 2018-01-06 DIAGNOSIS — M19039 Primary osteoarthritis, unspecified wrist: Secondary | ICD-10-CM | POA: Diagnosis not present

## 2018-03-04 ENCOUNTER — Ambulatory Visit: Payer: PPO | Admitting: Podiatry

## 2018-03-04 ENCOUNTER — Encounter: Payer: Self-pay | Admitting: Podiatry

## 2018-03-04 ENCOUNTER — Ambulatory Visit (INDEPENDENT_AMBULATORY_CARE_PROVIDER_SITE_OTHER): Payer: PPO

## 2018-03-04 VITALS — BP 147/83 | HR 67 | Resp 16

## 2018-03-04 DIAGNOSIS — L84 Corns and callosities: Secondary | ICD-10-CM

## 2018-03-04 DIAGNOSIS — R350 Frequency of micturition: Secondary | ICD-10-CM | POA: Insufficient documentation

## 2018-03-04 DIAGNOSIS — M2011 Hallux valgus (acquired), right foot: Secondary | ICD-10-CM

## 2018-03-04 DIAGNOSIS — M2012 Hallux valgus (acquired), left foot: Secondary | ICD-10-CM

## 2018-03-04 DIAGNOSIS — B373 Candidiasis of vulva and vagina: Secondary | ICD-10-CM | POA: Insufficient documentation

## 2018-03-04 DIAGNOSIS — L900 Lichen sclerosus et atrophicus: Secondary | ICD-10-CM | POA: Insufficient documentation

## 2018-03-04 DIAGNOSIS — B3731 Acute candidiasis of vulva and vagina: Secondary | ICD-10-CM | POA: Insufficient documentation

## 2018-03-04 DIAGNOSIS — M779 Enthesopathy, unspecified: Secondary | ICD-10-CM

## 2018-03-04 NOTE — Progress Notes (Signed)
   Subjective:    Patient ID: Cheryl Brown, female    DOB: 1935-09-11, 82 y.o.   MRN: [id number]  HPI    Review of Systems  All other systems reviewed and are negative.      Objective:   Physical Exam        Assessment & Plan:

## 2018-03-04 NOTE — Patient Instructions (Addendum)
Bunion A bunion is a bump on the base of the big toe that forms when the bones of the big toe joint move out of position. Bunions may be small at first, but they often get larger over time. The can make walking painful. What are the causes? A bunion may be caused by:  Wearing narrow or pointed shoes that force the big toe to press against the other toes.  Abnormal foot development that causes the foot to roll inward (pronate).  Changes in the foot that are caused by certain diseases, such as rheumatoid arthritis and polio.  A foot injury.  What increases the risk? The following factors may make you more likely to develop this condition:  Wearing shoes that squeeze the toes together.  Having certain diseases, such as: ? Rheumatoid arthritis. ? Polio. ? Cerebral palsy.  Having family members who have bunions.  Being born with a foot deformity, such as flat feet or low arches.  Doing activities that put a lot of pressure on the feet, such as ballet dancing.  What are the signs or symptoms? The main symptom of a bunion is a noticeable bump on the big toe. Other symptoms may include:  Pain.  Swelling around the big toe.  Redness and inflammation.  Thick or hardened skin on the big toe or between the toes.  Stiffness or loss of motion in the big toe.  Trouble with walking.  How is this diagnosed? A bunion may be diagnosed based on your symptoms, medical history, and activities. You may have tests, such as:  X-rays. These allow your health care provider to check the position of the bones in your foot and look for damage to your joint. They also help your health care provider to determine the severity of your bunion and the best way to treat it.  Joint aspiration. In this test, a sample of fluid is removed from the toe joint. This test, which may be done if you are in a lot of pain, helps to rule out diseases that cause painful swelling of the joints, such as  arthritis.  How is this treated? There is no cure for a bunion, but treatment can help to prevent a bunion from getting worse. Treatment depends on the severity of your symptoms. Your health care provider may recommend:  Wearing shoes that have a wide toe box.  Using bunion pads to cushion the affected area.  Taping your toes together to keep them in a normal position.  Placing a device inside your shoe (orthotics) to help reduce pressure on your toe joint.  Taking medicine to ease pain, inflammation, and swelling.  Applying heat or ice to the affected area.  Doing stretching exercises.  Surgery to remove scar tissue and move the toes back into their normal position. This treatment is rare.  Follow these instructions at home:  Support your toe joint with proper footwear, shoe padding, or taping as told by your health care provider.  Take over-the-counter and prescription medicines only as told by your health care provider.  If directed, apply ice to the injured area: ? Put ice in a plastic bag. ? Place a towel between your skin and the bag. ? Leave the ice on for 20 minutes, 2-3 times per day.  If directed, apply heat to the affected area before you exercise. Use the heat source that your health care provider recommends, such as a moist heat pack or a heating pad. ? Place a towel between your   skin and the heat source. ? Leave the heat on for 20-30 minutes. ? Remove the heat if your skin turns bright red. This is especially important if you are unable to feel pain, heat, or cold. You may have a greater risk of getting burned.  Do exercises as told by your health care provider.  Keep all follow-up visits as told by your health care provider. Contact a health care provider if:  Your symptoms get worse.  Your symptoms do not improve in 2 weeks. Get help right away if:  You have severe pain and trouble with walking. This information is not intended to replace advice given  to you by your health care provider. Make sure you discuss any questions you have with your health care provider. Document Released: 07/27/2005 Document Revised: 01/02/2016 Document Reviewed: 02/24/2015 Elsevier Interactive Patient Education  2018 Elsevier Inc[employer]  Pre-Operative Instructions  Congratulations, you have decided to take an important step towards improving your quality of life.  You can be assured that the doctors and staff at [facility] will be with you every step of the way.  Here are some important things you should know:  1. Plan to be at the surgery center/hospital at least 1 (one) hour prior to your scheduled time, unless otherwise directed by the surgical center/hospital staff.  You must have a responsible adult accompany you, remain during the surgery and drive you home.  Make sure you have directions to the surgical center/hospital to ensure you arrive on time. 2. If you are having surgery at [facility], you will need a copy of your medical history and physical form from your family physician within one month prior to the date of surgery. We will give you a form for your primary physician to complete.  3. We make every effort to accommodate the date you request for surgery.  However, there are times where surgery dates or times have to be moved.  We will contact you as soon as possible if a change in schedule is required.   4. No aspirin/ibuprofen for one week before surgery.  If you are on aspirin, any non-steroidal anti-inflammatory medications (Mobic, Aleve, Ibuprofen) should not be taken seven (7) days prior to your surgery.  You make take Tylenol for pain prior to surgery.  5. Medications - If you are taking daily heart and blood pressure medications, seizure, reflux, allergy, asthma, anxiety, pain or diabetes medications, make sure you notify the surgery center/hospital before the day of surgery so they can tell you which medications you should  take or avoid the day of surgery. 6. No food or drink after midnight the night before surgery unless directed otherwise by surgical center/hospital staff. 7. No alcoholic beverages 24-hours prior to surgery.  No smoking 24-hours prior or 24-hours after surgery. 8. Wear loose pants or shorts. They should be loose enough to fit over bandages, boots, and casts. 9. Don't wear slip-on shoes. Sneakers are preferred. 10. Bring your boot with you to the surgery center/hospital.  Also bring crutches or a walker if your physician has prescribed it for you.  If you do not have this equipment, it will be provided for you after surgery. 11. If you have not been contacted by the surgery center/hospital by the day before your surgery, call to confirm the date and time of your surgery. 12. Leave-time from work may vary depending on the type of surgery you have.  Appropriate arrangements should be made prior   to surgery with your employer. 13. Prescriptions will be provided immediately following surgery by your doctor.  Fill these as soon as possible after surgery and take the medication as directed. Pain medications will not be refilled on weekends and must be approved by the doctor. 14. Remove nail polish on the operative foot and avoid getting pedicures prior to surgery. 15. Wash the night before surgery.  The night before surgery wash the foot and leg well with water and the antibacterial soap provided. Be sure to pay special attention to beneath the toenails and in between the toes.  Wash for at least three (3) minutes. Rinse thoroughly with water and dry well with a towel.  Perform this wash unless told not to do so by your physician.  Enclosed: 1 Ice pack (please put in freezer the night before surgery)   1 Hibiclens skin cleaner   Pre-op instructions  If you have any questions regarding the instructions, please do not hesitate to call our office.  [place]: [address], [place], [place] [address] --  [phone]  [place]: [address] [place], [place] [address] -- [phone]  [place]: [address]  [place], [place] [address] -- [phone]  [place]: [address], [place], [place] [address] -- [phone]  Website: [email or web address]  

## 2018-03-05 NOTE — Progress Notes (Signed)
Subjective:   Patient ID: Cheryl Brown, female   DOB: 82 y.o.   MRN: [id number]   HPI Patient presents with a lot of pain in the right bunion site and states that she is been trying wider shoes has tried soaking it and padding without relief and she knows she needs correction.  Has a keratotic lesion that is on the first metatarsal that is painful and also inflammation of the joint making it hard to be ambulatory.  Patient does not smoke likes to be active and sugar has been under good control with her a A1c at 8 and her sugars typically running between 101 120 and states the A1c is coming down.   Review of Systems  All other systems reviewed and are negative.       Objective:  Physical Exam  Constitutional: She appears well-developed and well-nourished.  Cardiovascular: Intact distal pulses.  Pulmonary/Chest: Effort normal.  Musculoskeletal: Normal range of motion.  Neurological: She is alert.  Skin: Skin is warm.  Nursing note and vitals reviewed.   Neurovascular status found to be intact muscle strength is adequate range of motion was within normal limits.  Patient is noted to have a large structural bunion deformity right with redness around the first metatarsal head and lesion formation.  Patient has a lot of pain associated with this and is tried numerous conservative treatments to try to help but.  Has good digital perfusion well oriented x3     Assessment:  Structural HAV deformity right with pain and callus formation around the first metatarsal secondary to pressure     Plan:  H&P condition reviewed and due to the intensity of discomfort patient wants surgical intervention.  I explained risk associate with diabetes but she is under good control with good circulatory status and I do think she would do well with this she is not interested in any other forms of treatment.  I did go ahead today and I allowed her to go over consent form going over all possible complications and  alternative treatments.  Patient is willing to accept the risk of surgery understanding all risks and at this time signed consent form after extensive review and is scheduled for outpatient surgery.  I did dispense a surgical CAM Walker as I want her to get used to it prior to surgery I also think it will help to reduce the inflammation in her foot prior to the procedure which will be performed in approximately 3 to 4 weeks.  She is encouraged to call us with any questions she may have and she understands total recovery will take 6 months to 1 year  X-ray indicates that there is significant structural bunion deformity right over left with deviation of the big toe of a moderate nature

## 2018-03-09 DIAGNOSIS — G2581 Restless legs syndrome: Secondary | ICD-10-CM | POA: Diagnosis not present

## 2018-03-09 DIAGNOSIS — M19039 Primary osteoarthritis, unspecified wrist: Secondary | ICD-10-CM | POA: Diagnosis not present

## 2018-03-09 DIAGNOSIS — I10 Essential (primary) hypertension: Secondary | ICD-10-CM | POA: Diagnosis not present

## 2018-03-09 DIAGNOSIS — E1140 Type 2 diabetes mellitus with diabetic neuropathy, unspecified: Secondary | ICD-10-CM | POA: Diagnosis not present

## 2018-03-09 DIAGNOSIS — R600 Localized edema: Secondary | ICD-10-CM | POA: Diagnosis not present

## 2018-03-09 DIAGNOSIS — Z13820 Encounter for screening for osteoporosis: Secondary | ICD-10-CM | POA: Diagnosis not present

## 2018-03-09 DIAGNOSIS — Z794 Long term (current) use of insulin: Secondary | ICD-10-CM | POA: Diagnosis not present

## 2018-03-09 DIAGNOSIS — J449 Chronic obstructive pulmonary disease, unspecified: Secondary | ICD-10-CM | POA: Diagnosis not present

## 2018-03-09 DIAGNOSIS — E119 Type 2 diabetes mellitus without complications: Secondary | ICD-10-CM | POA: Diagnosis not present

## 2018-03-18 ENCOUNTER — Telehealth: Payer: Self-pay | Admitting: Podiatry

## 2018-03-18 NOTE — Telephone Encounter (Signed)
I called and spoke with pt's husband, Cheryl Brown letting him know that [name] will not call Cheryl Brown until 24 - 48 hours before the day of her surgery. I told him they wait until then because they have to see if they have any changes in their schedules or if they have an pt's that are diabetic and/or are kids that would need to go first.

## 2018-03-18 NOTE — Telephone Encounter (Signed)
I'm scheduled to have foot surgery with Dr. Paulla Dolly on 27 August. He said someone from the surgical center would call me. No one has called me yet to let me know the time of my surgery. My phone number is [phone]. So if you could give me a call and let me know when the surgical center normally calls to set up the scheduled time. Thank you.

## 2018-03-29 DIAGNOSIS — M8588 Other specified disorders of bone density and structure, other site: Secondary | ICD-10-CM | POA: Diagnosis not present

## 2018-04-07 ENCOUNTER — Ambulatory Visit: Payer: PPO | Admitting: Pulmonary Disease

## 2018-04-07 ENCOUNTER — Encounter: Payer: Self-pay | Admitting: Pulmonary Disease

## 2018-04-07 VITALS — BP 142/80 | HR 76 | Ht 62.0 in | Wt 203.0 lb

## 2018-04-07 DIAGNOSIS — I10 Essential (primary) hypertension: Secondary | ICD-10-CM | POA: Diagnosis not present

## 2018-04-07 DIAGNOSIS — J449 Chronic obstructive pulmonary disease, unspecified: Secondary | ICD-10-CM | POA: Diagnosis not present

## 2018-04-07 DIAGNOSIS — M189 Osteoarthritis of first carpometacarpal joint, unspecified: Secondary | ICD-10-CM | POA: Diagnosis not present

## 2018-04-07 DIAGNOSIS — J439 Emphysema, unspecified: Secondary | ICD-10-CM | POA: Diagnosis not present

## 2018-04-07 DIAGNOSIS — R0602 Shortness of breath: Secondary | ICD-10-CM | POA: Diagnosis not present

## 2018-04-07 DIAGNOSIS — E1140 Type 2 diabetes mellitus with diabetic neuropathy, unspecified: Secondary | ICD-10-CM | POA: Diagnosis not present

## 2018-04-07 DIAGNOSIS — J849 Interstitial pulmonary disease, unspecified: Secondary | ICD-10-CM

## 2018-04-07 DIAGNOSIS — E119 Type 2 diabetes mellitus without complications: Secondary | ICD-10-CM | POA: Diagnosis not present

## 2018-04-07 DIAGNOSIS — M19039 Primary osteoarthritis, unspecified wrist: Secondary | ICD-10-CM | POA: Diagnosis not present

## 2018-04-07 MED ORDER — FLUTICASONE-UMECLIDIN-VILANT 100-62.5-25 MCG/INH IN AEPB: 1.0000 | INHALATION_SPRAY | Freq: Every day | RESPIRATORY_TRACT | 5 refills | Status: AC

## 2018-04-07 MED ORDER — PREDNISONE 10 MG PO TABS: ORAL_TABLET | ORAL | 0 refills | Status: DC

## 2018-04-07 NOTE — Patient Instructions (Signed)
Stop the Breo and change it to trelegy inhaler We will make sure that your repeat PFTs and high-resolution CT are done sooner since you are more short of breath We will give you a prednisone taper starting at 40 mg.  Reduce dose by 10 mg every 3 days Follow-up in 1 to 2 weeks after tests.

## 2018-04-07 NOTE — Progress Notes (Signed)
Cheryl Brown    [id number]    1936/01/10  Primary Care Physician:Gates, Cheryl [place], MD  Referring Physician: [name], MD [address] [place], [place] [address]  Chief complaint: Follow up for dyspnea, interstitial lung disease.  HPI: 82 year old with history of hypertension, diabetes, COPD, OSA (noncompliant with CPAP].   She was diagnosed with COPD in 2012.  Has been maintained on Advair.  Reports worsening dyspnea on exertion for the past few months- year.  Has dyspnea with rest, chronic cough with white mucus, wheezing, occasional fevers and chills at night.  She does not have seasonal allergies. + for acid reflux and indigestion.   Diagnosed with sleep apnea.  She has been intolerant of CPAP and is not interested in trying again. History noted for elevated ANA with positive ro antibody.  She was referred to Dr. Amil Brown in August 2018 but was told she did not have any rheumatologic issue.  She continues to have dry mouth, dry eyes.  Denies any joint pain, rash.  Office note from Dr. Amil Brown, rheumatology dated 05/20/17 Evaluated for positive ANA, SSA. Thought to be false-positive in spite of having xerostomia.  No systemic immunosuppression reccomended as there are no extraglandular manifestation.  Pets: No pets Occupation: Retired from [employer] in 1979 Exposures: No known exposures, no mold, hot tub Smoking history: 30-pack-year smoking history.  Quit in 1988 Travel History: Lived in [place] all her life.  No significant travel history.  Interim history: Continues on Breo.  States that she has had gradually worsening dyspnea over the past few months Scheduled for a bunion repair on rt foot on 8/27 but was canceled as anesthetist noted dyspnea, hypoxia She is here for evaluation for worsening symptoms.   Physical Exam: Blood pressure (!) 142/80, pulse 76, height 5\' 2"  (1.575 m), weight 203 lb (92.1 kg), SpO2 92 %. Gen:      No acute distress HEENT:   EOMI, sclera anicteric Neck:     No masses; no thyromegaly Lungs:    Clear to auscultation bilaterally; normal respiratory effort CV:         Regular rate and rhythm; no murmurs Abd:      + bowel sounds; soft, non-tender; no palpable masses, no distension Ext:    No edema; adequate peripheral perfusion Skin:      Warm and dry; no rash Neuro: alert and oriented x 3 Psych: normal mood and affect  Data Reviewed: Imaging CT abdomen pelvis 08/28/16-bibasilar opacities, atelectasis Chest x-ray 08/28/16-coarse interstitial marking, nodular opacity in the left midlung region. Chest x-ray 09/10/17- stable coarse interstitial markings. High-resolution CT chest 09/15/17- bronchial wall thickening with mild emphysema.  Diffuse groundglass attenuation, septal thickening, bronchiectasis with moderate air trapping. I have reviewed the images personally.  PFTs  09/21/17 FVC 1.96 [79%], FEV1 1.64 [90%], F/F 84, TLC 73%, DLCO 55% Mild restriction with severe diffusion defect that corrects for alveolar volume.  FENO 09/10/17- 9  6-minute walk test 11/02/17-  Starting heart rate, sats-69, 93% Starting heart rate, sats-108, 94% Distance walked 408,  Patient stopped with 30 seconds left due to SOB and leg cramps.  Labs: 04/05/17-positive ANA, Ro antibody 1.4  Repeat labs 09/21/17 Blood allergy profile-negative, IgE 4 ILD panel-ANA negative, angiotensin-converting enzyme-86 Rheumatoid factor, CCP-negative Ro antibody 1.1 La negative Hypersensitivity panel-negative  Cardiac Echocardiogram 09/24/17- Normal LV size with mild LV hypertrophy. EF 55-60%. Normal RV size and systolic function. No significant valvular abnormalities. PA peak pressure 31  Assessment:  Emphysema CT shows mild emphysematous changes.  There is no evidence of obstruction on PFTs.  Seen in office today for worsening symptoms. Noted to have hypoxia to 87% on exertion.  She does not want to start supplemental oxygen  We will  reevaluate with repeat PFTs, 6-minute walk test Stop Breo and start trelegy inhaler Give prednisone taper starting at 40 mg.  Reduce dose by 10 mg every 3 days.   Interstitial lung disease CT scan reviewed which shows nonspecific interstitial lung disease with air trapping.  She does not have any exposure suggestive of hypersensitivity pneumonitis.  She has been evaluated by Dr. Amil Brown in the past for positive ANA, Ro antibody and was told that there is no evidence of rheumatologic issues. Repeat ILD panel noted for mild elevation in ACE level and persistent positive Ro antibody.    Get high-resolution CT to evaluate if she has worsening ILD. Previous echo reviewed with no evidence of pulmonary hypertension.  GERD She has ongoing GERD and may have reflux contributing to ILD.   Continue prilosec  Plan/Recommendations: - Stop Breo, start trelegy - Follow-up CT, PFTs, 6-minute walk. - Pred taper starting at 40 mg.  Reduce dose by 10 mg every 3 days.   Outpatient Encounter Medications as of 04/07/2018  Medication Sig  . amitriptyline (ELAVIL) 50 MG tablet Take 50 mg by mouth at bedtime.  . BD PEN NEEDLE NANO U/F 32G X 4 MM MISC use as directed once daily  . fluticasone furoate-vilanterol (BREO ELLIPTA) 100-25 MCG/INH AEPB Inhale 1 puff into the lungs daily.  . furosemide (LASIX) 20 MG tablet Take 1 tablet (20 mg total) by mouth daily. (Patient taking differently: Take 20 mg by mouth daily as needed for fluid. )  . glucose blood (ONE TOUCH ULTRA TEST) test strip 1 each by Other route daily. Use to check blood sugar daily Dx: E11.41  . Insulin Glargine (TOUJEO SOLOSTAR Cheryl Brown) Inject 22 Units into the skin every morning.  [name] losartan (COZAAR) 50 MG tablet Take 50 mg by mouth daily.  [name] DELICA LANCETS MISC Check blood sugar as directed  . polyethylene glycol powder (MIRALAX) powder Take 34 g by mouth daily. 2 capfuls daily per Urologist   . pramipexole (MIRAPEX) 0.25 MG tablet Take 1 tablet  (0.25 mg total) by mouth at bedtime.  [name] PREMARIN vaginal cream Place 1 application vaginally every other day.   No facility-administered encounter medications on file as of 04/07/2018.     Allergies as of 04/07/2018 - Review Complete 04/07/2018  Allergen Reaction Noted  . Morphine and related Shortness Of Breath 12/23/2011  . Codeine    . Ipratropium-albuterol    . Neosporin [neomycin-bacitracin zn-polymyx]  03/04/2018  . Niaspan [niacin]  02/16/2011  . Nitrofurantoin macrocrystal Nausea Only 03/04/2018  . Norvasc [amlodipine besylate]  10/15/2014  . Pramipexole dihydrochloride    . Pregabalin    . Propoxyphene n-acetaminophen    . Simvastatin    . Sulfa antibiotics  03/04/2018  . Sulfonamide derivatives Itching     Past Medical History:  Diagnosis Date  . Blood transfusion   . COPD (chronic obstructive pulmonary disease) ([place])   . Diabetes mellitus   . Dysmetabolic syndrome   . Hyperlipemia   . Hypertension   . IBS (irritable bowel syndrome)   . Neoplasm of pituitary gland    Dr.Nudelman   . Palpitations   . Pituitary adenoma ([place]) 1991   Dr.Love and Dr.Nudelman   . Recurrent UTI 2013-14  Dr. Thomasene Mohair and Dr. Marvel Plan  . Reflux esophagitis   . Restless leg syndrome   . Scoliosis    Spinal Stenosis  . Shingles outbreak     Past Surgical History:  Procedure Laterality Date  . ANKLE FRACTURE SURGERY     left ankle  . BREAST LUMPECTOMY     4 lumps removed from breast, all benign   . CHOLECYSTECTOMY    . COLONOSCOPY  2014   [facility], [facility]  . CYSTOSCOPY  2014    Dr Thomasene Mohair  . ESOPHAGEAL DILATION  01/2003  . FINGER AMPUTATION     related to work injury   . g2 p2    . KNEE SURGERY  [id number]/7628    complicated by difficult resuscitation post anesthesia   . LAPAROSCOPIC CHOLECYSTECTOMY  03/2011   [facility], [place]     for dysfunctional bleeding   . pituitary adenoma     Dr. Erling Cruz  . UPPER GI ENDOSCOPY  2014   gastric polyp  .  URETHRAL DILATION  2014    Family History  Problem Relation Age of Onset  . Alzheimer's disease Mother   . Arthritis Mother   . Mental illness Mother        alzheimers  . Bone cancer Father   . Prostate cancer Father   . Diabetes Father   . Coronary artery disease Father   . Cancer Father        bone, prostate  . Alzheimer's disease Maternal Aunt   . Mental illness Maternal Aunt        alzheimers  . Mitral valve prolapse Sister   . Alzheimer's disease Sister   . Hypertension Sister   . Diabetes Sister   . Stroke Paternal Grandmother   . Osteoporosis Sister   . Mental illness Maternal Grandmother        alzheimers    Social History   Socioeconomic History  . Marital status: Widowed    Spouse name: Not on file  . Number of children: 2  . Years of education: 9th  . Highest education level: Not on file  Occupational History  . Occupation: Retired  Scientific laboratory technician  . Financial resource strain: Not on file  . Food insecurity:    Worry: Not on file    Inability: Not on file  . Transportation needs:    Medical: Not on file    Non-medical: Not on file  Tobacco Use  . Smoking status: Former Smoker    Packs/day: 0.50    Years: 25.00    Pack years: 12.50    Types: Cigarettes    Last attempt to quit: 08/10/1978    Years since quitting: 39.6  . Smokeless tobacco: Never Used  Substance and Sexual Activity  . Alcohol use: No  . Drug use: No  . Sexual activity: Not on file  Lifestyle  . Physical activity:    Days per week: Not on file    Minutes per session: Not on file  . Stress: Not on file  Relationships  . Social connections:    Talks on phone: Not on file    Gets together: Not on file    Attends religious service: Not on file    Active member of club or organization: Not on file    Attends meetings of clubs or organizations: Not on file    Relationship status: Not on file  . Intimate partner violence:    Fear of current or ex  partner: Not on file     Emotionally abused: Not on file    Physically abused: Not on file    Forced sexual activity: Not on file  Other Topics Concern  . Not on file  Social History Narrative   Lives at home with her husband.   Right-handed.   No caffeine use.    Review of systems: Review of Systems  Constitutional: Negative for fever and chills.  HENT: Negative.   Eyes: Negative for blurred vision.  Respiratory: as per HPI  Cardiovascular: Negative for chest pain and palpitations.  Gastrointestinal: Negative for vomiting, diarrhea, blood per rectum. Genitourinary: Negative for dysuria, urgency, frequency and hematuria.  Musculoskeletal: Negative for myalgias, back pain and joint pain.  Skin: Negative for itching and rash.  Neurological: Negative for dizziness, tremors, focal weakness, seizures and loss of consciousness.  Endo/Heme/Allergies: Negative for environmental allergies.  Psychiatric/Behavioral: Negative for depression, suicidal ideas and hallucinations.  All other systems reviewed and are negative.  Marshell Garfinkel MD [facility] 04/07/2018, 11:17 AM

## 2018-04-13 ENCOUNTER — Telehealth: Payer: Self-pay | Admitting: Podiatry

## 2018-04-13 ENCOUNTER — Other Ambulatory Visit: Payer: PPO

## 2018-04-13 NOTE — Telephone Encounter (Signed)
Pt left voicemail stating she got a call from surgical center about her scheduled surgery for next week and she needs to change the date. She cannot come next Tuesday. Please call and ok to leave a message.

## 2018-04-13 NOTE — Telephone Encounter (Signed)
"  I got a call from the surgical center that I have a surgery scheduled for Tuesday of next week.  I need to reschedule that, I can't do it then."  I don't have you scheduled for surgery on 04/19/2018.  "Well they do at the surgery center.  I was supposed to have had surgery last week but I couldn't because of pulmonary issues."  They may have moved you there with hopes that you would be cleared by then.  "Dr. Vaughan Browner has not released me to have surgery."  Give [place] a call once he gives you the okay and we'll get you rescheduled.  "Okay, I will.  Thank you for your help."  I called the surgical center and left a message for Renee to cancel the surgery scheduled for 04/19/2018

## 2018-04-14 ENCOUNTER — Ambulatory Visit (INDEPENDENT_AMBULATORY_CARE_PROVIDER_SITE_OTHER)
Admission: RE | Admit: 2018-04-14 | Discharge: 2018-04-14 | Disposition: A | Discharge status: Home / Self Care | Payer: PPO | Source: Ambulatory Visit | Attending: Pulmonary Disease | Admitting: Pulmonary Disease

## 2018-04-14 DIAGNOSIS — J849 Interstitial pulmonary disease, unspecified: Secondary | ICD-10-CM | POA: Diagnosis not present

## 2018-04-14 DIAGNOSIS — J439 Emphysema, unspecified: Secondary | ICD-10-CM | POA: Diagnosis not present

## 2018-04-15 ENCOUNTER — Other Ambulatory Visit: Payer: PPO

## 2018-04-22 ENCOUNTER — Encounter: Payer: Self-pay | Admitting: Pulmonary Disease

## 2018-04-22 ENCOUNTER — Ambulatory Visit (INDEPENDENT_AMBULATORY_CARE_PROVIDER_SITE_OTHER): Payer: PPO | Admitting: Pulmonary Disease

## 2018-04-22 ENCOUNTER — Other Ambulatory Visit: Payer: Self-pay | Admitting: Pulmonary Disease

## 2018-04-22 VITALS — BP 142/90 | HR 88 | Ht 62.5 in | Wt 206.0 lb

## 2018-04-22 DIAGNOSIS — J849 Interstitial pulmonary disease, unspecified: Secondary | ICD-10-CM

## 2018-04-22 DIAGNOSIS — J439 Emphysema, unspecified: Secondary | ICD-10-CM

## 2018-04-22 LAB — PULMONARY FUNCTION TEST
DL/VA % PRED: 81 %
DL/VA: 3.75 ml/min/mmHg/L
DLCO unc % pred: 54 %
DLCO unc: 12.04 ml/min/mmHg
FEF 25-75 Post: 2.27 L/sec
FEF 25-75 Pre: 1.68 L/sec
FEF2575-%CHANGE-POST: 34 %
FEF2575-%PRED-PRE: 126 %
FEF2575-%Pred-Post: 170 %
FEV1-%Change-Post: 7 %
FEV1-%Pred-Post: 96 %
FEV1-%Pred-Pre: 89 %
FEV1-PRE: 1.61 L
FEV1-Post: 1.73 L
FEV1FVC-%CHANGE-POST: 7 %
FEV1FVC-%Pred-Pre: 109 %
FEV6-%CHANGE-POST: 0 %
FEV6-%Pred-Post: 86 %
FEV6-%Pred-Pre: 86 %
FEV6-PRE: 1.99 L
FEV6-Post: 2 L
FEV6FVC-%Pred-Post: 105 %
FEV6FVC-%Pred-Pre: 105 %
FVC-%Change-Post: 0 %
FVC-%PRED-POST: 82 %
FVC-%Pred-Pre: 81 %
FVC-PRE: 1.99 L
FVC-Post: 2 L
POST FEV1/FVC RATIO: 87 %
Post FEV6/FVC ratio: 100 %
Pre FEV1/FVC ratio: 81 %
Pre FEV6/FVC Ratio: 100 %
RV % pred: 65 %
RV: 1.51 L
TLC % pred: 74 %
TLC: 3.59 L

## 2018-04-22 NOTE — Patient Instructions (Signed)
I have reviewed your CT scan and pulmonary function test.  This showed stable lung disease Continue the trelegy.  We will start on supplemental oxygen Okay to undergo bunion surgery  Follow-up in 3 months.

## 2018-04-22 NOTE — Progress Notes (Addendum)
Cheryl Brown    [id number]    04/07/1936  Primary Care Physician:Gates, [name], MD  Referring Physician: [name], MD [address] [place], [place] [address]  Chief complaint: Follow up for emphysema, interstitial lung disease.  HPI: 82 year old with history of hypertension, diabetes, COPD, OSA (noncompliant with CPAP].   She was diagnosed with COPD in 2012.  Has been maintained on Advair.  Reports worsening dyspnea on exertion for the past few months- year.  Has dyspnea with rest, chronic cough with white mucus, wheezing, occasional fevers and chills at night.  She does not have seasonal allergies. + for acid reflux and indigestion.   Diagnosed with sleep apnea.  She has been intolerant of CPAP and is not interested in trying again. History noted for elevated ANA with positive ro antibody.  She was referred to Dr. Amil Amen in August 2018 but was told she did not have any rheumatologic issue.  She continues to have dry mouth, dry eyes.  Denies any joint pain, rash.  Office note from Dr. Amil Amen, rheumatology dated 05/20/17 Evaluated for positive ANA, SSA. Thought to be false-positive in spite of having xerostomia.  No systemic immunosuppression reccomended as there are no extraglandular manifestation.  04/05/2018- Scheduled for a bunion repair on rt foot on 8/27 but was canceled as anesthetist noted dyspnea, hypoxia  Pets: No pets Occupation: Retired from [employer] in 1979 Exposures: No known exposures, no mold, hot tub Smoking history: 30-pack-year smoking history.  Quit in 1988 Travel History: Lived in [place] all her life.  No significant travel history.  Interim history: Given prednisone taper at last office visit.  She cannot tell if this helps with breathing Switch to Trelegy at last office visit.  She continues on this with stable symptoms She had a follow-up CT and PFTs which are stable.  Physical Exam: Blood pressure (!) 142/90, pulse 88,  height 5' 2.5" (1.588 m), weight 206 lb (93.4 kg), SpO2 92 %. Gen:      No acute distress HEENT:  EOMI, sclera anicteric Neck:     No masses; no thyromegaly Lungs:    Clear to auscultation bilaterally; normal respiratory effort CV:         Regular rate and rhythm; no murmurs Abd:      + bowel sounds; soft, non-tender; no palpable masses, no distension Ext:    No edema; adequate peripheral perfusion Skin:      Warm and dry; no rash Neuro: alert and oriented x 3 Psych: normal mood and affect  Data Reviewed: Imaging CT abdomen pelvis 08/28/16-bibasilar opacities, atelectasis Chest x-ray 08/28/16-coarse interstitial marking, nodular opacity in the left midlung region. Chest x-ray 09/10/17- stable coarse interstitial markings. High-resolution CT chest 09/15/17- bronchial wall thickening with mild emphysema.  Diffuse groundglass attenuation, septal thickening, bronchiectasis with moderate air trapping.  High-resolution CT 04/14/18-mild emphysema.  Stable findings of groundglass opacities, septal thickening, bronchiectasis with air-trapping.  Indeterminate for UIP.  Left main and three-vessel coronary artery disease. I have reviewed the images personally.  PFTs  09/21/17 FVC 1.96 [79%], FEV1 1.64 [90%], F/F 84, TLC 73%, DLCO 55% Mild restriction with severe diffusion defect that corrects for alveolar volume.  04/22/2018 FVC 2.0 [82%], FEV1 1.73 [96%], F/F 87, TLC 74%, DLCO 54% Mild restriction with severe diffusion defect that corrects for alveolar volume.  FENO 09/10/17- 9  6-minute walk test 11/02/17-  Starting heart rate, sats-69, 93% Starting heart rate, sats-108, 94% Distance walked 408,  Patient stopped with 30 seconds left due to SOB and leg cramps.  Labs: 04/05/17-positive ANA, Ro antibody 1.4  Repeat labs 09/21/17 Blood allergy profile-negative, IgE 4 ILD panel-ANA negative, angiotensin-converting enzyme-86 Rheumatoid factor, CCP-negative Ro antibody 1.1 La negative Hypersensitivity  panel-negative  Cardiac Echocardiogram 09/24/17- Normal LV size with mild LV hypertrophy. EF 55-60%. Normal RV size and systolic function. No significant valvular abnormalities. PA peak pressure 31  Cardiac stress test 09/24/2016- low risk study, EF 55-65%.  Assessment:  Emphysema PFTs shows mild emphysematous changes.  There is no evidence of obstruction on PFTs.  Continue is on trelegy inhaler Start oxygen with exertion and at night as she still has desats on exertion.  Interstitial lung disease CT scan reviewed which shows nonspecific interstitial lung disease with air trapping.  Looks like chronic HP but does not have any exposure suggestive of hypersensitivity pneumonitis.  She has been evaluated by Dr. Amil Amen in the past for positive ANA, Ro antibody and was told that there is no evidence of rheumatologic issues. Repeat ILD panel noted for mild elevation in ACE level and persistent positive Ro antibody.    Repeat high-resolution CT shows stable findings with no progression.  She also has stable minimal restriction and moderate diffusion defect. Discussed further work-up and possibly a bronchoscopy with biopsy but she would like to hold off for now We will continue to monitor.  GERD She has ongoing GERD and may have reflux contributing to ILD.   Continue prilosec  Clearance for bunion foot surgery Cleared from pulmonary viewpoint to undergo surgery.  Plan/Recommendations: - Continue trelegy - Start supplemental oxygen - Cleared for bunion surgery of the foot  Outpatient Encounter Medications as of 04/22/2018  Medication Sig  . amitriptyline (ELAVIL) 50 MG tablet Take 50 mg by mouth at bedtime.  . BD PEN NEEDLE NANO U/F 32G X 4 MM MISC use as directed once daily  . furosemide (LASIX) 20 MG tablet Take 1 tablet (20 mg total) by mouth daily. (Patient taking differently: Take 20 mg by mouth daily as needed for fluid. )  . glucose blood (ONE TOUCH ULTRA TEST) test strip 1 each by  Other route daily. Use to check blood sugar daily Dx: E11.41  . Insulin Glargine (TOUJEO SOLOSTAR Kemp) Inject 22 Units into the skin every morning.  [name] losartan (COZAAR) 50 MG tablet Take 50 mg by mouth daily.  [name] DELICA LANCETS MISC Check blood sugar as directed  . polyethylene glycol powder (MIRALAX) powder Take 34 g by mouth daily. 2 capfuls daily per Urologist   . pramipexole (MIRAPEX) 0.25 MG tablet Take 1 tablet (0.25 mg total) by mouth at bedtime.  [name] PREMARIN vaginal cream Place 1 application vaginally every other day.  . [DISCONTINUED] predniSONE (DELTASONE) 10 MG tablet 4 tabs x 3 days, 3 tabs x 3 days, 2 tabs x 3 days, 1 tab x 3 days then stop   No facility-administered encounter medications on file as of 04/22/2018.     Allergies as of 04/22/2018 - Review Complete 04/22/2018  Allergen Reaction Noted  . Morphine and related Shortness Of Breath 12/23/2011  . Codeine    . Ipratropium-albuterol    . Neosporin [neomycin-bacitracin zn-polymyx]  03/04/2018  . Niaspan [niacin]  02/16/2011  . Nitrofurantoin macrocrystal Nausea Only 03/04/2018  . Norvasc [amlodipine besylate]  10/15/2014  . Pramipexole dihydrochloride    . Pregabalin    . Propoxyphene n-acetaminophen    . Simvastatin    . Sulfa antibiotics  03/04/2018  .  Sulfonamide derivatives Itching     Past Medical History:  Diagnosis Date  . Blood transfusion   . COPD (chronic obstructive pulmonary disease) ([place])   . Diabetes mellitus   . Dysmetabolic syndrome   . Hyperlipemia   . Hypertension   . IBS (irritable bowel syndrome)   . Neoplasm of pituitary gland    Dr.Nudelman   . Palpitations   . Pituitary adenoma ([place]) 1991   Dr.Love and Dr.Nudelman   . Recurrent UTI 2013-14   Dr. Thomasene Mohair and Dr. Marvel Plan  . Reflux esophagitis   . Restless leg syndrome   . Scoliosis    Spinal Stenosis  . Shingles outbreak     Past Surgical History:  Procedure Laterality Date  . ANKLE FRACTURE SURGERY     left ankle   . BREAST LUMPECTOMY     4 lumps removed from breast, all benign   . CHOLECYSTECTOMY    . COLONOSCOPY  2014   [facility], [facility]  . CYSTOSCOPY  2014    Dr Thomasene Mohair  . ESOPHAGEAL DILATION  01/2003  . FINGER AMPUTATION     related to work injury   . g2 p2    . KNEE SURGERY  23/3007    complicated by difficult resuscitation post anesthesia   . LAPAROSCOPIC CHOLECYSTECTOMY  03/2011   [facility] [place]     for dysfunctional bleeding   . pituitary adenoma     Dr. Erling Cruz  . UPPER GI ENDOSCOPY  2014   gastric polyp  . URETHRAL DILATION  2014    Family History  Problem Relation Age of Onset  . Alzheimer's disease Mother   . Arthritis Mother   . Mental illness Mother        alzheimers  . Bone cancer Father   . Prostate cancer Father   . Diabetes Father   . Coronary artery disease Father   . Cancer Father        bone, prostate  . Alzheimer's disease Maternal Aunt   . Mental illness Maternal Aunt        alzheimers  . Mitral valve prolapse Sister   . Alzheimer's disease Sister   . Hypertension Sister   . Diabetes Sister   . Stroke Paternal Grandmother   . Osteoporosis Sister   . Mental illness Maternal Grandmother        alzheimers    Social History   Socioeconomic History  . Marital status: Widowed    Spouse name: Not on file  . Number of children: 2  . Years of education: 9th  . Highest education level: Not on file  Occupational History  . Occupation: Retired  Scientific laboratory technician  . Financial resource strain: Not on file  . Food insecurity:    Worry: Not on file    Inability: Not on file  . Transportation needs:    Medical: Not on file    Non-medical: Not on file  Tobacco Use  . Smoking status: Former Smoker    Packs/day: 0.50    Years: 25.00    Pack years: 12.50    Types: Cigarettes    Last attempt to quit: 08/10/1978    Years since quitting: 39.7  . Smokeless tobacco: Never Used  Substance and Sexual Activity  . Alcohol use: No  .  Drug use: No  . Sexual activity: Not on file  Lifestyle  . Physical activity:    Days per week: Not on file    Minutes per  session: Not on file  . Stress: Not on file  Relationships  . Social connections:    Talks on phone: Not on file    Gets together: Not on file    Attends religious service: Not on file    Active member of club or organization: Not on file    Attends meetings of clubs or organizations: Not on file    Relationship status: Not on file  . Intimate partner violence:    Fear of current or ex partner: Not on file    Emotionally abused: Not on file    Physically abused: Not on file    Forced sexual activity: Not on file  Other Topics Concern  . Not on file  Social History Narrative   Lives at home with her husband.   Right-handed.   No caffeine use.   Review of systems: Review of Systems  Constitutional: Negative for fever and chills.  HENT: Negative.   Eyes: Negative for blurred vision.  Respiratory: as per HPI  Cardiovascular: Negative for chest pain and palpitations.  Gastrointestinal: Negative for vomiting, diarrhea, blood per rectum. Genitourinary: Negative for dysuria, urgency, frequency and hematuria.  Musculoskeletal: Negative for myalgias, back pain and joint pain.  Skin: Negative for itching and rash.  Neurological: Negative for dizziness, tremors, focal weakness, seizures and loss of consciousness.  Endo/Heme/Allergies: Negative for environmental allergies.  Psychiatric/Behavioral: Negative for depression, suicidal ideas and hallucinations.  All other systems reviewed and are negative.  Marshell Garfinkel MD [facility] 04/22/2018, 4:22 PM

## 2018-04-22 NOTE — Progress Notes (Signed)
PFT done today. 

## 2018-04-27 DIAGNOSIS — J439 Emphysema, unspecified: Secondary | ICD-10-CM | POA: Diagnosis not present

## 2018-04-28 ENCOUNTER — Telehealth: Payer: Self-pay | Admitting: *Deleted

## 2018-04-28 DIAGNOSIS — Z7984 Long term (current) use of oral hypoglycemic drugs: Secondary | ICD-10-CM | POA: Diagnosis not present

## 2018-04-28 DIAGNOSIS — R739 Hyperglycemia, unspecified: Secondary | ICD-10-CM | POA: Diagnosis not present

## 2018-04-28 DIAGNOSIS — E119 Type 2 diabetes mellitus without complications: Secondary | ICD-10-CM | POA: Diagnosis not present

## 2018-04-28 DIAGNOSIS — J449 Chronic obstructive pulmonary disease, unspecified: Secondary | ICD-10-CM | POA: Diagnosis not present

## 2018-04-28 DIAGNOSIS — Z794 Long term (current) use of insulin: Secondary | ICD-10-CM | POA: Diagnosis not present

## 2018-04-28 NOTE — Telephone Encounter (Signed)
"  I was scheduled to have surgery three weeks ago.  The anesthesiologist stopped it because I didn't have enough oxygen.  So I done been to them and put on oxygen.  I was calling to set up an appointment so I can have this foot surgery.  So if you could, give me a call back."

## 2018-05-02 ENCOUNTER — Inpatient Hospital Stay: Admission: RE | Admit: 2018-05-02 | Payer: PPO | Source: Ambulatory Visit

## 2018-05-02 ENCOUNTER — Telehealth: Payer: Self-pay | Admitting: Pulmonary Disease

## 2018-05-02 DIAGNOSIS — R600 Localized edema: Secondary | ICD-10-CM | POA: Diagnosis not present

## 2018-05-02 DIAGNOSIS — E119 Type 2 diabetes mellitus without complications: Secondary | ICD-10-CM | POA: Diagnosis not present

## 2018-05-02 NOTE — Telephone Encounter (Signed)
Melissa From [facility] is calling back [phone]

## 2018-05-02 NOTE — Telephone Encounter (Signed)
The patient states that she received an oxygen concetrator and two tanks from [facility]. She states these are to big and bulky for her to use and they run out to fast. An would like a POC. Based off of our refferal to [facility] we asked for a best fit O2 system for the patient and for it to be titrated to her needs. According to the patient she states [facility] told her there was nothing further they could do for her. I have called Melissa with [facility] to follow up on this situatuition I have left message. I will leave this in triage for follow up.

## 2018-05-02 NOTE — Telephone Encounter (Signed)
Called and spoke with Melissa she stated that the intake person put the order in wrong. Melissa fixed the problem and the patient is being titrated as best fit. Nothing further needed from [place].

## 2018-05-03 ENCOUNTER — Telehealth: Payer: Self-pay | Admitting: Pulmonary Disease

## 2018-05-03 ENCOUNTER — Ambulatory Visit: Payer: PPO | Admitting: Pulmonary Disease

## 2018-05-03 ENCOUNTER — Ambulatory Visit: Payer: PPO

## 2018-05-03 NOTE — Telephone Encounter (Signed)
Please see 05/02/18 phone note.  I have spoken to [facility] with [facility], who verified that order is corrected. Melissa stated that once pt returns [facility] phone call, she will be scheduled to be titrated to best fit.  I have made pt aware of this information.  Nothing further is needed.

## 2018-05-03 NOTE — Telephone Encounter (Signed)
Called and spoke to [facility] with [facility] to verify below message.  Melissa stated that pt made [facility] aware that she can not use pulse only continuous. [facility] made pt aware of the independence POC that rolls and pt declined, as she preferred to have a POC that she can carry.   Melissa stated that pt can be proved with additional tanks that she can carry in shoulder pack.  I have spoken to pt and relayed above message.   I have also made pt aware that I personally am not aware of another DME that can provide a light wight POC that goes up to 3L cont.  I recommended that she call around to DME companies.  Pt states she will call [facility] back and order independence POC. Nothing further is needed at this time.

## 2018-05-04 IMAGING — NM NM MISC PROCEDURE
6 series · 36 of 36 positions shown · non-contrast
Comparison: none

[Series 1: rest · 6.51mm/px · 6 of 64 frames shown]
[frame 6/64]
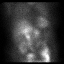
[frame 16/64]
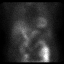
[frame 27/64]
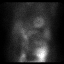
[frame 38/64]
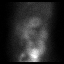
[frame 48/64]
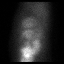
[frame 59/64]
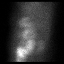

[Series 1: wbr_r-proj_st rest · 6.51mm/px · 6 of 64 frames shown]
[frame 6/64]
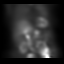
[frame 16/64]
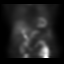
[frame 27/64]
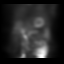
[frame 38/64]
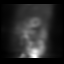
[frame 48/64]
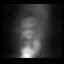
[frame 59/64]
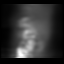

[Series 2: wbr_s-proj_st stress · 6.51mm/px · 6 of 512 frames shown (1 of 2)]
[frame 43/512]
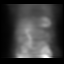
[frame 128/512]
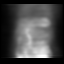
[frame 214/512]
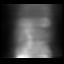
[frame 299/512]
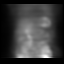
[frame 384/512]
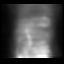
[frame 470/512]
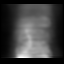

[Series 2: stress · 6.51mm/px · 6 of 64 frames shown (1 of 2)]
[frame 6/64]
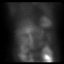
[frame 16/64]
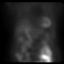
[frame 27/64]
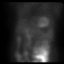
[frame 38/64]
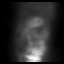
[frame 48/64]
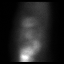
[frame 59/64]
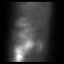

[Series 2: stress · 6.51mm/px · 6 of 490 frames shown (2 of 2)]
[frame 41/490  full-range]
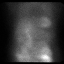
[frame 123/490  full-range]
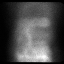
[frame 205/490  full-range]
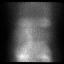
[frame 286/490  full-range]
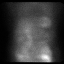
[frame 368/490  full-range]
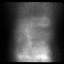
[frame 450/490  full-range]
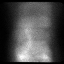

[Series 2: wbr_s-proj_st stress · 6.51mm/px · 6 of 64 frames shown (2 of 2)]
[frame 6/64]
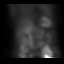
[frame 16/64]
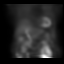
[frame 27/64]
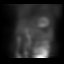
[frame 38/64]
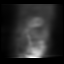
[frame 48/64]
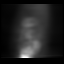
[frame 59/64]
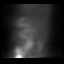

[36 of 36 positions shown; findings below may reference images not displayed]

Canned report from images found in remote index.

Refer to host system for actual result text.

## 2018-05-09 ENCOUNTER — Telehealth: Payer: Self-pay | Admitting: Pulmonary Disease

## 2018-05-09 DIAGNOSIS — J439 Emphysema, unspecified: Secondary | ICD-10-CM | POA: Diagnosis not present

## 2018-05-09 DIAGNOSIS — Z794 Long term (current) use of insulin: Secondary | ICD-10-CM | POA: Diagnosis not present

## 2018-05-09 DIAGNOSIS — E1140 Type 2 diabetes mellitus with diabetic neuropathy, unspecified: Secondary | ICD-10-CM | POA: Diagnosis not present

## 2018-05-09 DIAGNOSIS — I10 Essential (primary) hypertension: Secondary | ICD-10-CM | POA: Diagnosis not present

## 2018-05-09 DIAGNOSIS — M19039 Primary osteoarthritis, unspecified wrist: Secondary | ICD-10-CM | POA: Diagnosis not present

## 2018-05-09 DIAGNOSIS — M189 Osteoarthritis of first carpometacarpal joint, unspecified: Secondary | ICD-10-CM | POA: Diagnosis not present

## 2018-05-09 DIAGNOSIS — J449 Chronic obstructive pulmonary disease, unspecified: Secondary | ICD-10-CM | POA: Diagnosis not present

## 2018-05-09 NOTE — Telephone Encounter (Signed)
Patient called and would like to come in and be titrated for pulsed oxygen to see if she can qualify for simply go mini. I have made her a 66min appt.Nothing further needed at this time.

## 2018-05-10 ENCOUNTER — Ambulatory Visit (INDEPENDENT_AMBULATORY_CARE_PROVIDER_SITE_OTHER): Payer: PPO | Admitting: *Deleted

## 2018-05-10 DIAGNOSIS — J439 Emphysema, unspecified: Secondary | ICD-10-CM | POA: Diagnosis not present

## 2018-05-10 NOTE — Progress Notes (Signed)
Qualifying walk for POC was completed today.

## 2018-05-12 DIAGNOSIS — E119 Type 2 diabetes mellitus without complications: Secondary | ICD-10-CM | POA: Diagnosis not present

## 2018-05-12 DIAGNOSIS — Z23 Encounter for immunization: Secondary | ICD-10-CM | POA: Diagnosis not present

## 2018-05-17 NOTE — Telephone Encounter (Signed)
I'm returning your call.  Sorry for the late response.  I had to make sure Dr. Paulla Dolly received and reviewed the clearance from your doctor.  Do you have a date in mind that you would like to schedule?  "It's not an emergency.  I need to do it after my vacation.  So, can we do it after the week of November 9-15?"  He can do it on November 19.  "We'll just be getting back then.  Can we do it the following week?"  Yes, Dr. Paulla Dolly can do it on November 26.  "Okay, that sounds good.  Do I need to come back in and see Dr. Paulla Dolly?"  You do not need to come in to see Dr. Paulla Dolly again prior to your surgery date.

## 2018-05-27 ENCOUNTER — Ambulatory Visit (INDEPENDENT_AMBULATORY_CARE_PROVIDER_SITE_OTHER): Payer: PPO | Admitting: Cardiovascular Disease

## 2018-05-27 ENCOUNTER — Encounter: Payer: Self-pay | Admitting: Cardiovascular Disease

## 2018-05-27 VITALS — BP 124/78 | HR 66 | Ht 62.5 in | Wt 201.0 lb

## 2018-05-27 DIAGNOSIS — J439 Emphysema, unspecified: Secondary | ICD-10-CM | POA: Diagnosis not present

## 2018-05-27 DIAGNOSIS — R0602 Shortness of breath: Secondary | ICD-10-CM | POA: Diagnosis not present

## 2018-05-27 DIAGNOSIS — I10 Essential (primary) hypertension: Secondary | ICD-10-CM

## 2018-05-27 NOTE — Progress Notes (Signed)
Cardiology Office Note:    Date:  05/27/2018   ID:  Cheryl Brown, DOB [birth year], MRN [id number]  PCP:  Cheryl Huddle, MD  Cardiologist:  Cheryl Mocha, MD  Electrophysiologist:  None   Referring MD: Cheryl Huddle, MD   Chief Complaint  Patient presents with  . Shortness of Breath    History of Present Illness:    Cheryl Brown is a 82 y.o. female with a hx of shortness of breath, hypertension, hyperlipidemia, and type 2 diabetes.  Since I last saw the patient in January 2018 she has been diagnosed with COPD and interstitial lung disease.  She is been followed closely by Dr. Vaughan Brown.  She is now on home oxygen.  Past cardiac studies have included an echocardiogram which showed normal LV systolic function and grade 1 diastolic dysfunction without significant valvular pathology.  She had a stress nuclear scan which was low risk and demonstrated no significant ischemia.  She presents today for follow-up evaluation.  The patient is here alone today.  She complains of shortness of breath with activity.  She is on 3 L of oxygen by nasal cannula.  She has some leg swelling but this is improved with use of furosemide 20 mg daily.  She has orthopnea.  No PND, chest pain, or chest pressure.  No heart palpitations, lightheadedness, or syncope.  Past Medical History:  Diagnosis Date  . Blood transfusion   . COPD (chronic obstructive pulmonary disease) ([place])   . Diabetes mellitus   . Dysmetabolic syndrome   . Hyperlipemia   . Hypertension   . IBS (irritable bowel syndrome)   . Neoplasm of pituitary gland    Dr.Nudelman   . Palpitations   . Pituitary adenoma ([place]) 1991   Dr.Love and Dr.Nudelman   . Recurrent UTI 2013-14   Dr. Thomasene Brown and Dr. Marvel Plan  . Reflux esophagitis   . Restless leg syndrome   . Scoliosis    Spinal Stenosis  . Shingles outbreak     Past Surgical History:  Procedure Laterality Date  . ANKLE FRACTURE SURGERY     left ankle  . BREAST LUMPECTOMY     4  lumps removed from breast, all benign   . CHOLECYSTECTOMY    . COLONOSCOPY  2014   [facility], [facility]  . CYSTOSCOPY  2014    Dr Cheryl Brown  . ESOPHAGEAL DILATION  01/2003  . FINGER AMPUTATION     related to work injury   . g2 p2    . KNEE SURGERY  17/6160    complicated by difficult resuscitation post anesthesia   . LAPAROSCOPIC CHOLECYSTECTOMY  03/2011   [facility] [place]     for dysfunctional bleeding   . pituitary adenoma     Dr. Erling Cheryl Brown  . UPPER GI ENDOSCOPY  2014   gastric polyp  . URETHRAL DILATION  2014    Current Medications: Current Meds  Medication Sig  . amitriptyline (ELAVIL) 50 MG tablet Take 50 mg by mouth at bedtime.  . BD PEN NEEDLE NANO U/F 32G X 4 MM MISC use as directed once daily  . furosemide (LASIX) 20 MG tablet Take 1 tablet (20 mg total) by mouth daily. (Patient taking differently: Take 20 mg by mouth daily as needed for fluid. )  . glucose blood (ONE TOUCH ULTRA TEST) test strip 1 each by Other route daily. Use to check blood sugar daily Dx: E11.41  . Insulin Glargine (TOUJEO SOLOSTAR [place]) Inject 22 Units  into the skin every morning.  Cheryl Brown losartan (COZAAR) 50 MG tablet Take 50 mg by mouth daily.  Cheryl Brown DELICA LANCETS MISC Check blood sugar as directed  . polyethylene glycol powder (MIRALAX) powder Take 34 g by mouth daily. 2 capfuls daily per Urologist   . pramipexole (MIRAPEX) 0.25 MG tablet Take 1 tablet (0.25 mg total) by mouth at bedtime.  Cheryl Brown PREMARIN vaginal cream Place 1 application vaginally every other day.     Allergies:   Morphine and related; Codeine; Ipratropium-albuterol; Neosporin [neomycin-bacitracin zn-polymyx]; Niaspan [niacin]; Nitrofurantoin macrocrystal; Norvasc [amlodipine besylate]; Pramipexole dihydrochloride; Pregabalin; Propoxyphene n-acetaminophen; Simvastatin; Sulfa antibiotics; and Sulfonamide derivatives   Social History   Socioeconomic History  . Marital status: Widowed    Spouse name: Not on file    . Number of children: 2  . Years of education: 9th  . Highest education level: Not on file  Occupational History  . Occupation: Retired  Scientific laboratory technician  . Financial resource strain: Not on file  . Food insecurity:    Worry: Not on file    Inability: Not on file  . Transportation needs:    Medical: Not on file    Non-medical: Not on file  Tobacco Use  . Smoking status: Former Smoker    Packs/day: 0.50    Years: 25.00    Pack years: 12.50    Types: Cigarettes    Last attempt to quit: 08/10/1978    Years since quitting: 39.8  . Smokeless tobacco: Never Used  Substance and Sexual Activity  . Alcohol use: No  . Drug use: No  . Sexual activity: Not on file  Lifestyle  . Physical activity:    Days per week: Not on file    Minutes per session: Not on file  . Stress: Not on file  Relationships  . Social connections:    Talks on phone: Not on file    Gets together: Not on file    Attends religious service: Not on file    Active member of club or organization: Not on file    Attends meetings of clubs or organizations: Not on file    Relationship status: Not on file  Other Topics Concern  . Not on file  Social History Narrative   Lives at home with her husband.   Right-handed.   No caffeine use.     Family History: The patient's family history includes Alzheimer's disease in her maternal aunt, mother, and sister; Arthritis in her mother; Bone cancer in her father; Cancer in her father; Coronary artery disease in her father; Diabetes in her father and sister; Hypertension in her sister; Mental illness in her maternal aunt, maternal grandmother, and mother; Mitral valve prolapse in her sister; Osteoporosis in her sister; Prostate cancer in her father; Stroke in her paternal grandmother.  ROS:   Please see the history of present illness.    All other systems reviewed and are negative.  EKGs/Labs/Other Studies Reviewed:    The following studies were reviewed today: Echo  09-24-2017: Study Conclusions  - Left ventricle: The cavity size was normal. Wall thickness was   increased in a pattern of mild LVH. Systolic function was normal.   The estimated ejection fraction was in the range of 55% to 60%.   Wall motion was normal; there were no regional wall motion   abnormalities. Doppler parameters are consistent with abnormal   left ventricular relaxation (grade 1 diastolic dysfunction). - Aortic valve: There was no stenosis. - Mitral valve:  Mildly calcified annulus. There was no significant   regurgitation. - Left atrium: The atrium was mildly dilated. - Right ventricle: The cavity size was normal. Systolic function   was normal. - Tricuspid valve: Peak RV-RA gradient (S): 28 mm Hg. - Pulmonary arteries: PA peak pressure: 31 mm Hg (S). - Inferior vena cava: The vessel was normal in size. The   respirophasic diameter changes were in the normal range (= 50%),   consistent with normal central venous pressure.  Impressions:  - Normal LV size with mild LV hypertrophy. EF 55-60%. Normal RV   size and systolic function. No significant valvular   abnormalities.  Myocardial perfusion study 09/24/2016: Study Highlights     The left ventricular ejection fraction is normal (55-65%).  Nuclear stress EF: 62%.  There was no ST segment deviation noted during stress.  No T wave inversion was noted during stress.  The study is normal.  This is a low risk study.   Low risk stress nuclear study with normal perfusion and normal left ventricular regional and global systolic function.    EKG:  EKG is ordered today.  The ekg ordered today demonstrates normal sinus rhythm 66 bpm, incomplete right bundle branch block.  Recent Labs: 09/21/2017: Hemoglobin 14.6; Platelets 253.0  Recent Lipid Panel    Component Value Date/Time   CHOL 196 05/28/2015 1416   TRIG 293.0 (H) 05/28/2015 1416   HDL 47.50 05/28/2015 1416   CHOLHDL 4 05/28/2015 1416   VLDL 58.6 (H)  05/28/2015 1416   LDLCALC 132 (H) 11/16/2013 1418   LDLDIRECT 122.0 05/28/2015 1416    Physical Exam:    VS:  BP 124/78   Pulse 66   Ht 5' 2.5" (1.588 m)   Wt 201 lb (91.2 kg)   BMI 36.18 kg/m     Wt Readings from Last 3 Encounters:  05/27/18 201 lb (91.2 kg)  04/22/18 206 lb (93.4 kg)  04/07/18 203 lb (92.1 kg)     GEN:  Well nourished, well developed in no acute distress HEENT: Normal NECK: No JVD; No carotid bruits LYMPHATICS: No lymphadenopathy CARDIAC: RRR, no murmurs, rubs, gallops RESPIRATORY:  Coarse breath sounds bilaterally ABDOMEN: Soft, non-tender, non-distended MUSCULOSKELETAL:  No edema; No deformity  SKIN: Warm and dry NEUROLOGIC:  Alert and oriented x 3 PSYCHIATRIC:  Normal affect   ASSESSMENT:    1. SOB (shortness of breath)   2. Essential hypertension    PLAN:    In order of problems listed above:  1. Symptoms primarily appear to be pulmonary related with COPD and interstitial lung disease.  She is followed closely by Dr. Vaughan Brown.  She will continue on home oxygen.  We discussed diuretic usage.  She can increase furosemide to 40 mg if she has more edema. Otherwise she will continue on her current medical program. Most recent echo study reviewed.  2. BP controlled   Medication Adjustments/Labs and Tests Ordered: Current medicines are reviewed at length with the patient today.  Concerns regarding medicines are outlined above.  Orders Placed This Encounter  Procedures  . EKG 12-Lead   No orders of the defined types were placed in this encounter.   Patient Instructions  Medication Instructions:  Your provider recommends that you continue on your current medications as directed. Please refer to the Current Medication list given to you today.    Labwork: None  Testing/Procedures: None  Follow-Up: Your provider wants you to follow-up in: 1 year with Dr. Burt Knack. You will receive a reminder  letter in the mail two months in advance. If you don't  receive a letter, please call our office to schedule the follow-up appointment.    Any Other Special Instructions Will Be Listed Below (If Applicable).     If you need a refill on your cardiac medications before your next appointment, please call your pharmacy.      Signed, Cheryl Mocha, MD  05/27/2018 5:17 PM    [place]

## 2018-05-27 NOTE — Patient Instructions (Signed)

## 2018-06-02 DIAGNOSIS — E119 Type 2 diabetes mellitus without complications: Secondary | ICD-10-CM | POA: Diagnosis not present

## 2018-06-02 DIAGNOSIS — Z23 Encounter for immunization: Secondary | ICD-10-CM | POA: Diagnosis not present

## 2018-06-14 DIAGNOSIS — H35371 Puckering of macula, right eye: Secondary | ICD-10-CM | POA: Diagnosis not present

## 2018-06-14 DIAGNOSIS — H43822 Vitreomacular adhesion, left eye: Secondary | ICD-10-CM | POA: Diagnosis not present

## 2018-06-14 DIAGNOSIS — H31012 Macula scars of posterior pole (postinflammatory) (post-traumatic), left eye: Secondary | ICD-10-CM | POA: Diagnosis not present

## 2018-06-14 DIAGNOSIS — H35342 Macular cyst, hole, or pseudohole, left eye: Secondary | ICD-10-CM | POA: Diagnosis not present

## 2018-06-27 DIAGNOSIS — J439 Emphysema, unspecified: Secondary | ICD-10-CM | POA: Diagnosis not present

## 2018-07-04 DIAGNOSIS — J449 Chronic obstructive pulmonary disease, unspecified: Secondary | ICD-10-CM | POA: Diagnosis not present

## 2018-07-04 DIAGNOSIS — M189 Osteoarthritis of first carpometacarpal joint, unspecified: Secondary | ICD-10-CM | POA: Diagnosis not present

## 2018-07-04 DIAGNOSIS — E119 Type 2 diabetes mellitus without complications: Secondary | ICD-10-CM | POA: Diagnosis not present

## 2018-07-04 DIAGNOSIS — E1140 Type 2 diabetes mellitus with diabetic neuropathy, unspecified: Secondary | ICD-10-CM | POA: Diagnosis not present

## 2018-07-04 DIAGNOSIS — J439 Emphysema, unspecified: Secondary | ICD-10-CM | POA: Diagnosis not present

## 2018-07-04 DIAGNOSIS — M19039 Primary osteoarthritis, unspecified wrist: Secondary | ICD-10-CM | POA: Diagnosis not present

## 2018-07-04 DIAGNOSIS — I10 Essential (primary) hypertension: Secondary | ICD-10-CM | POA: Diagnosis not present

## 2018-07-04 DIAGNOSIS — J441 Chronic obstructive pulmonary disease with (acute) exacerbation: Secondary | ICD-10-CM | POA: Diagnosis not present

## 2018-07-05 ENCOUNTER — Encounter: Payer: Self-pay | Admitting: Podiatry

## 2018-07-05 DIAGNOSIS — I10 Essential (primary) hypertension: Secondary | ICD-10-CM | POA: Diagnosis not present

## 2018-07-05 DIAGNOSIS — M2011 Hallux valgus (acquired), right foot: Secondary | ICD-10-CM | POA: Diagnosis not present

## 2018-07-13 ENCOUNTER — Ambulatory Visit (INDEPENDENT_AMBULATORY_CARE_PROVIDER_SITE_OTHER): Payer: PPO | Admitting: Podiatry

## 2018-07-13 ENCOUNTER — Encounter: Payer: Self-pay | Admitting: Podiatry

## 2018-07-13 ENCOUNTER — Ambulatory Visit (INDEPENDENT_AMBULATORY_CARE_PROVIDER_SITE_OTHER): Payer: PPO

## 2018-07-13 DIAGNOSIS — M2011 Hallux valgus (acquired), right foot: Secondary | ICD-10-CM

## 2018-07-13 DIAGNOSIS — M2012 Hallux valgus (acquired), left foot: Secondary | ICD-10-CM | POA: Diagnosis not present

## 2018-07-13 NOTE — Progress Notes (Signed)
Subjective:   Patient ID: Cheryl Brown, female   DOB: 82 y.o.   MRN: [id number]   HPI Patient states doing well with surgery with minimal discomfort   ROS      Objective:  Physical Exam  Neurovascular status intact negative Homans sign noted wound edges well coapted first metatarsal right with good alignment and no indications of prominent currently     Assessment:  Doing well post bunionectomy right first metatarsal     Plan:  Advised on continued compression elevation and reapplied sterile dressing and immobilization.  Reappoint to recheck 3 weeks or earlier if needed  X-ray indicated satisfactory resection of bone with good alignment first metatarsal joint congruence

## 2018-07-14 ENCOUNTER — Other Ambulatory Visit: Payer: Self-pay | Admitting: Internal Medicine

## 2018-07-14 DIAGNOSIS — G2581 Restless legs syndrome: Secondary | ICD-10-CM | POA: Diagnosis not present

## 2018-07-14 DIAGNOSIS — E669 Obesity, unspecified: Secondary | ICD-10-CM | POA: Diagnosis not present

## 2018-07-14 DIAGNOSIS — B354 Tinea corporis: Secondary | ICD-10-CM | POA: Diagnosis not present

## 2018-07-14 DIAGNOSIS — I10 Essential (primary) hypertension: Secondary | ICD-10-CM | POA: Diagnosis not present

## 2018-07-14 DIAGNOSIS — Z1389 Encounter for screening for other disorder: Secondary | ICD-10-CM | POA: Diagnosis not present

## 2018-07-14 DIAGNOSIS — Z1231 Encounter for screening mammogram for malignant neoplasm of breast: Secondary | ICD-10-CM

## 2018-07-14 DIAGNOSIS — E1140 Type 2 diabetes mellitus with diabetic neuropathy, unspecified: Secondary | ICD-10-CM | POA: Diagnosis not present

## 2018-07-14 DIAGNOSIS — J449 Chronic obstructive pulmonary disease, unspecified: Secondary | ICD-10-CM | POA: Diagnosis not present

## 2018-07-14 DIAGNOSIS — K219 Gastro-esophageal reflux disease without esophagitis: Secondary | ICD-10-CM | POA: Diagnosis not present

## 2018-07-14 DIAGNOSIS — E119 Type 2 diabetes mellitus without complications: Secondary | ICD-10-CM | POA: Diagnosis not present

## 2018-07-14 DIAGNOSIS — E559 Vitamin D deficiency, unspecified: Secondary | ICD-10-CM | POA: Diagnosis not present

## 2018-07-14 DIAGNOSIS — R0902 Hypoxemia: Secondary | ICD-10-CM | POA: Diagnosis not present

## 2018-07-14 DIAGNOSIS — H6981 Other specified disorders of Eustachian tube, right ear: Secondary | ICD-10-CM | POA: Diagnosis not present

## 2018-07-14 DIAGNOSIS — Z0000 Encounter for general adult medical examination without abnormal findings: Secondary | ICD-10-CM | POA: Diagnosis not present

## 2018-07-22 ENCOUNTER — Telehealth: Payer: Self-pay | Admitting: Pulmonary Disease

## 2018-07-22 NOTE — Telephone Encounter (Signed)
Patient returned call.  She will arrive at 9:45 am to fill out paperwork on Monday, 12/16.  No call back is required.

## 2018-07-22 NOTE — Telephone Encounter (Signed)
Noted.  Will close encounter.  

## 2018-07-22 NOTE — Progress Notes (Signed)
DOS  07/05/2018  [place] bunionectomy right with fixation.

## 2018-07-22 NOTE — Telephone Encounter (Signed)
Lm requesting that pt arrive 43min prior to 07/25/18 OV to complete ILD questionnaire.

## 2018-07-25 ENCOUNTER — Encounter: Payer: Self-pay | Admitting: Pulmonary Disease

## 2018-07-25 ENCOUNTER — Ambulatory Visit (INDEPENDENT_AMBULATORY_CARE_PROVIDER_SITE_OTHER): Payer: PPO | Admitting: Pulmonary Disease

## 2018-07-25 VITALS — BP 132/78 | HR 72 | Ht 62.5 in | Wt 203.8 lb

## 2018-07-25 DIAGNOSIS — J849 Interstitial pulmonary disease, unspecified: Secondary | ICD-10-CM | POA: Diagnosis not present

## 2018-07-25 NOTE — Patient Instructions (Signed)
We will schedule you for repeat spirometry, diffusion capacity and a 6-minute walk test in 3 months time Follow-up in clinic after test for review Continue your inhalers.

## 2018-07-25 NOTE — Progress Notes (Signed)
Cheryl Brown    [id number]    06/02/1936  Primary Care Physician:Gates, [name], MD  Referring Physician: [name], MD [address] [place], [place] [address]  Chief complaint: Follow up for emphysema, interstitial lung disease.  HPI: 82 year old with history of hypertension, diabetes, COPD, OSA (noncompliant with CPAP].   She was diagnosed with COPD in 2012.  Has been maintained on Advair.  Reports worsening dyspnea on exertion for the past few months- year.  Has dyspnea with rest, chronic cough with white mucus, wheezing, occasional fevers and chills at night.  She does not have seasonal allergies. + for acid reflux and indigestion.   Diagnosed with sleep apnea.  She has been intolerant of CPAP and is not interested in trying again. History noted for elevated ANA with positive ro antibody.  She was referred to Dr. Amil Amen in August 2018 but was told she did not have any rheumatologic issue.  She continues to have dry mouth, dry eyes.  Denies any joint pain, rash.  Office note from Dr. Amil Amen, rheumatology dated 05/20/17 Evaluated for positive ANA, SSA. Thought to be false-positive in spite of having xerostomia.  No systemic immunosuppression reccomended as there are no extraglandular manifestation.  04/05/2018- Scheduled for a bunion repair on rt foot on 8/27 but was canceled as anesthetist noted dyspnea, hypoxia  Pets: No pets Occupation: Retired from [employer] in 1979 Exposures: No known exposures, no mold, hot tub. ILD questionnaire-Negative for exposures. Smoking history: 30-pack-year smoking history.  Quit in 1988 Travel History: Lived in [place] all her life.  No significant travel history.  Interim history: Continues on Trelegy inhaler and supplemental oxygen. States that breathing is stable.  She wants to know if she can get off oxygen.  Since last visit she underwent bunion foot surgery without any problems.  Outpatient Encounter  Medications as of 07/25/2018  Medication Sig  . amitriptyline (ELAVIL) 50 MG tablet Take 50 mg by mouth at bedtime.  . BD PEN NEEDLE NANO U/F 32G X 4 MM MISC use as directed once daily  . furosemide (LASIX) 20 MG tablet Take 1 tablet (20 mg total) by mouth daily. (Patient taking differently: Take 20 mg by mouth daily as needed for fluid. )  . glucose blood (ONE TOUCH ULTRA TEST) test strip 1 each by Other route daily. Use to check blood sugar daily Dx: E11.41  . Insulin Glargine (TOUJEO SOLOSTAR [place]) Inject 22 Units into the skin every morning.  [name] losartan (COZAAR) 50 MG tablet Take 50 mg by mouth daily.  [name] DELICA LANCETS MISC Check blood sugar as directed  . polyethylene glycol powder (MIRALAX) powder Take 34 g by mouth daily. 2 capfuls daily per Urologist   . pramipexole (MIRAPEX) 0.25 MG tablet Take 1 tablet (0.25 mg total) by mouth at bedtime.  [name] PREMARIN vaginal cream Place 1 application vaginally every other day.   No facility-administered encounter medications on file as of 07/25/2018.    Physical Exam: Blood pressure 132/78, pulse 72, height 5' 2.5" (1.588 m), weight 203 lb 12.8 oz (92.4 kg), SpO2 90 %. Gen:      No acute distress HEENT:  EOMI, sclera anicteric Neck:     No masses; no thyromegaly Lungs:    Bibasilar crackles. CV:         Regular rate and rhythm; no murmurs Abd:      + bowel sounds; soft, non-tender; no palpable masses, no distension Ext:  No edema; adequate peripheral perfusion Skin:      Warm and dry; no rash Neuro: alert and oriented x 3 Psych: normal mood and affect  Data Reviewed: Imaging CT abdomen pelvis 08/28/16-bibasilar opacities, atelectasis Chest x-ray 08/28/16-coarse interstitial marking, nodular opacity in the left midlung region. Chest x-ray 09/10/17- stable coarse interstitial markings. High-resolution CT chest 09/15/17- bronchial wall thickening with mild emphysema.  Diffuse groundglass attenuation, septal thickening, bronchiectasis with  moderate air trapping.  High-resolution CT 04/14/18-mild emphysema.  Stable findings of groundglass opacities, septal thickening, bronchiectasis with air-trapping.  Indeterminate for UIP.  Left main and three-vessel coronary artery disease. I have reviewed the images personally.  PFTs  09/21/17 FVC 1.96 [79%], FEV1 1.64 [90%], F/F 84, TLC 73%, DLCO 55% Mild restriction with severe diffusion defect that corrects for alveolar volume.  04/22/2018 FVC 2.0 [82%], FEV1 1.73 [96%], F/F 87, TLC 74%, DLCO 54% Mild restriction with severe diffusion defect that corrects for alveolar volume.  FENO 09/10/17- 9  6-minute walk test 11/02/17-  Starting heart rate, sats-69, 93% Starting heart rate, sats-108, 94% Distance walked 408,  Patient stopped with 30 seconds left due to SOB and leg cramps.  Labs: 04/05/17-positive ANA, Ro antibody 1.4  Repeat labs 09/21/17 Blood allergy profile-negative, IgE 4 ILD panel-ANA negative, angiotensin-converting enzyme-86 Rheumatoid factor, CCP-negative Ro antibody 1.1 La negative Hypersensitivity panel-negative  Cardiac Echocardiogram 09/24/17- Normal LV size with mild LV hypertrophy. EF 55-60%. Normal RV size and systolic function. No significant valvular abnormalities. PA peak pressure 31  Cardiac stress test 09/24/2016- low risk study, EF 55-65%.  Assessment:  Emphysema PFTs shows mild emphysematous changes.  There is no evidence of obstruction on PFTs.  Continue is on trelegy inhaler Continue supplemental oxygen.   Interstitial lung disease CT scan reviewed which shows nonspecific interstitial lung disease with air trapping.  Looks like chronic HP but does not have any exposure suggestive of hypersensitivity pneumonitis.  She has been evaluated by Dr. Amil Amen in the past for positive ANA, Ro antibody and was told that there is no evidence of rheumatologic issues. Repeat ILD panel noted for mild elevation in ACE level and persistent positive Ro antibody.     Repeat high-resolution CT shows stable findings with no progression.  She also has stable minimal restriction and moderate diffusion defect. Discussed further work-up and possibly a bronchoscopy with biopsy but she would like to hold off for now We will continue to monitor.  Repeat 6-minute walk test and spirometry, diffusion capacity in 3 months.  GERD She has ongoing GERD and may have reflux contributing to ILD.   Continue prilosec  Plan/Recommendations: - Continue trelegy, supplemental oxygen - Repeat spirometry, diffusion capacity and 6-minute walk test.  Marshell Garfinkel MD [facility] 07/25/2018, 10:35 AM

## 2018-07-27 ENCOUNTER — Ambulatory Visit (INDEPENDENT_AMBULATORY_CARE_PROVIDER_SITE_OTHER): Payer: PPO | Admitting: Podiatry

## 2018-07-27 ENCOUNTER — Ambulatory Visit (INDEPENDENT_AMBULATORY_CARE_PROVIDER_SITE_OTHER): Payer: PPO

## 2018-07-27 ENCOUNTER — Encounter: Payer: Self-pay | Admitting: Podiatry

## 2018-07-27 DIAGNOSIS — M779 Enthesopathy, unspecified: Secondary | ICD-10-CM

## 2018-07-27 DIAGNOSIS — M7752 Other enthesopathy of left foot: Secondary | ICD-10-CM | POA: Diagnosis not present

## 2018-07-27 DIAGNOSIS — M2012 Hallux valgus (acquired), left foot: Secondary | ICD-10-CM

## 2018-07-27 DIAGNOSIS — M2011 Hallux valgus (acquired), right foot: Secondary | ICD-10-CM

## 2018-07-27 DIAGNOSIS — J439 Emphysema, unspecified: Secondary | ICD-10-CM | POA: Diagnosis not present

## 2018-07-27 DIAGNOSIS — L84 Corns and callosities: Secondary | ICD-10-CM

## 2018-07-27 MED ORDER — TRIAMCINOLONE ACETONIDE 10 MG/ML IJ SUSP
10.0000 mg | Freq: Once | INTRAMUSCULAR | Status: AC
  Administered 2018-07-27: 10 mg

## 2018-07-27 NOTE — Progress Notes (Signed)
Subjective:   Patient ID: Cheryl Brown, female   DOB: 82 y.o.   MRN: [id number]   HPI Patient states very happy with the right foot but having a lot of pain underneath the left foot with fluid buildup and states it feels like she is walking on bone   ROS      Objective:  Physical Exam  Neurovascular status intact with well-healed surgical site first metatarsal right with excellent alignment and no pain with a left MPJ showing plantar capsular inflammation with fluid buildup and severe keratotic lesion formation     Assessment:  Inflammatory capsulitis fifth MPJ left with keratotic tissue formation is painful and doing very well from structural bunion deformity right     Plan:  For the right foot may begin normal activities and shoe gear and for the left I did do a sterile prep injected the plantar capsule 3 mg Dexasone Kenalog 5 mg Xylocaine and did sterile deep debridement of lesion which gave complete relief of symptoms  X-ray right indicates satisfactory resection of bone with good alignment

## 2018-08-01 DIAGNOSIS — E1140 Type 2 diabetes mellitus with diabetic neuropathy, unspecified: Secondary | ICD-10-CM | POA: Diagnosis not present

## 2018-08-01 DIAGNOSIS — E119 Type 2 diabetes mellitus without complications: Secondary | ICD-10-CM | POA: Diagnosis not present

## 2018-08-01 DIAGNOSIS — I10 Essential (primary) hypertension: Secondary | ICD-10-CM | POA: Diagnosis not present

## 2018-08-01 DIAGNOSIS — M189 Osteoarthritis of first carpometacarpal joint, unspecified: Secondary | ICD-10-CM | POA: Diagnosis not present

## 2018-08-01 DIAGNOSIS — J441 Chronic obstructive pulmonary disease with (acute) exacerbation: Secondary | ICD-10-CM | POA: Diagnosis not present

## 2018-08-01 DIAGNOSIS — M19039 Primary osteoarthritis, unspecified wrist: Secondary | ICD-10-CM | POA: Diagnosis not present

## 2018-08-01 DIAGNOSIS — J439 Emphysema, unspecified: Secondary | ICD-10-CM | POA: Diagnosis not present

## 2018-08-01 DIAGNOSIS — J449 Chronic obstructive pulmonary disease, unspecified: Secondary | ICD-10-CM | POA: Diagnosis not present

## 2018-08-11 DIAGNOSIS — E559 Vitamin D deficiency, unspecified: Secondary | ICD-10-CM | POA: Diagnosis not present

## 2018-08-11 DIAGNOSIS — G2581 Restless legs syndrome: Secondary | ICD-10-CM | POA: Diagnosis not present

## 2018-08-11 DIAGNOSIS — E669 Obesity, unspecified: Secondary | ICD-10-CM | POA: Diagnosis not present

## 2018-08-11 DIAGNOSIS — B354 Tinea corporis: Secondary | ICD-10-CM | POA: Diagnosis not present

## 2018-08-11 DIAGNOSIS — H6981 Other specified disorders of Eustachian tube, right ear: Secondary | ICD-10-CM | POA: Diagnosis not present

## 2018-08-11 DIAGNOSIS — N952 Postmenopausal atrophic vaginitis: Secondary | ICD-10-CM | POA: Diagnosis not present

## 2018-08-11 DIAGNOSIS — K219 Gastro-esophageal reflux disease without esophagitis: Secondary | ICD-10-CM | POA: Diagnosis not present

## 2018-08-11 DIAGNOSIS — R0902 Hypoxemia: Secondary | ICD-10-CM | POA: Diagnosis not present

## 2018-08-11 DIAGNOSIS — I10 Essential (primary) hypertension: Secondary | ICD-10-CM | POA: Diagnosis not present

## 2018-08-11 DIAGNOSIS — E1140 Type 2 diabetes mellitus with diabetic neuropathy, unspecified: Secondary | ICD-10-CM | POA: Diagnosis not present

## 2018-08-11 DIAGNOSIS — E119 Type 2 diabetes mellitus without complications: Secondary | ICD-10-CM | POA: Diagnosis not present

## 2018-08-11 DIAGNOSIS — J449 Chronic obstructive pulmonary disease, unspecified: Secondary | ICD-10-CM | POA: Diagnosis not present

## 2018-08-19 ENCOUNTER — Ambulatory Visit: Payer: PPO

## 2018-08-24 ENCOUNTER — Ambulatory Visit (INDEPENDENT_AMBULATORY_CARE_PROVIDER_SITE_OTHER): Payer: PPO | Admitting: Podiatry

## 2018-08-24 ENCOUNTER — Ambulatory Visit (INDEPENDENT_AMBULATORY_CARE_PROVIDER_SITE_OTHER): Payer: PPO

## 2018-08-24 ENCOUNTER — Encounter: Payer: Self-pay | Admitting: Podiatry

## 2018-08-24 DIAGNOSIS — M2011 Hallux valgus (acquired), right foot: Secondary | ICD-10-CM

## 2018-08-24 NOTE — Progress Notes (Signed)
Subjective:   Patient ID: Cheryl Brown, female   DOB: 83 y.o.   MRN: [id number]   HPI Patient states that doing quite a bit better and still have mild swelling in my left foot feels better where the procedure was done last visit   ROS      Objective:  Physical Exam  Neurovascular status intact with patient's right foot healing well post first metatarsal bunionectomy left foot inflammation reduced around the fifth MP joint     Assessment:  Doing better after having had surgery of the right foot with good range of motion mild swelling consistent with the postoperative.  And left foot healing well from inflammatory capsulitis     Plan:  H&P reviewed both conditions and recommended the continuation of range of motion exercises right and wider shoe and patient's discharge will be seen back as needed  X-ray indicates satisfactory resection of bone medial side right first metatarsal with no indications of pathology

## 2018-08-27 DIAGNOSIS — J439 Emphysema, unspecified: Secondary | ICD-10-CM | POA: Diagnosis not present

## 2018-09-01 DIAGNOSIS — E1140 Type 2 diabetes mellitus with diabetic neuropathy, unspecified: Secondary | ICD-10-CM | POA: Diagnosis not present

## 2018-09-01 DIAGNOSIS — J441 Chronic obstructive pulmonary disease with (acute) exacerbation: Secondary | ICD-10-CM | POA: Diagnosis not present

## 2018-09-01 DIAGNOSIS — G2581 Restless legs syndrome: Secondary | ICD-10-CM | POA: Diagnosis not present

## 2018-09-02 DIAGNOSIS — J441 Chronic obstructive pulmonary disease with (acute) exacerbation: Secondary | ICD-10-CM | POA: Diagnosis not present

## 2018-09-02 DIAGNOSIS — E1140 Type 2 diabetes mellitus with diabetic neuropathy, unspecified: Secondary | ICD-10-CM | POA: Diagnosis not present

## 2018-09-02 DIAGNOSIS — I10 Essential (primary) hypertension: Secondary | ICD-10-CM | POA: Diagnosis not present

## 2018-09-02 DIAGNOSIS — M19039 Primary osteoarthritis, unspecified wrist: Secondary | ICD-10-CM | POA: Diagnosis not present

## 2018-09-02 DIAGNOSIS — M189 Osteoarthritis of first carpometacarpal joint, unspecified: Secondary | ICD-10-CM | POA: Diagnosis not present

## 2018-09-02 DIAGNOSIS — E119 Type 2 diabetes mellitus without complications: Secondary | ICD-10-CM | POA: Diagnosis not present

## 2018-09-02 DIAGNOSIS — J439 Emphysema, unspecified: Secondary | ICD-10-CM | POA: Diagnosis not present

## 2018-09-02 DIAGNOSIS — J449 Chronic obstructive pulmonary disease, unspecified: Secondary | ICD-10-CM | POA: Diagnosis not present

## 2018-09-19 ENCOUNTER — Telehealth: Payer: Self-pay | Admitting: Pulmonary Disease

## 2018-09-19 DIAGNOSIS — J849 Interstitial pulmonary disease, unspecified: Secondary | ICD-10-CM

## 2018-09-20 DIAGNOSIS — L820 Inflamed seborrheic keratosis: Secondary | ICD-10-CM | POA: Diagnosis not present

## 2018-09-20 NOTE — Telephone Encounter (Signed)
Spoke with pt, states that she is having a dry mouth, dry cracked blistered lips, dry scratchy throat X2 weeks.  States that she believes that this is coming from her O2 use.  Pt has been using Biotene mouth rinse and blistex chapstick to help with s/s without any relief.  Denies any fever, sinus congestion.  Pt has not started any new meds in the past month.    Pt is requesting additional recs.   Pharmacy: [facility].    Dr Vaughan Browner please advise on recs.  Thanks!

## 2018-09-20 NOTE — Telephone Encounter (Signed)
LVM for patient to advise order to be sent to DME [facility]) Order placed for humidifier to be added to the O2 circuit. Requested patient call back to confirm the message was received.

## 2018-09-20 NOTE — Telephone Encounter (Signed)
Called patient, unable to reach left message to give us a call back. 

## 2018-09-20 NOTE — Telephone Encounter (Signed)
Pt is returning call CB# [phone]//kob

## 2018-09-20 NOTE — Telephone Encounter (Signed)
Can we check with DME if we can add a humidifier to the O2 circuit

## 2018-09-27 DIAGNOSIS — J439 Emphysema, unspecified: Secondary | ICD-10-CM | POA: Diagnosis not present

## 2018-09-30 DIAGNOSIS — L235 Allergic contact dermatitis due to other chemical products: Secondary | ICD-10-CM | POA: Diagnosis not present

## 2018-10-03 DIAGNOSIS — J439 Emphysema, unspecified: Secondary | ICD-10-CM | POA: Diagnosis not present

## 2018-10-03 DIAGNOSIS — J441 Chronic obstructive pulmonary disease with (acute) exacerbation: Secondary | ICD-10-CM | POA: Diagnosis not present

## 2018-10-03 DIAGNOSIS — J449 Chronic obstructive pulmonary disease, unspecified: Secondary | ICD-10-CM | POA: Diagnosis not present

## 2018-10-03 DIAGNOSIS — M189 Osteoarthritis of first carpometacarpal joint, unspecified: Secondary | ICD-10-CM | POA: Diagnosis not present

## 2018-10-03 DIAGNOSIS — E119 Type 2 diabetes mellitus without complications: Secondary | ICD-10-CM | POA: Diagnosis not present

## 2018-10-03 DIAGNOSIS — E1140 Type 2 diabetes mellitus with diabetic neuropathy, unspecified: Secondary | ICD-10-CM | POA: Diagnosis not present

## 2018-10-03 DIAGNOSIS — M19039 Primary osteoarthritis, unspecified wrist: Secondary | ICD-10-CM | POA: Diagnosis not present

## 2018-10-03 DIAGNOSIS — I10 Essential (primary) hypertension: Secondary | ICD-10-CM | POA: Diagnosis not present

## 2018-10-06 DIAGNOSIS — E119 Type 2 diabetes mellitus without complications: Secondary | ICD-10-CM | POA: Diagnosis not present

## 2018-10-06 DIAGNOSIS — L235 Allergic contact dermatitis due to other chemical products: Secondary | ICD-10-CM | POA: Diagnosis not present

## 2018-10-06 DIAGNOSIS — B354 Tinea corporis: Secondary | ICD-10-CM | POA: Diagnosis not present

## 2018-10-06 DIAGNOSIS — J449 Chronic obstructive pulmonary disease, unspecified: Secondary | ICD-10-CM | POA: Diagnosis not present

## 2018-10-06 DIAGNOSIS — I10 Essential (primary) hypertension: Secondary | ICD-10-CM | POA: Diagnosis not present

## 2018-10-07 ENCOUNTER — Ambulatory Visit
Admission: RE | Admit: 2018-10-07 | Discharge: 2018-10-07 | Disposition: A | Discharge status: Home / Self Care | Payer: PPO | Source: Ambulatory Visit | Attending: Internal Medicine | Admitting: Internal Medicine

## 2018-10-07 DIAGNOSIS — Z1231 Encounter for screening mammogram for malignant neoplasm of breast: Secondary | ICD-10-CM | POA: Diagnosis not present

## 2018-10-17 DIAGNOSIS — L235 Allergic contact dermatitis due to other chemical products: Secondary | ICD-10-CM | POA: Diagnosis not present

## 2018-10-17 DIAGNOSIS — I10 Essential (primary) hypertension: Secondary | ICD-10-CM | POA: Diagnosis not present

## 2018-10-17 DIAGNOSIS — J449 Chronic obstructive pulmonary disease, unspecified: Secondary | ICD-10-CM | POA: Diagnosis not present

## 2018-10-17 DIAGNOSIS — B354 Tinea corporis: Secondary | ICD-10-CM | POA: Diagnosis not present

## 2018-10-17 DIAGNOSIS — E119 Type 2 diabetes mellitus without complications: Secondary | ICD-10-CM | POA: Diagnosis not present

## 2018-10-28 DIAGNOSIS — E1140 Type 2 diabetes mellitus with diabetic neuropathy, unspecified: Secondary | ICD-10-CM | POA: Diagnosis not present

## 2018-10-28 DIAGNOSIS — E119 Type 2 diabetes mellitus without complications: Secondary | ICD-10-CM | POA: Diagnosis not present

## 2018-10-28 DIAGNOSIS — J439 Emphysema, unspecified: Secondary | ICD-10-CM | POA: Diagnosis not present

## 2018-10-28 DIAGNOSIS — M189 Osteoarthritis of first carpometacarpal joint, unspecified: Secondary | ICD-10-CM | POA: Diagnosis not present

## 2018-10-28 DIAGNOSIS — J441 Chronic obstructive pulmonary disease with (acute) exacerbation: Secondary | ICD-10-CM | POA: Diagnosis not present

## 2018-10-28 DIAGNOSIS — I10 Essential (primary) hypertension: Secondary | ICD-10-CM | POA: Diagnosis not present

## 2018-10-28 DIAGNOSIS — J449 Chronic obstructive pulmonary disease, unspecified: Secondary | ICD-10-CM | POA: Diagnosis not present

## 2018-10-28 DIAGNOSIS — M19039 Primary osteoarthritis, unspecified wrist: Secondary | ICD-10-CM | POA: Diagnosis not present

## 2018-11-02 ENCOUNTER — Ambulatory Visit: Payer: PPO

## 2018-11-02 ENCOUNTER — Ambulatory Visit: Payer: PPO | Admitting: Pulmonary Disease

## 2018-11-26 DIAGNOSIS — J439 Emphysema, unspecified: Secondary | ICD-10-CM | POA: Diagnosis not present

## 2018-11-28 DIAGNOSIS — E119 Type 2 diabetes mellitus without complications: Secondary | ICD-10-CM | POA: Diagnosis not present

## 2018-11-28 DIAGNOSIS — E669 Obesity, unspecified: Secondary | ICD-10-CM | POA: Diagnosis not present

## 2018-11-28 DIAGNOSIS — E1140 Type 2 diabetes mellitus with diabetic neuropathy, unspecified: Secondary | ICD-10-CM | POA: Diagnosis not present

## 2018-11-28 DIAGNOSIS — I10 Essential (primary) hypertension: Secondary | ICD-10-CM | POA: Diagnosis not present

## 2018-11-28 DIAGNOSIS — Z794 Long term (current) use of insulin: Secondary | ICD-10-CM | POA: Diagnosis not present

## 2018-11-28 DIAGNOSIS — G2581 Restless legs syndrome: Secondary | ICD-10-CM | POA: Diagnosis not present

## 2018-11-28 DIAGNOSIS — K219 Gastro-esophageal reflux disease without esophagitis: Secondary | ICD-10-CM | POA: Diagnosis not present

## 2018-11-28 DIAGNOSIS — K5900 Constipation, unspecified: Secondary | ICD-10-CM | POA: Diagnosis not present

## 2018-11-28 DIAGNOSIS — J849 Interstitial pulmonary disease, unspecified: Secondary | ICD-10-CM | POA: Diagnosis not present

## 2018-11-28 DIAGNOSIS — J449 Chronic obstructive pulmonary disease, unspecified: Secondary | ICD-10-CM | POA: Diagnosis not present

## 2018-11-28 DIAGNOSIS — E559 Vitamin D deficiency, unspecified: Secondary | ICD-10-CM | POA: Diagnosis not present

## 2018-12-05 DIAGNOSIS — I10 Essential (primary) hypertension: Secondary | ICD-10-CM | POA: Diagnosis not present

## 2018-12-05 DIAGNOSIS — M189 Osteoarthritis of first carpometacarpal joint, unspecified: Secondary | ICD-10-CM | POA: Diagnosis not present

## 2018-12-05 DIAGNOSIS — M19039 Primary osteoarthritis, unspecified wrist: Secondary | ICD-10-CM | POA: Diagnosis not present

## 2018-12-05 DIAGNOSIS — J449 Chronic obstructive pulmonary disease, unspecified: Secondary | ICD-10-CM | POA: Diagnosis not present

## 2018-12-05 DIAGNOSIS — J441 Chronic obstructive pulmonary disease with (acute) exacerbation: Secondary | ICD-10-CM | POA: Diagnosis not present

## 2018-12-05 DIAGNOSIS — E1140 Type 2 diabetes mellitus with diabetic neuropathy, unspecified: Secondary | ICD-10-CM | POA: Diagnosis not present

## 2018-12-05 DIAGNOSIS — E119 Type 2 diabetes mellitus without complications: Secondary | ICD-10-CM | POA: Diagnosis not present

## 2018-12-05 DIAGNOSIS — J439 Emphysema, unspecified: Secondary | ICD-10-CM | POA: Diagnosis not present

## 2018-12-26 DIAGNOSIS — J439 Emphysema, unspecified: Secondary | ICD-10-CM | POA: Diagnosis not present

## 2019-01-05 DIAGNOSIS — J449 Chronic obstructive pulmonary disease, unspecified: Secondary | ICD-10-CM | POA: Diagnosis not present

## 2019-01-05 DIAGNOSIS — M19039 Primary osteoarthritis, unspecified wrist: Secondary | ICD-10-CM | POA: Diagnosis not present

## 2019-01-05 DIAGNOSIS — I10 Essential (primary) hypertension: Secondary | ICD-10-CM | POA: Diagnosis not present

## 2019-01-05 DIAGNOSIS — J441 Chronic obstructive pulmonary disease with (acute) exacerbation: Secondary | ICD-10-CM | POA: Diagnosis not present

## 2019-01-05 DIAGNOSIS — E119 Type 2 diabetes mellitus without complications: Secondary | ICD-10-CM | POA: Diagnosis not present

## 2019-01-05 DIAGNOSIS — J439 Emphysema, unspecified: Secondary | ICD-10-CM | POA: Diagnosis not present

## 2019-01-05 DIAGNOSIS — M189 Osteoarthritis of first carpometacarpal joint, unspecified: Secondary | ICD-10-CM | POA: Diagnosis not present

## 2019-01-05 DIAGNOSIS — E1140 Type 2 diabetes mellitus with diabetic neuropathy, unspecified: Secondary | ICD-10-CM | POA: Diagnosis not present

## 2019-01-06 ENCOUNTER — Other Ambulatory Visit: Payer: Self-pay | Admitting: Pulmonary Disease

## 2019-01-16 DIAGNOSIS — I10 Essential (primary) hypertension: Secondary | ICD-10-CM | POA: Diagnosis not present

## 2019-01-16 DIAGNOSIS — Z01419 Encounter for gynecological examination (general) (routine) without abnormal findings: Secondary | ICD-10-CM | POA: Diagnosis not present

## 2019-01-16 DIAGNOSIS — L299 Pruritus, unspecified: Secondary | ICD-10-CM | POA: Diagnosis not present

## 2019-01-16 DIAGNOSIS — R600 Localized edema: Secondary | ICD-10-CM | POA: Diagnosis not present

## 2019-01-16 DIAGNOSIS — K5900 Constipation, unspecified: Secondary | ICD-10-CM | POA: Diagnosis not present

## 2019-01-16 DIAGNOSIS — J449 Chronic obstructive pulmonary disease, unspecified: Secondary | ICD-10-CM | POA: Diagnosis not present

## 2019-01-16 DIAGNOSIS — E1140 Type 2 diabetes mellitus with diabetic neuropathy, unspecified: Secondary | ICD-10-CM | POA: Diagnosis not present

## 2019-01-19 ENCOUNTER — Other Ambulatory Visit (HOSPITAL_COMMUNITY)
Admission: RE | Admit: 2019-01-19 | Discharge: 2019-01-19 | Disposition: A | Discharge status: Home / Self Care | Payer: PPO | Source: Ambulatory Visit | Attending: Pulmonary Disease | Admitting: Pulmonary Disease

## 2019-01-19 DIAGNOSIS — Z1159 Encounter for screening for other viral diseases: Secondary | ICD-10-CM | POA: Insufficient documentation

## 2019-01-20 DIAGNOSIS — R1084 Generalized abdominal pain: Secondary | ICD-10-CM | POA: Diagnosis not present

## 2019-01-20 DIAGNOSIS — R933 Abnormal findings on diagnostic imaging of other parts of digestive tract: Secondary | ICD-10-CM | POA: Diagnosis not present

## 2019-01-20 DIAGNOSIS — K5900 Constipation, unspecified: Secondary | ICD-10-CM | POA: Diagnosis not present

## 2019-01-20 DIAGNOSIS — R112 Nausea with vomiting, unspecified: Secondary | ICD-10-CM | POA: Diagnosis not present

## 2019-01-20 LAB — NOVEL CORONAVIRUS, NAA (HOSP ORDER, SEND-OUT TO REF LAB; TAT 18-24 HRS): SARS-CoV-2, NAA: NOT DETECTED

## 2019-01-23 ENCOUNTER — Ambulatory Visit (INDEPENDENT_AMBULATORY_CARE_PROVIDER_SITE_OTHER): Payer: PPO | Admitting: Pulmonary Disease

## 2019-01-23 ENCOUNTER — Other Ambulatory Visit: Payer: Self-pay

## 2019-01-23 DIAGNOSIS — J849 Interstitial pulmonary disease, unspecified: Secondary | ICD-10-CM

## 2019-01-23 DIAGNOSIS — Z1231 Encounter for screening mammogram for malignant neoplasm of breast: Secondary | ICD-10-CM | POA: Diagnosis not present

## 2019-01-23 LAB — PULMONARY FUNCTION TEST
DL/VA % pred: 82 %
DL/VA: 3.39 ml/min/mmHg/L
DLCO UNC % PRED: 61 %
DLCO unc: 10.92 ml/min/mmHg
FEF 25-75 PRE: 1.46 L/s
FEF2575-%PRED-PRE: 110 %
FEV1-%PRED-PRE: 86 %
FEV1-PRE: 1.56 L
FEV1FVC-%PRED-PRE: 108 %
FEV6-%Pred-Pre: 84 %
FEV6-Pre: 1.94 L
FEV6FVC-%Pred-Pre: 105 %
FVC-%PRED-PRE: 80 %
FVC-Pre: 1.95 L
PRE FEV6/FVC RATIO: 100 %
Pre FEV1/FVC ratio: 80 %

## 2019-01-23 NOTE — Progress Notes (Signed)
Spirometry and Dlco done today. 

## 2019-01-24 ENCOUNTER — Other Ambulatory Visit: Payer: Self-pay | Admitting: Gastroenterology

## 2019-01-24 ENCOUNTER — Ambulatory Visit: Payer: PPO

## 2019-01-24 ENCOUNTER — Ambulatory Visit (INDEPENDENT_AMBULATORY_CARE_PROVIDER_SITE_OTHER): Payer: PPO | Admitting: Adult Health

## 2019-01-24 ENCOUNTER — Encounter: Payer: Self-pay | Admitting: Adult Health

## 2019-01-24 DIAGNOSIS — J849 Interstitial pulmonary disease, unspecified: Secondary | ICD-10-CM | POA: Diagnosis not present

## 2019-01-24 DIAGNOSIS — J449 Chronic obstructive pulmonary disease, unspecified: Secondary | ICD-10-CM | POA: Diagnosis not present

## 2019-01-24 DIAGNOSIS — R1084 Generalized abdominal pain: Secondary | ICD-10-CM

## 2019-01-24 DIAGNOSIS — J9611 Chronic respiratory failure with hypoxia: Secondary | ICD-10-CM | POA: Diagnosis not present

## 2019-01-24 NOTE — Progress Notes (Signed)
SIX MIN WALK 01/24/2019 07/25/2018 07/25/2018 04/22/2018 04/22/2018 04/22/2018 04/07/2018  Medications lasix 20mg  at 0800, losartan potassium 50mg  0800 - - - - - -  Supplimental Oxygen during Test? (L/min) No Yes No Yes Yes No No  O2 Flow Rate - 3 - 3 2 - -  Type - Pulse - Continuous Continuous - -  Laps 10 - - - - - -  Partial Lap (in Meters) 0 - - - - - -  Baseline BP (sitting) 122/70 - - - - - -  Baseline Heartrate 71 - - - - - -  Baseline Dyspnea (Borg Scale) 2 - - - - - -  Baseline Fatigue (Borg Scale) 2 - - - - - -  Baseline SPO2 94 - - - - - -  BP (sitting) 130/78 - - - - - -  Heartrate 90 - - - - - -  Dyspnea (Borg Scale) 2 - - - - - -  Fatigue (Borg Scale) 4 - - - - - -  SPO2 87 - - - - - -  BP (sitting) 130/76 - - - - - -  Heartrate 85 - - - - - -  SPO2 98 - - - - - -  Stopped or Paused before Six Minutes No - - - - - -  Other Symptoms at end of Exercise - - - - - - -  Interpretation (No Data) - - - - - -  Distance Completed 340 - - - - - -  Tech Comments: walked at a very steady, fast pace - - Pt's O2 sats maintained stable on 3L O2. Pt had complaints of mild SOB Pt stopped and placed on 3L Pt was stopped and placed on O2 -

## 2019-01-24 NOTE — Assessment & Plan Note (Signed)
Stable COPD continue on current regimen  Plan  Patient Instructions  Continue on TRELEGY 1 puff daily , rinse after use.  Continue on Oxygen  Continue with activity as tolerated.  Follow up with Dr. Vaughan Browner in 3-4 months and As needed     '

## 2019-01-24 NOTE — Patient Instructions (Signed)
Continue on TRELEGY 1 puff daily , rinse after use.  Continue on Oxygen  Continue with activity as tolerated.  Follow up with Dr. Vaughan Browner in 3-4 months and As needed

## 2019-01-24 NOTE — Assessment & Plan Note (Signed)
Cont on O2 .  

## 2019-01-24 NOTE — Assessment & Plan Note (Signed)
ILD-questionable etiology.  Rheumatology work-up has been unrevealing. Stable changes on high-resolution CT chest September 2019.  PFT is stable with DLCO slightly improved since last year.  6-minute walk did show a clinical decline.  We will continue to monitor.  Previously had been discussed regarding bronchoscopy as this may be underlying chronic HP however patient does not have any obvious risk factors.  Patient would like to avoid this if possible.  Continue to monitor.  Patient is to follow back up in 3 to 4 months.

## 2019-01-24 NOTE — Progress Notes (Signed)
@Patient  ID: Cheryl Brown, female    DOB: 04-20-1936, 83 y.o.   MRN: [id number]  Chief Complaint  Patient presents with  . Follow-up    Referring provider: [name], MD  HPI: 83 year old female former smoker followed for COPD with emphysema, OSA-CPAP intolerant, interstitial lung disease   TEST/EVENTS :  CT abdomen pelvis 08/28/16-bibasilar opacities, atelectasis Chest x-ray 08/28/16-coarse interstitial marking, nodular opacity in the left midlung region. Chest x-ray 09/10/17- stable coarse interstitial markings. High-resolution CT chest 09/15/17- bronchial wall thickening with mild emphysema.  Diffuse groundglass attenuation, septal thickening, bronchiectasis with moderate air trapping.  High-resolution CT 04/14/18-mild emphysema.  Stable findings of groundglass opacities, septal thickening, bronchiectasis with air-trapping.  Indeterminate for UIP.  Left main and three-vessel coronary artery disease. I have reviewed the images personally.  PFTs  09/21/17 FVC 1.96 [79%], FEV1 1.64 [90%], F/F 84, TLC 73%, DLCO 55% Mild restriction with severe diffusion defect that corrects for alveolar volume.  04/22/2018 FVC 2.0 [82%], FEV1 1.73 [96%], F/F 87, TLC 74%, DLCO 54% Mild restriction with severe diffusion defect that corrects for alveolar volume.  FENO 09/10/17- 9  6-minute walk test 11/02/17-  Starting heart rate, sats-69, 93% Starting heart rate, sats-108, 94% Distance walked 408,  Patient stopped with 30 seconds left due to SOB and leg cramps.  Labs: 04/05/17-positive ANA, Ro antibody 1.4  Repeat labs 09/21/17 Blood allergy profile-negative, IgE 4 ILD panel-ANA negative, angiotensin-converting enzyme-86 Rheumatoid factor, CCP-negative Ro antibody 1.1 La negative Hypersensitivity panel-negative  Cardiac Echocardiogram 09/24/17- Normal LV size with mild LV hypertrophy. EF 55-60%. Normal RV size and systolic function. No significant valvular abnormalities. PA peak  pressure 31  Cardiac stress test 09/24/2016- low risk study, EF 55-65%.  .01/24/2019 Follow up : COPD -Emphysema, ILD , O2 RF  Patient returns for a 21-month follow-up.  Patient has underlying emphysema.  She remains on Trelegy daily.  Patient says overall breathing is doing about the same.  She denies any increased cough or shortness of breath.  She says she is able to do light activities at home including making her bed light housework and cooking and driving.  He gets winded with heavy activity.  She is on oxygen 3 L with activity and bedtime.  She has some interstitial changes on CT chest.  High-resolution CT chest on September 2019 showed stable ILD changes with ground glass attenuation, septal thickening and mild bronchiectasis.  No honeycombing..  Mild emphysema. Today patient had PFT that showed stable lung function with an FEV1 at 86%, ratio 80, FVC 80%.  DLCO 61%.  This was improved since 2019 with DLCO at 54%. 6-minute walk test done on room air showed a decline with 340 m compared to March 2019 at 47 m Patient says overall she feels that her breathing is doing the same she has not seen a decline in her activity level or shortness of breath.    Allergies  Allergen Reactions  . Morphine And Related Shortness Of Breath  . Codeine     nausea  . Ipratropium-Albuterol     REACTION: VERY NERVOUS  . Neosporin [Neomycin-Bacitracin Zn-Polymyx]     "Had knee surgery and it did not help me heal"  . Niaspan [Niacin]     Abdominal pain  . Nitrofurantoin Macrocrystal Nausea Only  . Norvasc [Amlodipine Besylate]     10/15/14 headache 08/27/15 abdominal pain, indigestion  . Pramipexole Dihydrochloride     Rash & itching  . Pregabalin     REACTION: swelling  .  Propoxyphene N-Acetaminophen     REACTION: UPSET STOMACH  . Simvastatin     REACTION: Leg cramps  . Sulfa Antibiotics   . Sulfonamide Derivatives Itching    Immunization History  Administered Date(s) Administered  . Influenza  Whole 06/30/2007, 05/17/2008  . Influenza, High Dose Seasonal PF 05/17/2013, 05/15/2015, 05/24/2017  . Influenza,inj,Quad PF,6+ Mos 05/23/2014  . Pneumococcal Polysaccharide-23 05/10/2000, 05/10/2010    Past Medical History:  Diagnosis Date  . Blood transfusion   . COPD (chronic obstructive pulmonary disease) ([place])   . Diabetes mellitus   . Dysmetabolic syndrome   . Hyperlipemia   . Hypertension   . IBS (irritable bowel syndrome)   . Neoplasm of pituitary gland    Dr.Nudelman   . Palpitations   . Pituitary adenoma ([place]) 1991   Dr.Love and Dr.Nudelman   . Recurrent UTI 2013-14   Dr. Thomasene Mohair and Dr. Marvel Plan  . Reflux esophagitis   . Restless leg syndrome   . Scoliosis    Spinal Stenosis  . Shingles outbreak     Tobacco History: Social History   Tobacco Use  Smoking Status Former Smoker  . Packs/day: 0.50  . Years: 25.00  . Pack years: 12.50  . Types: Cigarettes  . Quit date: 08/10/1978  . Years since quitting: 40.4  Smokeless Tobacco Never Used   Counseling given: Not Answered   Outpatient Medications Prior to Visit  Medication Sig Dispense Refill  . albuterol (PROAIR HFA) 108 (90 Base) MCG/ACT inhaler Inhale 2 puffs into the lungs every 6 (six) hours as needed.     [name] amitriptyline (ELAVIL) 50 MG tablet Take 50 mg by mouth at bedtime.    . BD PEN NEEDLE NANO U/F 32G X 4 MM MISC use as directed once daily 100 each 10  . Fluticasone-Umeclidin-Vilant (TRELEGY ELLIPTA) 100-62.5-25 MCG/INH AEPB Inhale 2 puffs into the lungs daily.    . furosemide (LASIX) 20 MG tablet Take 1 tablet (20 mg total) by mouth daily. (Patient taking differently: Take 20 mg by mouth daily as needed for fluid. ) 30 tablet 6  . glucose blood (ONE TOUCH ULTRA TEST) test strip 1 each by Other route daily. Use to check blood sugar daily Dx: E11.41 90 each 3  . Insulin Glargine (TOUJEO SOLOSTAR [place]) Inject 40 Units into the skin daily. Takes at night    . losartan (COZAAR) 50 MG tablet Take 50 mg  by mouth daily.  1  . ONETOUCH DELICA LANCETS MISC Check blood sugar as directed 100 each 3  . polyethylene glycol powder (MIRALAX) powder Take 34 g by mouth daily. 2 capfuls daily per Urologist     . pramipexole (MIRAPEX) 0.25 MG tablet Take 1 tablet (0.25 mg total) by mouth at bedtime. 90 tablet 4  . PREMARIN vaginal cream Place 1 application vaginally every other day.     No facility-administered medications prior to visit.      Review of Systems:   Constitutional:   No  weight loss, night sweats,  Fevers, chills,  +fatigue, or  lassitude.  HEENT:   No headaches,  Difficulty swallowing,  Tooth/dental problems, or  Sore throat,                No sneezing, itching, ear ache, nasal congestion, post nasal drip,   CV:  No chest pain,  Orthopnea, PND, swelling in lower extremities, anasarca, dizziness, palpitations, syncope.   GI  No heartburn, indigestion, abdominal pain, nausea, vomiting, diarrhea, change in bowel habits, loss of appetite,  bloody stools.   Resp: deformity  Skin: no rash or lesions.  GU: no dysuria, change in color of urine, no urgency or frequency.  No flank pain, no hematuria   MS:  No joint pain or swelling.  No decreased range of motion.  No back pain.    Physical Exam  BP 130/76 (BP Location: Left Arm, Cuff Size: Normal)   Pulse 85   Temp 98.4 F (36.9 C) (Oral)   Ht 5\' 2"  (1.575 m)   Wt 206 lb (93.4 kg)   SpO2 98%   BMI 37.68 kg/m   GEN: A/Ox3; pleasant , NAD, obese    HEENT:  [place]/AT,  EACs-clear, TMs-wnl, NOSE-clear, THROAT-clear, no lesions, no postnasal drip or exudate noted.   NECK:  Supple w/ fair ROM; no JVD; normal carotid impulses w/o bruits; no thyromegaly or nodules palpated; no lymphadenopathy.    RESP  BB crackles ,  no accessory muscle use, no dullness to percussion  CARD:  RRR, no m/r/g, 1-2 +  peripheral edema, pulses intact, no cyanosis or clubbing.  GI:   Soft & nt; nml bowel sounds; no organomegaly or masses detected.    Musco: Warm bil, no deformities or joint swelling noted.   Neuro: alert, no focal deficits noted.    Skin: Warm, no lesions or rashes    Lab Results:  CBC  No results found.    PFT Results Latest Ref Rng & Units 01/23/2019 04/22/2018 09/21/2017  FVC-Pre L 1.95 1.99 1.97  FVC-Predicted Pre % 80 81 80  FVC-Post L - 2.00 1.96  FVC-Predicted Post % - 82 79  Pre FEV1/FVC % % 80 81 86  Post FEV1/FCV % % - 87 84  FEV1-Pre L 1.56 1.61 1.70  FEV1-Predicted Pre % 86 89 93  FEV1-Post L - 1.73 1.65  DLCO UNC% % 61 54 55  DLCO COR %Predicted % 82 81 80  TLC L - 3.59 3.54  TLC % Predicted % - 74 73  RV % Predicted % - 65 62    Lab Results  Component Value Date   NITRICOXIDE 9 09/10/2017        Assessment & Plan:   COPD with chronic bronchitis (HCC) Stable COPD continue on current regimen  Plan  Patient Instructions  Continue on TRELEGY 1 puff daily , rinse after use.  Continue on Oxygen  Continue with activity as tolerated.  Follow up with Dr. Vaughan Browner in 3-4 months and As needed     '  Chronic respiratory failure with hypoxia ([place]) Cont on O2 .   ILD (interstitial lung disease) ([place]) ILD-questionable etiology.  Rheumatology work-up has been unrevealing. Stable changes on high-resolution CT chest September 2019.  PFT is stable with DLCO slightly improved since last year.  6-minute walk did show a clinical decline.  We will continue to monitor.  Previously had been discussed regarding bronchoscopy as this may be underlying chronic HP however patient does not have any obvious risk factors.  Patient would like to avoid this if possible.  Continue to monitor.  Patient is to follow back up in 3 to 4 months.     Rexene Edison, NP 01/24/2019

## 2019-01-26 DIAGNOSIS — R933 Abnormal findings on diagnostic imaging of other parts of digestive tract: Secondary | ICD-10-CM | POA: Diagnosis not present

## 2019-01-26 DIAGNOSIS — J439 Emphysema, unspecified: Secondary | ICD-10-CM | POA: Diagnosis not present

## 2019-01-26 DIAGNOSIS — R1084 Generalized abdominal pain: Secondary | ICD-10-CM | POA: Diagnosis not present

## 2019-01-30 DIAGNOSIS — J449 Chronic obstructive pulmonary disease, unspecified: Secondary | ICD-10-CM | POA: Diagnosis not present

## 2019-01-30 DIAGNOSIS — M19039 Primary osteoarthritis, unspecified wrist: Secondary | ICD-10-CM | POA: Diagnosis not present

## 2019-01-30 DIAGNOSIS — R600 Localized edema: Secondary | ICD-10-CM | POA: Diagnosis not present

## 2019-01-30 DIAGNOSIS — J441 Chronic obstructive pulmonary disease with (acute) exacerbation: Secondary | ICD-10-CM | POA: Diagnosis not present

## 2019-01-30 DIAGNOSIS — L299 Pruritus, unspecified: Secondary | ICD-10-CM | POA: Diagnosis not present

## 2019-01-30 DIAGNOSIS — I10 Essential (primary) hypertension: Secondary | ICD-10-CM | POA: Diagnosis not present

## 2019-01-30 DIAGNOSIS — J439 Emphysema, unspecified: Secondary | ICD-10-CM | POA: Diagnosis not present

## 2019-01-30 DIAGNOSIS — K5900 Constipation, unspecified: Secondary | ICD-10-CM | POA: Diagnosis not present

## 2019-01-30 DIAGNOSIS — E1140 Type 2 diabetes mellitus with diabetic neuropathy, unspecified: Secondary | ICD-10-CM | POA: Diagnosis not present

## 2019-01-30 DIAGNOSIS — M189 Osteoarthritis of first carpometacarpal joint, unspecified: Secondary | ICD-10-CM | POA: Diagnosis not present

## 2019-01-30 DIAGNOSIS — E119 Type 2 diabetes mellitus without complications: Secondary | ICD-10-CM | POA: Diagnosis not present

## 2019-02-01 DIAGNOSIS — L820 Inflamed seborrheic keratosis: Secondary | ICD-10-CM | POA: Diagnosis not present

## 2019-02-01 DIAGNOSIS — X32XXXD Exposure to sunlight, subsequent encounter: Secondary | ICD-10-CM | POA: Diagnosis not present

## 2019-02-01 DIAGNOSIS — L570 Actinic keratosis: Secondary | ICD-10-CM | POA: Diagnosis not present

## 2019-02-06 ENCOUNTER — Ambulatory Visit
Admission: RE | Admit: 2019-02-06 | Discharge: 2019-02-06 | Disposition: A | Discharge status: Home / Self Care | Payer: PPO | Source: Ambulatory Visit | Attending: Gastroenterology | Admitting: Gastroenterology

## 2019-02-06 DIAGNOSIS — K5730 Diverticulosis of large intestine without perforation or abscess without bleeding: Secondary | ICD-10-CM | POA: Diagnosis not present

## 2019-02-06 DIAGNOSIS — N281 Cyst of kidney, acquired: Secondary | ICD-10-CM | POA: Diagnosis not present

## 2019-02-06 DIAGNOSIS — R1084 Generalized abdominal pain: Secondary | ICD-10-CM

## 2019-02-06 MED ORDER — IOPAMIDOL (ISOVUE-300) INJECTION 61%
100.0000 mL | Freq: Once | INTRAVENOUS | Status: AC | PRN
  Administered 2019-02-06: 14:00:00 100 mL via INTRAVENOUS

## 2019-02-25 DIAGNOSIS — J439 Emphysema, unspecified: Secondary | ICD-10-CM | POA: Diagnosis not present

## 2019-02-26 DIAGNOSIS — E119 Type 2 diabetes mellitus without complications: Secondary | ICD-10-CM | POA: Diagnosis not present

## 2019-02-26 DIAGNOSIS — J449 Chronic obstructive pulmonary disease, unspecified: Secondary | ICD-10-CM | POA: Diagnosis not present

## 2019-02-26 DIAGNOSIS — M19039 Primary osteoarthritis, unspecified wrist: Secondary | ICD-10-CM | POA: Diagnosis not present

## 2019-02-26 DIAGNOSIS — J441 Chronic obstructive pulmonary disease with (acute) exacerbation: Secondary | ICD-10-CM | POA: Diagnosis not present

## 2019-02-26 DIAGNOSIS — I10 Essential (primary) hypertension: Secondary | ICD-10-CM | POA: Diagnosis not present

## 2019-02-26 DIAGNOSIS — J439 Emphysema, unspecified: Secondary | ICD-10-CM | POA: Diagnosis not present

## 2019-02-26 DIAGNOSIS — M189 Osteoarthritis of first carpometacarpal joint, unspecified: Secondary | ICD-10-CM | POA: Diagnosis not present

## 2019-02-26 DIAGNOSIS — E1140 Type 2 diabetes mellitus with diabetic neuropathy, unspecified: Secondary | ICD-10-CM | POA: Diagnosis not present

## 2019-03-14 DIAGNOSIS — H18413 Arcus senilis, bilateral: Secondary | ICD-10-CM | POA: Diagnosis not present

## 2019-03-14 DIAGNOSIS — Z961 Presence of intraocular lens: Secondary | ICD-10-CM | POA: Diagnosis not present

## 2019-03-14 DIAGNOSIS — H353131 Nonexudative age-related macular degeneration, bilateral, early dry stage: Secondary | ICD-10-CM | POA: Diagnosis not present

## 2019-03-14 DIAGNOSIS — H43822 Vitreomacular adhesion, left eye: Secondary | ICD-10-CM | POA: Diagnosis not present

## 2019-03-14 DIAGNOSIS — H35372 Puckering of macula, left eye: Secondary | ICD-10-CM | POA: Diagnosis not present

## 2019-03-16 DIAGNOSIS — E1140 Type 2 diabetes mellitus with diabetic neuropathy, unspecified: Secondary | ICD-10-CM | POA: Diagnosis not present

## 2019-03-16 DIAGNOSIS — J449 Chronic obstructive pulmonary disease, unspecified: Secondary | ICD-10-CM | POA: Diagnosis not present

## 2019-03-16 DIAGNOSIS — J441 Chronic obstructive pulmonary disease with (acute) exacerbation: Secondary | ICD-10-CM | POA: Diagnosis not present

## 2019-03-16 DIAGNOSIS — E119 Type 2 diabetes mellitus without complications: Secondary | ICD-10-CM | POA: Diagnosis not present

## 2019-03-16 DIAGNOSIS — M189 Osteoarthritis of first carpometacarpal joint, unspecified: Secondary | ICD-10-CM | POA: Diagnosis not present

## 2019-03-16 DIAGNOSIS — J439 Emphysema, unspecified: Secondary | ICD-10-CM | POA: Diagnosis not present

## 2019-03-16 DIAGNOSIS — M19039 Primary osteoarthritis, unspecified wrist: Secondary | ICD-10-CM | POA: Diagnosis not present

## 2019-03-16 DIAGNOSIS — I10 Essential (primary) hypertension: Secondary | ICD-10-CM | POA: Diagnosis not present

## 2019-03-28 DIAGNOSIS — J439 Emphysema, unspecified: Secondary | ICD-10-CM | POA: Diagnosis not present

## 2019-04-24 ENCOUNTER — Other Ambulatory Visit: Payer: Self-pay

## 2019-04-24 ENCOUNTER — Encounter: Payer: Self-pay | Admitting: Pulmonary Disease

## 2019-04-24 ENCOUNTER — Ambulatory Visit: Payer: PPO | Admitting: Pulmonary Disease

## 2019-04-24 DIAGNOSIS — J849 Interstitial pulmonary disease, unspecified: Secondary | ICD-10-CM | POA: Diagnosis not present

## 2019-04-24 NOTE — Progress Notes (Signed)
Cheryl Brown    WJ:[id number]    January 22, 1936  Primary Care Physician:Gates, [name], MD  Referring Physician: [name], MD [address] [place],  [name]  Chief complaint: Follow up for emphysema, interstitial lung disease.  HPI: 83 year old with history of hypertension, diabetes, COPD, OSA (noncompliant with CPAP].   She was diagnosed with COPD in 2012.  Has been maintained on Advair.  Reports worsening dyspnea on exertion for the past few months- year.  Has dyspnea with rest, chronic cough with white mucus, wheezing, occasional fevers and chills at night.  She does not have seasonal allergies. + for acid reflux and indigestion.   Diagnosed with sleep apnea.  She has been intolerant of CPAP and is not interested in trying again. History noted for elevated ANA with positive ro antibody.  She was referred to Cheryl Brown in August 2018 but was told she did not have any rheumatologic issue.  She continues to have dry mouth, dry eyes.  Denies any joint pain, rash.  Office note from Cheryl Brown, rheumatology dated 05/20/17 Evaluated for positive ANA, SSA. Thought to be false-positive in spite of having xerostomia.  No systemic immunosuppression reccomended as there are no extraglandular manifestation.  04/05/2018- Scheduled for a bunion repair on rt foot on 8/27 but was canceled as anesthetist noted dyspnea, hypoxia  Pets: No pets Occupation: Retired from [employer] in 1979 Exposures: No known exposures, no mold, hot tub. ILD questionnaire-Negative for exposures. Smoking history: 30-pack-year smoking history.  Quit in 1988 Travel History: Lived in [place] all her life.  No significant travel history.  Interim history: Continues on Trelegy inhaler and supplemental oxygen. States that breathing is stable.  She wants to know if she can get off oxygen.  Since last visit she underwent bunion foot surgery without any problems.  Outpatient Encounter  Medications as of 04/24/2019  Medication Sig  . albuterol (PROAIR HFA) 108 (90 Base) MCG/ACT inhaler Inhale 2 puffs into the lungs every 6 (six) hours as needed.   [name] amitriptyline (ELAVIL) 50 MG tablet Take 50 mg by mouth at bedtime.  . BD PEN NEEDLE NANO U/F 32G X 4 MM MISC use as directed once daily  . Fluticasone-Umeclidin-Vilant (TRELEGY ELLIPTA) 100-62.5-25 MCG/INH AEPB Inhale 2 puffs into the lungs daily.  . furosemide (LASIX) 20 MG tablet Take 1 tablet (20 mg total) by mouth daily. (Patient taking differently: Take 20 mg by mouth daily as needed for fluid. )  . glucose blood (ONE TOUCH ULTRA TEST) test strip 1 each by Other route daily. Use to check blood sugar daily Dx: E11.41  . JARDIANCE 25 MG TABS tablet 1/2 TO 1 TABLET ONCE DAILY, TAKEN IN THE MORNING, WITH OR WITHOUT FOOD BY MOUTH  . losartan (COZAAR) 50 MG tablet Take 50 mg by mouth daily.  [name] DELICA LANCETS MISC Check blood sugar as directed  . polyethylene glycol powder (MIRALAX) powder Take 34 g by mouth daily. 2 capfuls daily per Urologist   . pramipexole (MIRAPEX) 0.25 MG tablet Take 1 tablet (0.25 mg total) by mouth at bedtime.  [name] PREMARIN vaginal cream Place 1 application vaginally every other day.  Nelva Nay SOLOSTAR 300 UNIT/ML SOPN INJECT 40 UNITS INTO THE SKIN EVERY MORNING  . [DISCONTINUED] Insulin Glargine (TOUJEO SOLOSTAR [place]) Inject 40 Units into the skin daily. Takes at night   No facility-administered encounter medications on file as of 04/24/2019.    Physical Exam: Blood pressure  132/78, pulse 72, height 5' 2.5" (1.588 m), weight 203 lb 12.8 oz (92.4 kg), SpO2 90 %. Gen:      No acute distress HEENT:  EOMI, sclera anicteric Neck:     No masses; no thyromegaly Lungs:    Bibasilar crackles. CV:         Regular rate and rhythm; no murmurs Abd:      + bowel sounds; soft, non-tender; no palpable masses, no distension Ext:    No edema; adequate peripheral perfusion.  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 Chest x-ray 09/10/17- stable coarse interstitial markings. High-resolution CT chest 09/15/17- bronchial wall thickening with mild emphysema.  Diffuse groundglass attenuation, septal thickening, bronchiectasis with moderate air trapping.  High-resolution CT 04/14/18-mild emphysema.  Stable findings of groundglass opacities, septal thickening, bronchiectasis with air-trapping.  Indeterminate for UIP.  Left main and three-vessel coronary artery disease. I have reviewed the images personally.  PFTs  09/21/17 FVC 1.96 [79%], FEV1 1.64 [90%], F/F 84, TLC 73%, DLCO 12.43 [55%]  Mild restriction with severe diffusion defect that corrects for alveolar volume.  04/22/2018 FVC 2.0 [82%], FEV1 1.73 [96%], F/F 87, TLC 74%, DLCO 12.04 [54%] Mild restriction with severe diffusion defect that corrects for alveolar volume.  01/23/2019 FVC 1.95 [80%], FEV1 1.56 [86%],/F 80, DLCO 10.92 [61%] Moderate-severe reduction in diffusion capacity   FENO 09/10/17- 9  6-minute walk test 11/02/17-  Starting heart rate, sats-69, 93% Starting heart rate, sats-108, 94% Distance walked 408,  Patient stopped with 30 seconds left due to SOB and leg cramps.  Labs: 04/05/17-positive ANA, Ro antibody 1.4  Repeat labs 09/21/17 Blood allergy profile-negative, IgE 4 ILD panel-ANA negative, angiotensin-converting enzyme-86 Rheumatoid factor, CCP-negative Ro antibody 1.1 La negative Hypersensitivity panel-negative  Cardiac Echocardiogram 09/24/17- Normal LV size with mild LV hypertrophy. EF 55-60%. Normal RV size and systolic function. No significant valvular abnormalities. PA peak pressure 31  Cardiac stress test 09/24/2016- low risk study, EF 55-65%.   Assessment:  Emphysema PFTs shows mild emphysematous changes.  There is no evidence of obstruction on PFTs.  Continue is on trelegy inhaler Continue supplemental oxygen.   Interstitial lung disease CT scan reviewed which shows nonspecific interstitial lung disease with air trapping.  Looks like chronic HP but does not have any exposure suggestive of hypersensitivity pneumonitis.  She has been evaluated by Cheryl Brown in the past for positive ANA, Ro antibody and was told that there is no evidence of rheumatologic issues. Repeat ILD panel noted for mild elevation in ACE level and persistent positive Ro antibody.    Discussed further work-up and possibly a bronchoscopy with biopsy but she would like to hold off for now We will continue to monitor.  I will get a follow-up CT scan to monitor If there is progression then we can readdress bronchoscopy.  GERD She has ongoing GERD and may have reflux contributing to ILD.   Continue prilosec  Plan/Recommendations: - Continue trelegy, supplemental oxygen - High Resolution CT scan  Marshell Garfinkel MD [facility] 04/24/2019, 2:06 PM

## 2019-04-24 NOTE — Patient Instructions (Addendum)
We will schedule you for high-resolution CT for monitoring of interstitial lung disease Follow-up in 3 months.

## 2019-04-28 DIAGNOSIS — J439 Emphysema, unspecified: Secondary | ICD-10-CM | POA: Diagnosis not present

## 2019-05-08 DIAGNOSIS — M189 Osteoarthritis of first carpometacarpal joint, unspecified: Secondary | ICD-10-CM | POA: Diagnosis not present

## 2019-05-08 DIAGNOSIS — E119 Type 2 diabetes mellitus without complications: Secondary | ICD-10-CM | POA: Diagnosis not present

## 2019-05-08 DIAGNOSIS — J449 Chronic obstructive pulmonary disease, unspecified: Secondary | ICD-10-CM | POA: Diagnosis not present

## 2019-05-08 DIAGNOSIS — J441 Chronic obstructive pulmonary disease with (acute) exacerbation: Secondary | ICD-10-CM | POA: Diagnosis not present

## 2019-05-08 DIAGNOSIS — M19039 Primary osteoarthritis, unspecified wrist: Secondary | ICD-10-CM | POA: Diagnosis not present

## 2019-05-08 DIAGNOSIS — I10 Essential (primary) hypertension: Secondary | ICD-10-CM | POA: Diagnosis not present

## 2019-05-08 DIAGNOSIS — E1140 Type 2 diabetes mellitus with diabetic neuropathy, unspecified: Secondary | ICD-10-CM | POA: Diagnosis not present

## 2019-05-08 DIAGNOSIS — J439 Emphysema, unspecified: Secondary | ICD-10-CM | POA: Diagnosis not present

## 2019-05-17 DIAGNOSIS — Z23 Encounter for immunization: Secondary | ICD-10-CM | POA: Diagnosis not present

## 2019-05-19 ENCOUNTER — Other Ambulatory Visit: Payer: Self-pay

## 2019-05-19 ENCOUNTER — Ambulatory Visit (INDEPENDENT_AMBULATORY_CARE_PROVIDER_SITE_OTHER)
Admission: RE | Admit: 2019-05-19 | Discharge: 2019-05-19 | Disposition: A | Discharge status: Home / Self Care | Payer: PPO | Source: Ambulatory Visit | Attending: Pulmonary Disease | Admitting: Pulmonary Disease

## 2019-05-19 DIAGNOSIS — J849 Interstitial pulmonary disease, unspecified: Secondary | ICD-10-CM | POA: Diagnosis not present

## 2019-05-25 ENCOUNTER — Telehealth: Payer: Self-pay | Admitting: Pulmonary Disease

## 2019-05-25 NOTE — Telephone Encounter (Signed)
Let patient know that CT shows slight worsening of the interstitial lung disease I would like to do a procedure called bronchoscope for further evaluation.  Please set up clinic visit to discuss next steps in detail.  Can be a tele-visit

## 2019-05-25 NOTE — Telephone Encounter (Signed)
Spoke with patient.  She would rather come into office because she is unsure about her phone Results discuss and recommendations shared.  Patient scheduled with Dr. Vaughan Browner on 05/31/19. 30 min visit (ILD) Nothing further needed

## 2019-05-25 NOTE — Telephone Encounter (Signed)
Spoke with patient.  Looks like CT was done but not resulted Dr. Vaughan Browner please advise,  Patient is requesting results be left on her answering machine if she does not answer.

## 2019-05-31 ENCOUNTER — Ambulatory Visit (INDEPENDENT_AMBULATORY_CARE_PROVIDER_SITE_OTHER): Payer: PPO | Admitting: Pulmonary Disease

## 2019-05-31 ENCOUNTER — Telehealth: Payer: Self-pay | Admitting: Pulmonary Disease

## 2019-05-31 ENCOUNTER — Encounter: Payer: Self-pay | Admitting: Pulmonary Disease

## 2019-05-31 ENCOUNTER — Other Ambulatory Visit: Payer: Self-pay

## 2019-05-31 VITALS — BP 118/68 | HR 78 | Temp 97.1°F | Ht 62.0 in | Wt 202.2 lb

## 2019-05-31 DIAGNOSIS — J849 Interstitial pulmonary disease, unspecified: Secondary | ICD-10-CM | POA: Diagnosis not present

## 2019-05-31 DIAGNOSIS — Z01812 Encounter for preprocedural laboratory examination: Secondary | ICD-10-CM | POA: Diagnosis not present

## 2019-05-31 LAB — CBC WITH DIFFERENTIAL/PLATELET
Basophils Absolute: 0.1 10*3/uL (ref 0.0–0.1)
Basophils Relative: 1 % (ref 0.0–3.0)
Eosinophils Absolute: 0.2 10*3/uL (ref 0.0–0.7)
Eosinophils Relative: 3.4 % (ref 0.0–5.0)
HCT: 42.9 % (ref 36.0–46.0)
Hemoglobin: 14.2 g/dL (ref 12.0–15.0)
Lymphocytes Relative: 17.2 % (ref 12.0–46.0)
Lymphs Abs: 1 10*3/uL (ref 0.7–4.0)
MCHC: 33 g/dL (ref 30.0–36.0)
MCV: 93.6 fl (ref 78.0–100.0)
Monocytes Absolute: 0.4 10*3/uL (ref 0.1–1.0)
Monocytes Relative: 6.4 % (ref 3.0–12.0)
Neutro Abs: 4.1 10*3/uL (ref 1.4–7.7)
Neutrophils Relative %: 72 % (ref 43.0–77.0)
Platelets: 187 10*3/uL (ref 150.0–400.0)
RBC: 4.58 Mil/uL (ref 3.87–5.11)
RDW: 13.4 % (ref 11.5–15.5)
WBC: 5.6 10*3/uL (ref 4.0–10.5)

## 2019-05-31 LAB — PROTIME-INR
INR: 1 ratio (ref 0.8–1.0)
Prothrombin Time: 11.5 s (ref 9.6–13.1)

## 2019-05-31 NOTE — Addendum Note (Signed)
Addended by: Suzzanne Cloud E on: 05/31/2019 11:29 AM   Modules accepted: Orders

## 2019-05-31 NOTE — Telephone Encounter (Signed)
LMTCB.   Will leave in triage until we are able to speak with patient.

## 2019-05-31 NOTE — Progress Notes (Signed)
Cheryl Brown    [id number]    05-30-1936  Primary Care Physician:Gates, [name], MD  Referring Physician: [name], MD [address] [place],  [place] [address]  Chief complaint: Follow up for emphysema, interstitial lung disease.  HPI: 83 year old with history of hypertension, diabetes, COPD, OSA (noncompliant with CPAP].   She was diagnosed with COPD in 2012.  Has dyspnea with rest, chronic cough with white mucus, wheezing, occasional fevers and chills at night.  She does not have seasonal allergies. + for acid reflux and indigestion.   Diagnosed with sleep apnea.  She has been intolerant of CPAP and is not interested in trying again. History noted for elevated ANA with positive ro antibody.  She was referred to Dr. Amil Brown in August 2018 but was told she did not have any rheumatologic issue.  She continues to have dry mouth, dry eyes.  Denies any joint pain, rash.  Office note from Dr. Amil Brown, rheumatology dated 05/20/17 Evaluated for positive ANA, SSA. Thought to be false-positive in spite of having xerostomia.  No systemic immunosuppression reccomended as there are no extraglandular manifestation.  Pets: No pets Occupation: Retired from [employer] in 1979 Exposures: No known exposures, no mold, hot tub. ILD questionnaire-Negative for exposures. Smoking history: 30-pack-year smoking history.  Quit in 1988 Travel History: Lived in [place] all her life.  No significant travel history.  Interim history: Continues on Trelegy inhaler and supplemental oxygen. Continues to have chronic dyspnea on exertion.  She is here for review of CT scan.  Outpatient Encounter Medications as of 05/31/2019  Medication Sig  . albuterol (PROAIR HFA) 108 (90 Base) MCG/ACT inhaler Inhale 2 puffs into the lungs every 6 (six) hours as needed.   [name] amitriptyline (ELAVIL) 50 MG tablet Take 50 mg by mouth at bedtime.  . BD PEN NEEDLE NANO U/F 32G X 4 MM MISC use as directed  once daily  . Fluticasone-Umeclidin-Vilant (TRELEGY ELLIPTA) 100-62.5-25 MCG/INH AEPB Inhale 2 puffs into the lungs daily.  . furosemide (LASIX) 20 MG tablet Take 1 tablet (20 mg total) by mouth daily. (Patient taking differently: Take 20 mg by mouth daily as needed for fluid. )  . glucose blood (ONE TOUCH ULTRA TEST) test strip 1 each by Other route daily. Use to check blood sugar daily Dx: E11.41  . JARDIANCE 25 MG TABS tablet 1/2 TO 1 TABLET ONCE DAILY, TAKEN IN THE MORNING, WITH OR WITHOUT FOOD BY MOUTH  . losartan (COZAAR) 50 MG tablet Take 50 mg by mouth daily.  Cheryl Brown DELICA LANCETS MISC Check blood sugar as directed  . polyethylene glycol powder (MIRALAX) powder Take 34 g by mouth daily. 2 capfuls daily per Urologist   . pramipexole (MIRAPEX) 0.25 MG tablet Take 1 tablet (0.25 mg total) by mouth at bedtime.  [name] PREMARIN vaginal cream Place 1 application vaginally every other day.  Cheryl Brown SOLOSTAR 300 UNIT/ML SOPN INJECT 40 UNITS INTO THE SKIN EVERY MORNING   No facility-administered encounter medications on file as of 05/31/2019.    Physical Exam: Blood pressure 118/68, pulse 78, temperature (!) 97.1 F (36.2 C), temperature source Temporal, height _0  (1.575 m), weight 202 lb 3.2 oz (91.7 kg), SpO2 95 %. Gen:      No acute distress HEENT:  EOMI, sclera anicteric Neck:     No masses; no thyromegaly Lungs:    Clear to auscultation bilaterally; normal respiratory effort CV:  Regular rate and rhythm; no murmurs Abd:      + bowel sounds; soft, non-tender; no palpable masses, no distension Ext:    No edema; adequate peripheral perfusion Skin:      Warm and dry; no rash Neuro: alert and oriented x 3 Psych: normal mood and affect  Data Reviewed: Imaging CT abdomen pelvis 08/28/16-bibasilar opacities, atelectasis Chest x-ray 08/28/16-coarse interstitial marking, nodular opacity in the left midlung region. Chest x-ray 09/10/17- stable coarse interstitial markings.  High-resolution CT chest 09/15/17- bronchial wall thickening with mild emphysema.  Diffuse groundglass attenuation, septal thickening, bronchiectasis with moderate air trapping.  High-resolution CT 04/14/18-mild emphysema.  Stable findings of groundglass opacities, septal thickening, bronchiectasis with air-trapping.  Indeterminate for UIP.  Left main and three-vessel coronary artery disease. High-resolution CT 05/19/2019-stable findings of interstitial lung disease as before.  Possible chronic HP.  With slight progression compared to 2019 I have reviewed the images personally.  PFTs  09/21/17 FVC 1.96 [79%], FEV1 1.64 [90%], F/F 84, TLC 73%, DLCO 12.43 [55%]  Mild restriction with severe diffusion defect that corrects for alveolar volume.  04/22/2018 FVC 2.0 [82%], FEV1 1.73 [96%], F/F 87, TLC 74%, DLCO 12.04 [54%] Mild restriction with severe diffusion defect that corrects for alveolar volume.  01/23/2019 FVC 1.95 [80%], FEV1 1.56 [86%],/F 80, DLCO 10.92 [61%] Moderate-severe reduction in diffusion capacity  FENO 09/10/17- 9  6-minute walk test 11/02/17-  Starting heart rate, sats-69, 93% Starting heart rate, sats-108, 94% Distance walked 408,  Patient stopped with 30 seconds left due to SOB and leg cramps.  Labs: 04/05/17-positive ANA, Ro antibody 1.4  Repeat labs 09/21/17 Blood allergy profile-negative, IgE 4 ILD panel-ANA negative, angiotensin-converting enzyme-86 Rheumatoid factor, CCP-negative Ro antibody 1.1 La negative Hypersensitivity panel-negative  Cardiac Echocardiogram 09/24/17- Normal LV size with mild LV hypertrophy. EF 55-60%. Normal RV size and systolic function. No significant valvular abnormalities. PA peak pressure 31  Cardiac stress test 09/24/2016- low risk study, EF 55-65%.  Assessment:  Emphysema PFTs shows mild emphysematous changes.  There is no evidence of obstruction on PFTs.  Continue is on trelegy inhaler Continue supplemental oxygen.   Interstitial  lung disease CT scan reviewed which shows nonspecific interstitial lung disease with air trapping.  Looks like chronic HP but does not have any exposure suggestive of hypersensitivity pneumonitis.  She has been evaluated by Dr. Amil Brown in the past for positive ANA, Ro antibody and was told that there is no evidence of rheumatologic issues.   We had previously recommended a bronchoscopy for BAL and possible transbronchial biopsies but patient wanted to hold off.  Given CT scan findings showing slight progression I have discussed this again and she is agreeable to proceed Risk benefits of procedure reviewed with patient.  GERD She has ongoing GERD and may have reflux contributing to ILD.   Continue prilosec  Plan/Recommendations: - Continue trelegy, supplemental oxygen - Schedule bronchoscope  Marshell Garfinkel MD [facility] 05/31/2019, 11:08 AM

## 2019-05-31 NOTE — Patient Instructions (Addendum)
We will check a CBC, PT/INR today in preparation for bronchoscope Will repeat a limited CTD profile including ANA, rheumatoid factor, CCP, SSA, SSB and angiotensin-converting enzyme We will schedule you for the procedure and give you a call after we have a date and time  Follow-up in 1 month.

## 2019-06-01 NOTE — Telephone Encounter (Signed)
Pt is scheduled for a bronch on 06/13/2019. She will need COVID testing before this procedure. Will route to [facility] to arrange.

## 2019-06-01 NOTE — Telephone Encounter (Signed)
Covid test scheduled & pt made aware.  Nothing further needed.

## 2019-06-02 LAB — CYCLIC CITRUL PEPTIDE ANTIBODY, IGG: Cyclic Citrullin Peptide Ab: <16 UNITS

## 2019-06-02 LAB — ANTI-NUCLEAR AB-TITER (ANA TITER): ANA Titer 1: 1:80 {titer} — ABNORMAL HIGH

## 2019-06-02 LAB — SJOGREN'S SYNDROME ANTIBODS(SSA + SSB)
SSA (Ro) (ENA) Antibody, IgG: 1.3 AI — AB
SSB (La) (ENA) Antibody, IgG: <1 AI

## 2019-06-02 LAB — RHEUMATOID FACTOR: Rheumatoid fact SerPl-aCnc: <14 IU/mL (ref <14)

## 2019-06-02 LAB — ANA: Anti Nuclear Antibody (ANA): POSITIVE — AB

## 2019-06-02 LAB — ANGIOTENSIN CONVERTING ENZYME: Angiotensin-Converting Enzyme: 80 U/L — ABNORMAL HIGH (ref 9–67)

## 2019-06-10 ENCOUNTER — Other Ambulatory Visit (HOSPITAL_COMMUNITY)
Admission: RE | Admit: 2019-06-10 | Discharge: 2019-06-10 | Disposition: A | Discharge status: Home / Self Care | Payer: PPO | Source: Ambulatory Visit | Attending: Pulmonary Disease | Admitting: Pulmonary Disease

## 2019-06-10 DIAGNOSIS — Z01812 Encounter for preprocedural laboratory examination: Secondary | ICD-10-CM | POA: Insufficient documentation

## 2019-06-10 DIAGNOSIS — Z20828 Contact with and (suspected) exposure to other viral communicable diseases: Secondary | ICD-10-CM | POA: Insufficient documentation

## 2019-06-11 LAB — NOVEL CORONAVIRUS, NAA (HOSP ORDER, SEND-OUT TO REF LAB; TAT 18-24 HRS): SARS-CoV-2, NAA: NOT DETECTED

## 2019-06-13 ENCOUNTER — Ambulatory Visit (HOSPITAL_COMMUNITY): Payer: PPO

## 2019-06-13 ENCOUNTER — Ambulatory Visit (HOSPITAL_COMMUNITY)
Admission: RE | Admit: 2019-06-13 | Discharge: 2019-06-13 | Disposition: A | Discharge status: Home / Self Care | Payer: PPO | Source: Ambulatory Visit | Attending: Pulmonary Disease | Admitting: Pulmonary Disease

## 2019-06-13 ENCOUNTER — Encounter (HOSPITAL_COMMUNITY)
Admission: RE | Disposition: A | Discharge status: Home / Self Care | Payer: Self-pay | Source: Home / Self Care | Attending: Pulmonary Disease

## 2019-06-13 ENCOUNTER — Ambulatory Visit (HOSPITAL_COMMUNITY)
Admission: RE | Admit: 2019-06-13 | Discharge: 2019-06-13 | Disposition: A | Discharge status: Home / Self Care | Payer: PPO | Attending: Pulmonary Disease | Admitting: Pulmonary Disease

## 2019-06-13 DIAGNOSIS — Z9119 Patient's noncompliance with other medical treatment and regimen: Secondary | ICD-10-CM | POA: Diagnosis not present

## 2019-06-13 DIAGNOSIS — J984 Other disorders of lung: Secondary | ICD-10-CM | POA: Diagnosis not present

## 2019-06-13 DIAGNOSIS — Z794 Long term (current) use of insulin: Secondary | ICD-10-CM | POA: Insufficient documentation

## 2019-06-13 DIAGNOSIS — J449 Chronic obstructive pulmonary disease, unspecified: Secondary | ICD-10-CM | POA: Diagnosis not present

## 2019-06-13 DIAGNOSIS — K219 Gastro-esophageal reflux disease without esophagitis: Secondary | ICD-10-CM | POA: Insufficient documentation

## 2019-06-13 DIAGNOSIS — Z7951 Long term (current) use of inhaled steroids: Secondary | ICD-10-CM | POA: Diagnosis not present

## 2019-06-13 DIAGNOSIS — J849 Interstitial pulmonary disease, unspecified: Secondary | ICD-10-CM | POA: Diagnosis not present

## 2019-06-13 DIAGNOSIS — I10 Essential (primary) hypertension: Secondary | ICD-10-CM | POA: Diagnosis not present

## 2019-06-13 DIAGNOSIS — E119 Type 2 diabetes mellitus without complications: Secondary | ICD-10-CM | POA: Diagnosis not present

## 2019-06-13 DIAGNOSIS — Z79899 Other long term (current) drug therapy: Secondary | ICD-10-CM | POA: Insufficient documentation

## 2019-06-13 DIAGNOSIS — Z9889 Other specified postprocedural states: Secondary | ICD-10-CM

## 2019-06-13 DIAGNOSIS — G4733 Obstructive sleep apnea (adult) (pediatric): Secondary | ICD-10-CM | POA: Diagnosis not present

## 2019-06-13 DIAGNOSIS — R918 Other nonspecific abnormal finding of lung field: Secondary | ICD-10-CM | POA: Diagnosis not present

## 2019-06-13 HISTORY — PX: VIDEO BRONCHOSCOPY: SHX5072

## 2019-06-13 LAB — BODY FLUID CELL COUNT WITH DIFFERENTIAL
Eos, Fluid: 18 %
Lymphs, Fluid: 30 %
Monocyte-Macrophage-Serous Fluid: 30 % — ABNORMAL LOW (ref 50–90)
Neutrophil Count, Fluid: 22 % (ref 0–25)
Total Nucleated Cell Count, Fluid: 213 cu mm (ref 0–1000)

## 2019-06-13 LAB — GLUCOSE, CAPILLARY: Glucose-Capillary: 125 mg/dL — ABNORMAL HIGH (ref 70–99)

## 2019-06-13 SURGERY — BRONCHOSCOPY, WITH FLUOROSCOPY
Anesthesia: Moderate Sedation | Laterality: Bilateral

## 2019-06-13 MED ORDER — MIDAZOLAM HCL (PF) 5 MG/ML IJ SOLN
INTRAMUSCULAR | Status: AC
  Filled 2019-06-13: qty 2

## 2019-06-13 MED ORDER — BUTAMBEN-TETRACAINE-BENZOCAINE 2-2-14 % EX AERO: 1.0000 | INHALATION_SPRAY | Freq: Once | CUTANEOUS | Status: DC

## 2019-06-13 MED ORDER — FENTANYL CITRATE (PF) 100 MCG/2ML IJ SOLN
INTRAMUSCULAR | Status: AC
  Filled 2019-06-13: qty 4

## 2019-06-13 MED ORDER — SODIUM CHLORIDE 0.9 % IV SOLN
INTRAVENOUS | Status: DC
  Administered 2019-06-13: 09:00:00 via INTRAVENOUS

## 2019-06-13 MED ORDER — LIDOCAINE HCL URETHRAL/MUCOSAL 2 % EX GEL: 1.0000 "application " | Freq: Once | CUTANEOUS | Status: DC

## 2019-06-13 MED ORDER — MIDAZOLAM HCL (PF) 10 MG/2ML IJ SOLN
INTRAMUSCULAR | Status: DC | PRN
  Administered 2019-06-13 (×3): 1 mg via INTRAVENOUS

## 2019-06-13 MED ORDER — LIDOCAINE HCL 1 % IJ SOLN
INTRAMUSCULAR | Status: DC | PRN
  Administered 2019-06-13: 6 mL via RESPIRATORY_TRACT

## 2019-06-13 MED ORDER — PHENYLEPHRINE HCL 0.25 % NA SOLN
1.0000 | Freq: Four times a day (QID) | NASAL | Status: DC | PRN
  Filled 2019-06-13: qty 15

## 2019-06-13 MED ORDER — FENTANYL CITRATE (PF) 100 MCG/2ML IJ SOLN
INTRAMUSCULAR | Status: DC | PRN
  Administered 2019-06-13 (×3): 25 ug via INTRAVENOUS

## 2019-06-13 NOTE — Discharge Instructions (Signed)
Flexible Bronchoscopy, Care After This sheet gives you information about how to care for yourself after your test. Your doctor may also give you more specific instructions. If you have problems or questions, contact your doctor. Follow these instructions at home: Eating and drinking  Do not eat or drink anything (not even water) for 2 hours after your test, or until your numbing medicine (local anesthetic) wears off.  When your numbness is gone and your cough and gag reflexes have come back, you may: ? Eat only soft foods. ? Slowly drink liquids.  The day after the test, go back to your normal diet. Driving  Do not drive for 24 hours if you were given a medicine to help you relax (sedative).  Do not drive or use heavy machinery while taking prescription pain medicine. General instructions   Take over-the-counter and prescription medicines only as told by your doctor.  Return to your normal activities as told. Ask what activities are safe for you.  Do not use any products that have nicotine or tobacco in them. This includes cigarettes and e-cigarettes. If you need help quitting, ask your doctor.  Keep all follow-up visits as told by your doctor. This is important. It is very important if you had a tissue sample (biopsy) taken. Get help right away if:  You have shortness of breath that gets worse.  You get light-headed.  You feel like you are going to pass out (faint).  You have chest pain.  You cough up: ? More than a little blood. ? More blood than before. Summary  Do not eat or drink anything (not even water) for 2 hours after your test, or until your numbing medicine wears off.  Do not use cigarettes. Do not use e-cigarettes.  Get help right away if you have chest pain. This information is not intended to replace advice given to you by your health care provider. Make sure you discuss any questions you have with your health care provider. Document Released: 05/24/2009  Document Revised: 07/09/2017 Document Reviewed: 08/14/2016 Elsevier Patient Education  2020 [employer]. Nothing to eat or drink until   10:30   am today    06/13/2019 Any questions or concerns please call the office at [phone]

## 2019-06-13 NOTE — Progress Notes (Signed)
Video Bronchoscope done 'Intervention Bronchial washing done Intervention Bronchial biopsy done Procedure tolerated well

## 2019-06-13 NOTE — H&P (Signed)
Cheryl Brown    [id number]    07-04-1936  Primary Care Physician:Gates, [name], MD  Referring Physician: No referring provider defined for this encounter.  Chief complaint: Follow up for emphysema, interstitial lung disease.  HPI: 83 year old with history of hypertension, diabetes, COPD, OSA (noncompliant with CPAP].   She was diagnosed with COPD in 2012.  Has dyspnea with rest, chronic cough with white mucus, wheezing, occasional fevers and chills at night.  She does not have seasonal allergies. + for acid reflux and indigestion.   Diagnosed with sleep apnea.  She has been intolerant of CPAP and is not interested in trying again. History noted for elevated ANA with positive ro antibody.  She was referred to Dr. Amil Amen in August 2018 but was told she did not have any rheumatologic issue.  She continues to have dry mouth, dry eyes.  Denies any joint pain, rash.  Office note from Dr. Amil Amen, rheumatology dated 05/20/17 Evaluated for positive ANA, SSA. Thought to be false-positive in spite of having xerostomia.  No systemic immunosuppression reccomended as there are no extraglandular manifestation.  Pets: No pets Occupation: Retired from [employer] in 1979 Exposures: No known exposures, no mold, hot tub. ILD questionnaire-Negative for exposures. Smoking history: 30-pack-year smoking history.  Quit in 1988 Travel History: Lived in [place] all her life.  No significant travel history.  Interim history: Here for bronchoscopy procedure. States that dyspnea is unchanged. No new complaints.  Outpatient Encounter Medications as of 05/31/2019  Medication Sig  . albuterol (PROAIR HFA) 108 (90 Base) MCG/ACT inhaler Inhale 2 puffs into the lungs every 6 (six) hours as needed.   [name] amitriptyline (ELAVIL) 50 MG tablet Take 50 mg by mouth at bedtime.  . BD PEN NEEDLE NANO U/F 32G X 4 MM MISC use as directed once daily  . Fluticasone-Umeclidin-Vilant (TRELEGY ELLIPTA) 100-62.5-25  MCG/INH AEPB Inhale 2 puffs into the lungs daily.  . furosemide (LASIX) 20 MG tablet Take 1 tablet (20 mg total) by mouth daily. (Patient taking differently: Take 20 mg by mouth daily as needed for fluid. )  . glucose blood (ONE TOUCH ULTRA TEST) test strip 1 each by Other route daily. Use to check blood sugar daily Dx: E11.41  . JARDIANCE 25 MG TABS tablet 1/2 TO 1 TABLET ONCE DAILY, TAKEN IN THE MORNING, WITH OR WITHOUT FOOD BY MOUTH  . losartan (COZAAR) 50 MG tablet Take 50 mg by mouth daily.  [name] DELICA LANCETS MISC Check blood sugar as directed  . polyethylene glycol powder (MIRALAX) powder Take 34 g by mouth daily. 2 capfuls daily per Urologist   . pramipexole (MIRAPEX) 0.25 MG tablet Take 1 tablet (0.25 mg total) by mouth at bedtime.  [name] PREMARIN vaginal cream Place 1 application vaginally every other day.  Nelva Nay SOLOSTAR 300 UNIT/ML SOPN INJECT 40 UNITS INTO THE SKIN EVERY MORNING   No facility-administered encounter medications on file as of 05/31/2019.    Physical Exam: Blood pressure 127/64, pulse 78, temperature 97.8 F (36.6 C), temperature source Oral, resp. rate (!) 27, SpO2 92 %. Gen:      No acute distress HEENT:  EOMI, sclera anicteric Neck:     No masses; no thyromegaly Lungs:    Clear to auscultation bilaterally; normal respiratory effort CV:         Regular rate and rhythm; no murmurs Abd:      + bowel sounds; soft, non-tender; no palpable masses, no distension Ext:  No edema; adequate peripheral perfusion Skin:      Warm and dry; no rash Neuro: alert and oriented x 3 Psych: normal mood and affect  Data Reviewed: Imaging CT abdomen pelvis 08/28/16-bibasilar opacities, atelectasis Chest x-ray 08/28/16-coarse interstitial marking, nodular opacity in the left midlung region. Chest x-ray 09/10/17- stable coarse interstitial markings. High-resolution CT chest 09/15/17- bronchial wall thickening with mild emphysema.  Diffuse groundglass attenuation, septal  thickening, bronchiectasis with moderate air trapping.  High-resolution CT 04/14/18-mild emphysema.  Stable findings of groundglass opacities, septal thickening, bronchiectasis with air-trapping.  Indeterminate for UIP.  Left main and three-vessel coronary artery disease. High-resolution CT 05/19/2019-stable findings of interstitial lung disease as before.  Possible chronic HP.  With slight progression compared to 2019 I have reviewed the images personally.  PFTs  09/21/17 FVC 1.96 [79%], FEV1 1.64 [90%], F/F 84, TLC 73%, DLCO 12.43 [55%]  Mild restriction with severe diffusion defect that corrects for alveolar volume.  04/22/2018 FVC 2.0 [82%], FEV1 1.73 [96%], F/F 87, TLC 74%, DLCO 12.04 [54%] Mild restriction with severe diffusion defect that corrects for alveolar volume.  01/23/2019 FVC 1.95 [80%], FEV1 1.56 [86%],/F 80, DLCO 10.92 [61%] Moderate-severe reduction in diffusion capacity  FENO 09/10/17- 9  6-minute walk test 11/02/17-  Starting heart rate, sats-69, 93% Starting heart rate, sats-108, 94% Distance walked 408,  Patient stopped with 30 seconds left due to SOB and leg cramps.  Labs: 04/05/17-positive ANA, Ro antibody 1.4  Repeat labs 09/21/17 Blood allergy profile-negative, IgE 4 ILD panel-ANA negative, angiotensin-converting enzyme-86 Rheumatoid factor, CCP-negative Ro antibody 1.1 La negative Hypersensitivity panel-negative  Cardiac Echocardiogram 09/24/17- Normal LV size with mild LV hypertrophy. EF 55-60%. Normal RV size and systolic function. No significant valvular abnormalities. PA peak pressure 31  Cardiac stress test 09/24/2016- low risk study, EF 55-65%.  Assessment:   Interstitial lung disease CT scan reviewed which shows nonspecific interstitial lung disease with air trapping.  Looks like chronic HP but does not have any exposure suggestive of hypersensitivity pneumonitis.  She has been evaluated by Dr. Amil Amen in the past for positive ANA, Ro antibody and was  told that there is no evidence of rheumatologic issues. Recommendation to perform broncoscopy with BAL and biopsy for diagnosis  Risk benefits of bronchoscope procedure reviewed with patient in detail and she has agreed to proceed.  Plan/Recommendations: - Bronchoscope  Marshell Garfinkel MD [facility] 06/13/2019, 8:51 AM

## 2019-06-13 NOTE — Op Note (Signed)
[facility] Cardiopulmonary Patient Name: Cheryl Brown Pocedure Date: 06/13/2019 MRN: [id number] Attending MD: Marshell Garfinkel , MD Date of Birth: 28-Oct-1935 CSN: Finalized Age: 83 Admit Type: Ambulatory Gender: Female Procedure:             Bronchoscopy Indications:           Interstitial lung disease Providers:             Marshell Garfinkel, MD, Kelby Fam RRT,RCP, Andre Lefort RRT,RCP Referring MD:           Medicines:             Midazolam 3 mg mg IV, Fentanyl 75 mcg IV Complications:         No immediate complications Estimated Blood Loss:  Estimated blood loss: none. Procedure:             Pre-Anesthesia Assessment:                        - A History and Physical has been performed. Patient                         meds and allergies have been reviewed. The risks and                         benefits of the procedure and the sedation options and                         risks were discussed with the patient. All questions                         were answered and informed consent was obtained.                         Patient identification and proposed procedure were                         verified prior to the procedure by the physician in                         the procedure room. Mental Status Examination: alert                         and oriented. Airway Examination: normal oropharyngeal                         airway. Respiratory Examination: clear to                         auscultation. CV Examination: RRR, no murmurs, no S3                         or S4. ASA Grade Assessment: II - A patient with mild                         systemic disease. After reviewing the risks and  benefits, the patient was deemed in satisfactory                         condition to undergo the procedure. The anesthesia                         plan was to use moderate sedation / analgesia                         (conscious  sedation). Immediately prior to                         administration of medications, the patient was                         re-assessed for adequacy to receive sedatives. The                         heart rate, respiratory rate, oxygen saturations,                         blood pressure, adequacy of pulmonary ventilation, and                         response to care were monitored throughout the                         procedure. The physical status of the patient was                         re-assessed after the procedure.                        After obtaining informed consent, the bronchoscope was                         passed under direct vision. Throughout the procedure,                         the patient's blood pressure, pulse, and oxygen                         saturations were monitored continuously. the BF-H190                         ([id number]) Olympus Diagnostic Bronchoscope was                         introduced through the mouth and advanced to the                         tracheobronchial tree of both lungs. The procedure was                         accomplished with ease. The procedure was accomplished                         with ease. Scope In: 8:23:10 AM Scope Out: 8:40:21 AM Findings:      The  oropharynx appears normal. The larynx appears normal. The vocal       cords appear normal. The subglottic space is normal. The trachea is of       normal caliber. The carina is sharp. The tracheobronchial tree was       examined to at least the first subsegmental level. Bronchial mucosa and       anatomy are normal; there are no endobronchial lesions, and no       secretions. The trachea and main bronchi are collapsible suggestive of       tracheobronchomalacia      Bronchoalveolar lavage was performed in the RUL anterior segment (B3) of       the lung and sent for cell count, bacterial culture, viral smears &       culture, and fungal & AFB analysis. 180 mL of fluid were  instilled. 120       mL were returned. The return was cloudy. There were no mucoid plugs in       the return fluid. Multiple specimens were obtained and pooled into one       specimen, which was sent for analysis.      Transbronchial biopsies of an area of infiltration were performed in the       posterior segment of the right upper lobe, in the anterior segment of       the right upper lobe, in the medial basal segment of the right lower       lobe and in the anterior basal segment of the right lower lobe using       alligator forceps and sent for histopathology examination. The procedure       was guided by fluoroscopy. Transbronchial biopsy technique was selected       because the sampling site was not accessible using standard endoscopic       (bronchoscopic) techniques. Nine biopsy passes were performed. Seven       biopsy samples were obtained. Impression:            - Interstitial lung disease                        - Tracheobronchomalacia                        - Bronchoalveolar lavage was performed.                        - Transbronchial lung biopsies were performed. Moderate Sedation:      Moderate (conscious) sedation was administered by the endoscopy nurse       and supervised by the endoscopist. The following parameters were       monitored: oxygen saturation, heart rate, blood pressure, respiratory       rate, EKG, adequacy of pulmonary ventilation, and response to care.       Total physician intraservice time was 25 minutes. Recommendation:        - Await BAL, biopsy and culture results. Procedure Code(s):     --- Professional ---                        [phone], Bronchoscopy, rigid or flexible, including                         fluoroscopic guidance, when performed; with  transbronchial lung biopsy(s), single lobe                        [id number], Bronchoscopy, rigid or flexible, including                         fluoroscopic guidance, when performed;  with bronchial                         alveolar lavage                        [phone], Bronchoscopy, rigid or flexible, including                         fluoroscopic guidance, when performed; with                         transbronchial lung biopsy(s), each additional lobe                         (List separately in addition to code for primary                         procedure)                        [id number], Moderate sedation services provided by the same                         physician or other qualified health care professional                         performing the diagnostic or therapeutic service that                         the sedation supports, requiring the presence of an                         independent trained observer to assist in the                         monitoring of the patient's level of consciousness and                         physiological status; initial 15 minutes of                         intraservice time, patient age 54 years or older                        [phone], Moderate sedation; each additional 15 minutes                         intraservice time Diagnosis Code(s):     --- Professional ---                        J84.9, Interstitial pulmonary disease, unspecified CPT copyright 2019 American Medical Association. All rights reserved. The codes documented in this report are preliminary and upon coder review  may  be revised to meet current compliance requirements. Marshell Garfinkel, MD 06/13/2019 9:05:33 AM Number of Addenda: 0

## 2019-06-14 ENCOUNTER — Encounter (HOSPITAL_COMMUNITY): Payer: Self-pay | Admitting: Pulmonary Disease

## 2019-06-14 LAB — ACID FAST SMEAR (AFB, MYCOBACTERIA): Acid Fast Smear: NEGATIVE

## 2019-06-14 LAB — SURGICAL PATHOLOGY

## 2019-06-15 DIAGNOSIS — M189 Osteoarthritis of first carpometacarpal joint, unspecified: Secondary | ICD-10-CM | POA: Diagnosis not present

## 2019-06-15 DIAGNOSIS — E559 Vitamin D deficiency, unspecified: Secondary | ICD-10-CM | POA: Diagnosis not present

## 2019-06-15 DIAGNOSIS — J439 Emphysema, unspecified: Secondary | ICD-10-CM | POA: Diagnosis not present

## 2019-06-15 DIAGNOSIS — B373 Candidiasis of vulva and vagina: Secondary | ICD-10-CM | POA: Diagnosis not present

## 2019-06-15 DIAGNOSIS — M19039 Primary osteoarthritis, unspecified wrist: Secondary | ICD-10-CM | POA: Diagnosis not present

## 2019-06-15 DIAGNOSIS — E1140 Type 2 diabetes mellitus with diabetic neuropathy, unspecified: Secondary | ICD-10-CM | POA: Diagnosis not present

## 2019-06-15 DIAGNOSIS — I10 Essential (primary) hypertension: Secondary | ICD-10-CM | POA: Diagnosis not present

## 2019-06-15 DIAGNOSIS — J449 Chronic obstructive pulmonary disease, unspecified: Secondary | ICD-10-CM | POA: Diagnosis not present

## 2019-06-15 LAB — CULTURE, BAL-QUANTITATIVE W GRAM STAIN: Culture: 20000 — AB

## 2019-06-16 ENCOUNTER — Telehealth: Payer: Self-pay | Admitting: Pulmonary Disease

## 2019-06-16 LAB — PNEUMOCYSTIS JIROVECI SMEAR BY DFA: Pneumocystis jiroveci Ag: NEGATIVE

## 2019-06-16 NOTE — Telephone Encounter (Signed)
Call returned to patient, confirmed DOB, requesting the results of her Biopsy.   PM please advise. Thanks.

## 2019-06-16 NOTE — Telephone Encounter (Signed)
Her prelim results show inflammation which could be from some kind of ongoing exposure.    We are going to discuss her biopsy at conference on Tuesday next week where we can have the final diagnosis. I will give her a call after that to discuss in detail.  Also make a follow-up visit with me next week in clinic.

## 2019-06-16 NOTE — Telephone Encounter (Signed)
Spoke with patient. She verbalized understanding. She wishes to keep her current appointment, which is scheduled for November 18th at 1130. Nothing further needed at time of call.

## 2019-06-20 ENCOUNTER — Ambulatory Visit (INDEPENDENT_AMBULATORY_CARE_PROVIDER_SITE_OTHER): Payer: PPO | Admitting: Pulmonary Disease

## 2019-06-20 DIAGNOSIS — M189 Osteoarthritis of first carpometacarpal joint, unspecified: Secondary | ICD-10-CM | POA: Diagnosis not present

## 2019-06-20 DIAGNOSIS — J439 Emphysema, unspecified: Secondary | ICD-10-CM | POA: Diagnosis not present

## 2019-06-20 DIAGNOSIS — E1140 Type 2 diabetes mellitus with diabetic neuropathy, unspecified: Secondary | ICD-10-CM | POA: Diagnosis not present

## 2019-06-20 DIAGNOSIS — E119 Type 2 diabetes mellitus without complications: Secondary | ICD-10-CM | POA: Diagnosis not present

## 2019-06-20 DIAGNOSIS — J679 Hypersensitivity pneumonitis due to unspecified organic dust: Secondary | ICD-10-CM

## 2019-06-20 DIAGNOSIS — I10 Essential (primary) hypertension: Secondary | ICD-10-CM | POA: Diagnosis not present

## 2019-06-20 DIAGNOSIS — J441 Chronic obstructive pulmonary disease with (acute) exacerbation: Secondary | ICD-10-CM | POA: Diagnosis not present

## 2019-06-20 DIAGNOSIS — M19039 Primary osteoarthritis, unspecified wrist: Secondary | ICD-10-CM | POA: Diagnosis not present

## 2019-06-20 DIAGNOSIS — J449 Chronic obstructive pulmonary disease, unspecified: Secondary | ICD-10-CM | POA: Diagnosis not present

## 2019-06-28 ENCOUNTER — Encounter: Payer: Self-pay | Admitting: Pulmonary Disease

## 2019-06-28 ENCOUNTER — Other Ambulatory Visit: Payer: Self-pay

## 2019-06-28 ENCOUNTER — Telehealth: Payer: Self-pay | Admitting: Pulmonary Disease

## 2019-06-28 ENCOUNTER — Ambulatory Visit: Payer: PPO | Admitting: Pulmonary Disease

## 2019-06-28 VITALS — BP 120/68 | HR 70 | Temp 97.1°F | Ht 62.5 in | Wt 203.2 lb

## 2019-06-28 DIAGNOSIS — J679 Hypersensitivity pneumonitis due to unspecified organic dust: Secondary | ICD-10-CM | POA: Diagnosis not present

## 2019-06-28 MED ORDER — SULFAMETHOXAZOLE-TRIMETHOPRIM 800-160 MG PO TABS: 1.0000 | ORAL_TABLET | ORAL | 2 refills | Status: DC

## 2019-06-28 MED ORDER — PREDNISONE 20 MG PO TABS: 40.0000 mg | ORAL_TABLET | Freq: Every day | ORAL | 2 refills | Status: DC

## 2019-06-28 NOTE — Telephone Encounter (Signed)
Spoke with Steffanie Dunn at the pharmacy. She stated that the allergy they have a file shows that the patient is allergic to Bactrim. They wanted to know if Dr. Vaughan Browner wanted to prescribe something else. Advised them that Dr. Vaughan Browner is not in the office today but would be working at the hospital tomorrow and I will send a message to him. Also advised them to hold the medication until they hear back from [place].   Dr. Vaughan Browner, please advise. Thanks!

## 2019-06-28 NOTE — Progress Notes (Signed)
Cheryl Brown    VM:[id number]    1936/07/27  Primary Care Physician:Gates, [name], MD  Referring Physician: [name], MD [address] [place],  [place] [address]  Chief complaint: Follow up for emphysema, interstitial lung disease.  HPI: 83 year old with history of hypertension, diabetes, COPD, OSA (noncompliant with CPAP].   She was diagnosed with COPD in 2012.  Has dyspnea with rest, chronic cough with white mucus, wheezing, occasional fevers and chills at night.  She does not have seasonal allergies. + for acid reflux and indigestion.   Diagnosed with sleep apnea.  She has been intolerant of CPAP and is not interested in trying again. History noted for elevated ANA with positive ro antibody.  She was referred to Dr. Amil Amen in August 2018 but was told she did not have any rheumatologic issue.  She continues to have dry mouth, dry eyes.  Denies any joint pain, rash.  Office note from Dr. Amil Amen, rheumatology dated 05/20/17 Evaluated for positive ANA, SSA. Thought to be false-positive in spite of having xerostomia.  No systemic immunosuppression reccomended as there are no extraglandular manifestation.  Pets: No pets Occupation: Retired from [employer] in 1979 Exposures: No known exposures, no mold, hot tub. ILD questionnaire-Negative for exposures. Smoking history: 30-pack-year smoking history.  Quit in 1988 Travel History: Lived in [place] all her life.  No significant travel history.  Interim history: Continues on Trelegy inhaler and supplemental oxygen. Continues to have chronic dyspnea on exertion. Review of CT scan and recent bronchoscopy results.   Outpatient Encounter Medications as of 06/28/2019  Medication Sig  . albuterol (PROAIR HFA) 108 (90 Base) MCG/ACT inhaler Inhale 2 puffs into the lungs every 6 (six) hours as needed.   [name] amitriptyline (ELAVIL) 50 MG tablet Take 50 mg by mouth at bedtime.  . BD PEN NEEDLE NANO U/F 32G X 4 MM MISC use  as directed once daily  . Fluticasone-Umeclidin-Vilant (TRELEGY ELLIPTA) 100-62.5-25 MCG/INH AEPB Inhale 2 puffs into the lungs daily at 12 noon.   . furosemide (LASIX) 20 MG tablet Take 1 tablet (20 mg total) by mouth daily.  [name] glimepiride (AMARYL) 2 MG tablet Take 2 mg by mouth daily.  [name] glucose blood (ONE TOUCH ULTRA TEST) test strip 1 each by Other route daily. Use to check blood sugar daily Dx: E11.41  . losartan (COZAAR) 50 MG tablet Take 50 mg by mouth daily.  [name] omeprazole (PRILOSEC) 20 MG capsule Take 20 mg by mouth daily.  [name] DELICA LANCETS MISC Check blood sugar as directed  . polyethylene glycol powder (MIRALAX) powder Take 34 g by mouth daily. 2 capfuls daily per Urologist   . potassium chloride SA (KLOR-CON) 20 MEQ tablet Take 20 mEq by mouth daily.  . pramipexole (MIRAPEX) 0.5 MG tablet Take 0.5 mg by mouth at bedtime.  [name] PREMARIN vaginal cream Place 1 application vaginally 3 (three) times a week.   Nelva Nay SOLOSTAR 300 UNIT/ML SOPN Inject 42 Units into the skin every evening.   . [DISCONTINUED] JARDIANCE 25 MG TABS tablet Take 25 mg by mouth daily.    No facility-administered encounter medications on file as of 06/28/2019.    Physical Exam: Blood pressure 120/68, pulse 70, temperature (!) 97.1 F (36.2 C), temperature source Temporal, height 5' 2.5" (1.588 m), weight 203 lb 3.2 oz (92.2 kg), SpO2 95 %. Gen:      No acute distress HEENT:  EOMI, sclera anicteric Neck:  No masses; no thyromegaly Lungs:    Clear to auscultation bilaterally; normal respiratory effort CV:         Regular rate and rhythm; no murmurs Abd:      + bowel sounds; soft, non-tender; no palpable masses, no distension Ext:    No edema; adequate peripheral perfusion Skin:      Warm and dry; no rash Neuro: alert and oriented x 3 Psych: normal mood and affect  Data Reviewed: Imaging CT abdomen pelvis 08/28/16-bibasilar opacities, atelectasis Chest x-ray 08/28/16-coarse interstitial marking,  nodular opacity in the left midlung region. Chest x-ray 09/10/17- stable coarse interstitial markings. High-resolution CT chest 09/15/17- bronchial wall thickening with mild emphysema.  Diffuse groundglass attenuation, septal thickening, bronchiectasis with moderate air trapping.  High-resolution CT 04/14/18-mild emphysema.  Stable findings of groundglass opacities, septal thickening, bronchiectasis with air-trapping.  Indeterminate for UIP.  Left main and three-vessel coronary artery disease. High-resolution CT 05/19/2019-stable findings of interstitial lung disease as before.  Possible chronic HP.  With slight progression compared to 2019 I have reviewed the images personally.  PFTs  09/21/17 FVC 1.96 [79%], FEV1 1.64 [90%], F/F 84, TLC 73%, DLCO 12.43 [55%]  Mild restriction with severe diffusion defect that corrects for alveolar volume.  04/22/2018 FVC 2.0 [82%], FEV1 1.73 [96%], F/F 87, TLC 74%, DLCO 12.04 [54%] Mild restriction with severe diffusion defect that corrects for alveolar volume.  01/23/2019 FVC 1.95 [80%], FEV1 1.56 [86%],/F 80, DLCO 10.92 [61%] Moderate-severe reduction in diffusion capacity  FENO 09/10/17- 9  6-minute walk test 11/02/17-  Starting heart rate, sats-69, 93% Starting heart rate, sats-108, 94% Distance walked 408,  Patient stopped with 30 seconds left due to SOB and leg cramps.  Labs: 04/05/17-positive ANA, Ro antibody 1.4  Repeat labs 09/21/17 Blood allergy profile-negative, IgE 4 ILD panel-ANA negative, angiotensin-converting enzyme-86 Rheumatoid factor, CCP-negative Ro antibody 1.1 La negative Hypersensitivity panel-negative  Cardiac Echocardiogram 09/24/17- Normal LV size with mild LV hypertrophy. EF 55-60%. Normal RV size and systolic function. No significant valvular abnormalities. PA peak pressure 31  Cardiac stress test 09/24/2016- low risk study, EF 55-65%.  Bronchoscopy 06/13/2019 Cell count nucleated cells 213, 30% lymphs, 18% eos, 22%  neutrophils, 30% monocyte macrophage Cultures -normal respiratory flora Transbronchial biopsy-isolated nonnecrotizing microgranuloma  Assessment:  Interstitial lung disease CT scan reviewed which shows nonspecific interstitial lung disease with air trapping.  Looks like chronic HP.  She does endorse exposure to hairspray which may be the allergen.  She has been evaluated by Dr. Amil Amen in the past for positive ANA, Ro antibody and was told that there is no evidence of rheumatologic issues.   S/p bronchoscopy this month.  Reviewed her case at multidisciplinary conference With lymphocytosis, granuloma on path and possible exposure to hairspray this is high likelihood of being hypersensitivity pneumonitis. Not sure if there is additional underlying CTD associated ILD with positive serologies  I have asked her to avoid exposure to the hairspray but she insists she needs to use it.  She will at least use a mask while applying the spray. We will try her on prednisone 40 mg a day.  She does have diabetes and will monitor her sugars closely.  Also start Bactrim for pneumocystis prophylaxis I have a low threshold to start CellCept if she does not respond to prednisone as there is still a question of connective tissue disease  Emphysema PFTs shows mild emphysematous changes.  There is no evidence of obstruction on PFTs.  Continue is on trelegy inhaler Continue supplemental oxygen.  GERD She has ongoing GERD and may have reflux contributing to ILD.   Continue prilosec  Plan/Recommendations: - Start prednisone 40 mg a day, Bactrim 3 times a week for pneumocystis prophylaxis - Continue trelegy, supplemental oxygen  Follow up in 2 to 4 weeks.  Marshell Garfinkel MD [facility] 06/28/2019, 11:43 AM

## 2019-06-28 NOTE — Patient Instructions (Addendum)
We will start you on prednisone 40 mg a day Also start you on Bactrim double strength 3 times a week for prophylaxis Make sure you monitor your sugars closely Make sure you use a mask when using the hairspray  Follow-up in 2 to 4 weeks.

## 2019-06-28 NOTE — Progress Notes (Addendum)
   Interstitial Lung Disease Multidisciplinary Conference   Cheryl Brown    MRN [id number]    DOB 07-Feb-1936  Primary Care Physician:Gates, [name], MD  Referring Physician: Marshell Garfinkel MD  Time of Conference: 7.30am- 8.30am Date of conference: 06/20/2019 Location of Conference: -  Virtual  Participating Pulmonary: Dr. Brand Males, MD,  Dr Marshell Garfinkel, MD, Dr Adonis Huguenin DO Pathology: Dr Enid Cutter Radiology: Dr Eddie Candle Others:   Brief History:  83 year old with history of hypertension, diabetes, COPD, OSA (noncompliant with CPAP].  Follow-up for unspecified interstitial lung disease with positive ANA, Ro antibody.  No evidence of CTD per rheumatology evaluation  Serology:  04/05/17-positive ANA, Ro antibody 1.4  Repeat labs 09/21/17 Blood allergy profile-negative, IgE 4 ILD panel-ANA negative, angiotensin-converting enzyme-86 Rheumatoid factor, CCP-negative Ro antibody 1.1 La negative Hypersensitivity panel-negative  MDD discussion of CT scan   High-resolution CT 05/19/2019-stable findings of interstitial lung disease as before.  Possible chronic HP.  With slight progression compared to 2019  Pathology discussion of biopsy: Transbronchial lung biopsy 11/3-isolated nonnecrotizing granuloma, poorly formed  PFTs: 01/23/2019 FVC 1.95 [80%], FEV1 1.56 [86%],/F 80, DLCO 10.92 [61%] Moderate-severe reduction in diffusion capacity  MDD Impression/Recs:  With CT scan compatible with HP, negative exposure probable HP histopathology, BAL lymphocytosis the diagnosis of hypersensitivity pneumonitis is made with moderate confidence.  Time Spent in preparation and discussion:  > 30 min   SIGNATURE    Marshell Garfinkel MD  Pulmonary and Critical Care 07/02/2019, 5:26 PM ...................................................................................................................[name]  References: Diagnosis of Hypersensitivity Pneumonitis in  Adults. An Official ATS/JRS/ALAT Clinical Practice Guideline. Ragu G et al, [place] Aug 1;202(3):e36-e69.

## 2019-06-29 MED ORDER — ATOVAQUONE 750 MG/5ML PO SUSP: 1500.0000 mg | Freq: Every day | ORAL | 1 refills | Status: AC

## 2019-06-29 NOTE — Telephone Encounter (Signed)
Atovaquone only comes as suspension by itself.  The recommended dose for PCP prophylaxis is 1500 mg daily with food.  Prescription sent to local pharmacy.

## 2019-06-29 NOTE — Telephone Encounter (Signed)
Prescribe atovaquone 1.5 g once daily

## 2019-06-29 NOTE — Addendum Note (Signed)
Addended by: Mariella Saa C on: 06/29/2019 02:32 PM   Modules accepted: Orders

## 2019-06-29 NOTE — Telephone Encounter (Signed)
Hi Amber,  Can you help with this. Pt will need Pneumocystis prophylaxis but has sulfa allergies.  Please help with the atovaquone order. Thanks

## 2019-06-29 NOTE — Telephone Encounter (Signed)
Apologies Dr. Vaughan Browner, When ordering the Atovaquone this is what populates in Epic. Please advise which would be most appropriate for patient, quantity and # of refills.  Thank you!

## 2019-07-03 ENCOUNTER — Telehealth: Payer: Self-pay | Admitting: Pulmonary Disease

## 2019-07-03 NOTE — Telephone Encounter (Signed)
Ok to use in tablet form

## 2019-07-03 NOTE — Telephone Encounter (Signed)
Spoke to pharmacist, states that there is no tablet form for Atovaquone.  I advised that the only reason we approved it was because the other pharmacist from the same pharmacy asked for the alternative.  Pharmacist apologized and said that there is no tablet on file to fill for the patient. Pt's allergic to Sulfa- itching, so Bactrim DS 3 times weekly is not an option for pt. Pt can also not afford atovaquone.  Pt is in the "donut hole" with her insurance, so it seems that any med prescribed will be more expensive than usual for pt.  Dr. Vaughan Browner please advise if you have another recommendation for alternate med for pt.  Thanks!

## 2019-07-03 NOTE — Telephone Encounter (Signed)
Unable to reach patient on contact number provided.  Called [facility] pharmacy [phone]. The prescription for Atovaquone (Mepron) 750 mg liquid was the medication the patient is not able to afford. Pharmacy stated it is in tablet form and wanted to know if that can be issued to the patient instead?  Dr. Vaughan Browner, this patient was last seen by you on 06/28/19 and the note stated:  "Assessment:  Interstitial lung disease CT scan reviewed which shows nonspecific interstitial lung disease with air trapping.  Looks like chronic HP.  She does endorse exposure to hairspray which may be the allergen.  She has been evaluated by Dr. Amil Amen in the past for positive ANA, Ro antibody and was told that there is no evidence of rheumatologic issues.   S/p bronchoscopy this month.  Reviewed her case at multidisciplinary conference With lymphocytosis, granuloma on path and possible exposure to hairspray this is high likelihood of being hypersensitivity pneumonitis. Not sure if there is additional underlying CTD associated ILD with positive serologies  I have asked her to avoid exposure to the hairspray but she insists she needs to use it.  She will at least use a mask while applying the spray. We will try her on prednisone 40 mg a day.  She does have diabetes and will monitor her sugars closely.  Also start Bactrim for pneumocystis prophylaxis I have a low threshold to start CellCept if she does not respond to prednisone as there is still a question of connective tissue disease  Emphysema PFTs shows mild emphysematous changes.  There is no evidence of obstruction on PFTs.  Continue is on trelegy inhaler Continue supplemental oxygen.   GERD She has ongoing GERD and may have reflux contributing to ILD.   Continue prilosec  Plan/Recommendations: - Start prednisone 40 mg a day, Bactrim 3 times a week for pneumocystis prophylaxis - Continue trelegy, supplemental oxygen  Follow up in 2 to 4 weeks."  Was  the Atovaquone (Mepron) supposed to be sent to the pharmacy?

## 2019-07-04 NOTE — Telephone Encounter (Signed)
Noted. Will await f/u from PM.

## 2019-07-10 NOTE — Telephone Encounter (Signed)
LMTCB with patient for update.

## 2019-07-11 NOTE — Telephone Encounter (Signed)
ATC pt, NA-  Will forward back to [place] with Ashley's response from [place] to pharmacist, states that there is no tablet form for Atovaquone.  I advised that the only reason we approved it was because the other pharmacist from the same pharmacy asked for the alternative.  Pharmacist apologized and said that there is no tablet on file to fill for the patient. Pt's allergic to Sulfa- itching, so Bactrim DS 3 times weekly is not an option for pt. Pt can also not afford atovaquone.  Pt is in the "donut hole" with her insurance, so it seems that any med prescribed will be more expensive than usual for pt.  Dr. Vaughan Browner please advise if you have another recommendation for alternate med for pt.  Thanks!

## 2019-07-13 LAB — FUNGUS CULTURE RESULT

## 2019-07-13 LAB — FUNGAL ORGANISM REFLEX

## 2019-07-13 LAB — FUNGUS CULTURE WITH STAIN

## 2019-07-13 NOTE — Telephone Encounter (Signed)
Okay let us hold off on these medications.  I will reassess with her at time of return visit.

## 2019-07-13 NOTE — Telephone Encounter (Signed)
Called the pharmacy to make them aware of the response received from Dr. Vaughan Browner. Patient scheduled to see Dr. Vaughan Browner on 07/26/19. Nothing further needed at this time.

## 2019-07-26 ENCOUNTER — Other Ambulatory Visit: Payer: Self-pay

## 2019-07-26 ENCOUNTER — Ambulatory Visit (INDEPENDENT_AMBULATORY_CARE_PROVIDER_SITE_OTHER): Payer: PPO | Admitting: Pulmonary Disease

## 2019-07-26 ENCOUNTER — Encounter: Payer: Self-pay | Admitting: Pulmonary Disease

## 2019-07-26 DIAGNOSIS — J439 Emphysema, unspecified: Secondary | ICD-10-CM

## 2019-07-26 DIAGNOSIS — J679 Hypersensitivity pneumonitis due to unspecified organic dust: Secondary | ICD-10-CM | POA: Diagnosis not present

## 2019-07-26 DIAGNOSIS — J849 Interstitial pulmonary disease, unspecified: Secondary | ICD-10-CM

## 2019-07-26 DIAGNOSIS — K219 Gastro-esophageal reflux disease without esophagitis: Secondary | ICD-10-CM | POA: Diagnosis not present

## 2019-07-26 LAB — ACID FAST CULTURE WITH REFLEXED SENSITIVITIES (MYCOBACTERIA): Acid Fast Culture: NEGATIVE

## 2019-07-26 MED ORDER — TRELEGY ELLIPTA 100-62.5-25 MCG/INH IN AEPB: 2.0000 | INHALATION_SPRAY | Freq: Every day | RESPIRATORY_TRACT | 3 refills | Status: DC

## 2019-07-26 NOTE — Progress Notes (Signed)
Virtual Visit via Telephone Note  I connected with Cheryl Brown on 07/26/19 at 11:30 AM EST by telephone and verified that I am speaking with the correct person using two identifiers.  Location: Patient: Home Provider: [facility], [place], [place]   I discussed the limitations, risks, security and privacy concerns of performing an evaluation and management service by telephone and the availability of in person appointments. I also discussed with the patient that there may be a patient responsible charge related to this service. The patient expressed understanding and agreed to proceed.  History of Present Illness: Follow-up for emphysema, interstitial lung disease, hypersensitivity pneumonitis  83 year old with history of hypertension, diabetes, COPD, OSA (noncompliant with CPAP].   She was diagnosed with COPD in 2012.  Has dyspnea with rest, chronic cough with white mucus, wheezing, occasional fevers and chills at night.  She does not have seasonal allergies. + for acid reflux and indigestion.   Diagnosed with sleep apnea.  She has been intolerant of CPAP and is not interested in trying again. History noted for elevated ANA with positive ro antibody.  She was referred to Dr. Amil Amen in August 2018 but was told she did not have any rheumatologic issue.  She continues to have dry mouth, dry eyes.  Denies any joint pain, rash.  Office note from Dr. Amil Amen, rheumatology dated 05/20/17 Evaluated for positive ANA, SSA. Thought to be false-positive in spite of having xerostomia.  No systemic immunosuppression reccomended as there are no extraglandular manifestation.   Observations/Objective: She underwent bronchoscopy in November 2020 for evaluation of interstitial lung disease.  Reviewed case at multidisciplinary conference.  With lymphocytosis, granuloma on path and possible exposure to hairspray this is high likelihood of being hypersensitivity pneumonitis.   Patient reports daily exposure to hairspray. Not sure if there is additional underlying CTD associated ILD with positive serologies  Has been started on prednisone 40 mg a day.  She is doing well with this.  Has chronic dyspnea on exertion.  No cough, fevers, chills  Assessment and Plan: Hypersensitivity pneumonitis We will start tapering prednisone to 30 mg a day for 2 weeks and then 20 mg a day. Hold at this dose until we can reevaluate her in 4 weeks  Emphysema PFTs shows mild emphysematous changes.  There is no evidence of obstruction on PFTs.  Continue is on trelegy inhaler Continue supplemental oxygen.   GERD She has ongoing GERD and may have reflux contributing to ILD.   Continue prilosec  Follow Up Instructions: Prednisone to 30 mg a day for 2 weeks and then 20 mg a day.  Follow-up in clinic in 4 weeks Continue Trelegy   I discussed the assessment and treatment plan with the patient. The patient was provided an opportunity to ask questions and all were answered. The patient agreed with the plan and demonstrated an understanding of the instructions.   The patient was advised to call back or seek an in-person evaluation if the symptoms worsen or if the condition fails to improve as anticipated.  I provided 25 minutes of non-face-to-face time during this encounter.   Marshell Garfinkel MD [facility] 07/26/2019, 12:17 PM

## 2019-07-27 NOTE — Patient Instructions (Signed)
We will start tapering prednisone to 30 mg a day for 2 weeks and then 20 mg a day. Hold at this dose until we can reevaluate her in 4 weeks  Follow-up in 4 weeks with televisit or in person visit.

## 2019-08-22 DIAGNOSIS — R05 Cough: Secondary | ICD-10-CM | POA: Diagnosis not present

## 2019-08-23 ENCOUNTER — Encounter (HOSPITAL_COMMUNITY): Payer: Self-pay

## 2019-08-23 ENCOUNTER — Other Ambulatory Visit: Payer: PPO

## 2019-08-24 DIAGNOSIS — E119 Type 2 diabetes mellitus without complications: Secondary | ICD-10-CM | POA: Diagnosis not present

## 2019-08-24 DIAGNOSIS — E1140 Type 2 diabetes mellitus with diabetic neuropathy, unspecified: Secondary | ICD-10-CM | POA: Diagnosis not present

## 2019-08-24 DIAGNOSIS — J449 Chronic obstructive pulmonary disease, unspecified: Secondary | ICD-10-CM | POA: Diagnosis not present

## 2019-08-24 DIAGNOSIS — M19039 Primary osteoarthritis, unspecified wrist: Secondary | ICD-10-CM | POA: Diagnosis not present

## 2019-08-24 DIAGNOSIS — M189 Osteoarthritis of first carpometacarpal joint, unspecified: Secondary | ICD-10-CM | POA: Diagnosis not present

## 2019-08-24 DIAGNOSIS — J441 Chronic obstructive pulmonary disease with (acute) exacerbation: Secondary | ICD-10-CM | POA: Diagnosis not present

## 2019-08-24 DIAGNOSIS — I10 Essential (primary) hypertension: Secondary | ICD-10-CM | POA: Diagnosis not present

## 2019-08-24 DIAGNOSIS — J439 Emphysema, unspecified: Secondary | ICD-10-CM | POA: Diagnosis not present

## 2019-08-29 ENCOUNTER — Ambulatory Visit: Payer: PPO | Admitting: Pulmonary Disease

## 2019-09-11 DIAGNOSIS — K5900 Constipation, unspecified: Secondary | ICD-10-CM | POA: Diagnosis not present

## 2019-09-13 DIAGNOSIS — J439 Emphysema, unspecified: Secondary | ICD-10-CM | POA: Diagnosis not present

## 2019-09-14 DIAGNOSIS — Z0000 Encounter for general adult medical examination without abnormal findings: Secondary | ICD-10-CM | POA: Diagnosis not present

## 2019-09-14 DIAGNOSIS — J449 Chronic obstructive pulmonary disease, unspecified: Secondary | ICD-10-CM | POA: Diagnosis not present

## 2019-09-14 DIAGNOSIS — J849 Interstitial pulmonary disease, unspecified: Secondary | ICD-10-CM | POA: Diagnosis not present

## 2019-09-14 DIAGNOSIS — I10 Essential (primary) hypertension: Secondary | ICD-10-CM | POA: Diagnosis not present

## 2019-09-14 DIAGNOSIS — N952 Postmenopausal atrophic vaginitis: Secondary | ICD-10-CM | POA: Diagnosis not present

## 2019-09-14 DIAGNOSIS — K219 Gastro-esophageal reflux disease without esophagitis: Secondary | ICD-10-CM | POA: Diagnosis not present

## 2019-09-14 DIAGNOSIS — Z79899 Other long term (current) drug therapy: Secondary | ICD-10-CM | POA: Diagnosis not present

## 2019-09-14 DIAGNOSIS — Z135 Encounter for screening for eye and ear disorders: Secondary | ICD-10-CM | POA: Diagnosis not present

## 2019-09-14 DIAGNOSIS — E119 Type 2 diabetes mellitus without complications: Secondary | ICD-10-CM | POA: Diagnosis not present

## 2019-09-14 DIAGNOSIS — L299 Pruritus, unspecified: Secondary | ICD-10-CM | POA: Diagnosis not present

## 2019-09-14 DIAGNOSIS — Z1389 Encounter for screening for other disorder: Secondary | ICD-10-CM | POA: Diagnosis not present

## 2019-09-14 DIAGNOSIS — G2581 Restless legs syndrome: Secondary | ICD-10-CM | POA: Diagnosis not present

## 2019-09-14 DIAGNOSIS — K5900 Constipation, unspecified: Secondary | ICD-10-CM | POA: Diagnosis not present

## 2019-09-22 DIAGNOSIS — J439 Emphysema, unspecified: Secondary | ICD-10-CM | POA: Diagnosis not present

## 2019-09-22 DIAGNOSIS — M19039 Primary osteoarthritis, unspecified wrist: Secondary | ICD-10-CM | POA: Diagnosis not present

## 2019-09-22 DIAGNOSIS — I10 Essential (primary) hypertension: Secondary | ICD-10-CM | POA: Diagnosis not present

## 2019-09-22 DIAGNOSIS — E1140 Type 2 diabetes mellitus with diabetic neuropathy, unspecified: Secondary | ICD-10-CM | POA: Diagnosis not present

## 2019-09-22 DIAGNOSIS — E119 Type 2 diabetes mellitus without complications: Secondary | ICD-10-CM | POA: Diagnosis not present

## 2019-09-22 DIAGNOSIS — J441 Chronic obstructive pulmonary disease with (acute) exacerbation: Secondary | ICD-10-CM | POA: Diagnosis not present

## 2019-09-22 DIAGNOSIS — J449 Chronic obstructive pulmonary disease, unspecified: Secondary | ICD-10-CM | POA: Diagnosis not present

## 2019-09-22 DIAGNOSIS — M189 Osteoarthritis of first carpometacarpal joint, unspecified: Secondary | ICD-10-CM | POA: Diagnosis not present

## 2019-10-05 ENCOUNTER — Other Ambulatory Visit: Payer: Self-pay

## 2019-10-05 ENCOUNTER — Ambulatory Visit: Payer: PPO | Admitting: Pulmonary Disease

## 2019-10-05 ENCOUNTER — Encounter: Payer: Self-pay | Admitting: Pulmonary Disease

## 2019-10-05 DIAGNOSIS — J849 Interstitial pulmonary disease, unspecified: Secondary | ICD-10-CM | POA: Diagnosis not present

## 2019-10-05 NOTE — Progress Notes (Signed)
CHERIDA SHEMWELL    [id number]    1935-11-11  Primary Care Physician:Gates, [name], MD  Referring Physician: [name], MD [address] [place],  [place] [address]  Chief complaint: Follow up for emphysema, interstitial lung disease.  HPI: 84 year old with history of hypertension, diabetes, COPD, OSA (noncompliant with CPAP].   She was diagnosed with COPD in 2012.  Has dyspnea with rest, chronic cough with white mucus, wheezing, occasional fevers and chills at night.  She does not have seasonal allergies. + for acid reflux and indigestion.   Diagnosed with sleep apnea.  She has been intolerant of CPAP and is not interested in trying again. History noted for elevated ANA with positive ro antibody.  She was referred to Dr. Amil Amen in August 2018 but was told she did not have any rheumatologic issue.  She continues to have dry mouth, dry eyes.  Denies any joint pain, rash.  Office note from Dr. Amil Amen, rheumatology dated 05/20/17 Evaluated for positive ANA, SSA. Thought to be false-positive in spite of having xerostomia.  No systemic immunosuppression reccomended as there are no extraglandular manifestation.  Underwent bronchoscopy on 06/13/2019 and discussion at multidisciplinary conference on 06/20/2019 with diagnosis of hypersensitivity pneumonitis with moderate confidence.  Pets: No pets Occupation: Retired from [employer] in 1979 Exposures: No known exposures, no mold, hot tub. ILD questionnaire-Negative for exposures. Smoking history: 30-pack-year smoking history.  Quit in 1988 Travel History: Lived in [place] all her life.  No significant travel history.  Interim history: She was started on prednisone on 06/28/19 at 40 mg which was then tapered to 20 mg in 07/26/2019. Came off the prednisone in January 2020.  She did not like being on the steroids which caused weight gain and she cannot tell if it made any difference with her breathing Continues to have  dyspnea on exertion  Diagnosed with COVID-19 in January 2020.  She did not need admission and self isolated at home  Outpatient Encounter Medications as of 10/05/2019  Medication Sig  . albuterol (PROAIR HFA) 108 (90 Base) MCG/ACT inhaler Inhale 2 puffs into the lungs every 6 (six) hours as needed.   [name] amitriptyline (ELAVIL) 50 MG tablet Take 50 mg by mouth at bedtime.  . BD PEN NEEDLE NANO U/F 32G X 4 MM MISC use as directed once daily  . Fluticasone-Umeclidin-Vilant (TRELEGY ELLIPTA) 100-62.5-25 MCG/INH AEPB Inhale 2 puffs into the lungs daily at 12 noon.  . furosemide (LASIX) 20 MG tablet Take 1 tablet (20 mg total) by mouth daily.  [name] glimepiride (AMARYL) 2 MG tablet Take 4 mg by mouth daily.   [name] glucose blood (ONE TOUCH ULTRA TEST) test strip 1 each by Other route daily. Use to check blood sugar daily Dx: E11.41  . losartan (COZAAR) 50 MG tablet Take 50 mg by mouth daily.  [name] omeprazole (PRILOSEC) 20 MG capsule Take 20 mg by mouth daily.  [name] DELICA LANCETS MISC Check blood sugar as directed  . polyethylene glycol powder (MIRALAX) powder Take 34 g by mouth daily. 2 capfuls daily per Urologist   . potassium chloride SA (KLOR-CON) 20 MEQ tablet Take 20 mEq by mouth daily.  . pramipexole (MIRAPEX) 0.5 MG tablet Take 0.5 mg by mouth at bedtime.  [name] PREMARIN vaginal cream Place 1 application vaginally 3 (three) times a week.   Nelva Nay SOLOSTAR 300 UNIT/ML SOPN Inject 42 Units into the skin every evening.   . [DISCONTINUED] predniSONE (DELTASONE)  20 MG tablet Take 2 tablets (40 mg total) by mouth daily with breakfast.   No facility-administered encounter medications on file as of 10/05/2019.   Physical Exam: Blood pressure 124/80, pulse 78, temperature (!) 97.1 F (36.2 C), temperature source Temporal, height 5' 2.5" (1.588 m), weight 211 lb 9.6 oz (96 kg), SpO2 94 %. Gen:      No acute distress HEENT:  EOMI, sclera anicteric Neck:     No masses; no thyromegaly Lungs:    Clear to  auscultation bilaterally; normal respiratory effort CV:         Regular rate and rhythm; no murmurs Abd:      + bowel sounds; soft, non-tender; no palpable masses, no distension Ext:    No edema; adequate peripheral perfusion Skin:      Warm and dry; no rash Neuro: alert and oriented x 3 Psych: normal mood and affect  Data Reviewed: Imaging CT abdomen pelvis 08/28/16-bibasilar opacities, atelectasis Chest x-ray 08/28/16-coarse interstitial marking, nodular opacity in the left midlung region. Chest x-ray 09/10/17- stable coarse interstitial markings. High-resolution CT chest 09/15/17- bronchial wall thickening with mild emphysema.  Diffuse groundglass attenuation, septal thickening, bronchiectasis with moderate air trapping.  High-resolution CT 04/14/18-mild emphysema.  Stable findings of groundglass opacities, septal thickening, bronchiectasis with air-trapping.  Indeterminate for UIP.  Left main and three-vessel coronary artery disease. High-resolution CT 05/19/2019-stable findings of interstitial lung disease as before.  Possible chronic HP.  With slight progression compared to 2019 I have reviewed the images personally.  PFTs  09/21/17 FVC 1.96 [79%], FEV1 1.64 [90%], F/F 84, TLC 73%, DLCO 12.43 [55%]  Mild restriction with severe diffusion defect that corrects for alveolar volume.  04/22/2018 FVC 2.0 [82%], FEV1 1.73 [96%], F/F 87, TLC 74%, DLCO 12.04 [54%] Mild restriction with severe diffusion defect that corrects for alveolar volume.  01/23/2019 FVC 1.95 [80%], FEV1 1.56 [86%],/F 80, DLCO 10.92 [61%] Moderate-severe reduction in diffusion capacity  FENO 09/10/17- 9  6-minute walk test 11/02/17-  Starting heart rate, sats-69, 93% Starting heart rate, sats-108, 94% Distance walked 408,  Patient stopped with 30 seconds left due to SOB and leg cramps.  Labs: 04/05/17-positive ANA, Ro antibody 1.4  Repeat labs 09/21/17 Blood allergy profile-negative, IgE 4 ILD panel-ANA negative,  angiotensin-converting enzyme-86 Rheumatoid factor, CCP-negative Ro antibody 1.1 La negative Hypersensitivity panel-negative  Cardiac Echocardiogram 09/24/17- Normal LV size with mild LV hypertrophy. EF 55-60%. Normal RV size and systolic function. No significant valvular abnormalities. PA peak pressure 31  Cardiac stress test 09/24/2016- low risk study, EF 55-65%.  Bronchoscopy 06/13/2019 Cell count nucleated cells 213, 30% lymphs, 18% eos, 22% neutrophils, 30% monocyte macrophage Cultures -normal respiratory flora Transbronchial biopsy-isolated nonnecrotizing microgranuloma  Assessment:  Interstitial lung disease CT scan reviewed which shows nonspecific interstitial lung disease with air trapping.  Looks like chronic HP.  She does endorse exposure to hairspray which may be the allergen.  She has been evaluated by Dr. Amil Amen in the past for positive ANA, Ro antibody and was told that there is no evidence of rheumatologic issues.   With bronchoscope showing lymphocytosis, granuloma on path and possible exposure to hairspray this is high likelihood of being hypersensitivity pneumonitis. Not sure if there is additional underlying CTD associated ILD with positive serologies  I have asked her to avoid exposure to the hairspray but she insists she needs to use it.  She will at least use a mask while applying the spray. She has not tolerated prednisone.  We will continue monitoring  for now.  If worsening on follow-up high-res CT then reconsider treatment plan.  Her recent COVID-19 infection may muddy the water for ILD follow-up I have a low threshold to start CellCept as there is still a question of connective tissue disease  Emphysema PFTs shows mild emphysematous changes.  There is no evidence of obstruction on PFTs.  Continue trelegy inhaler, supplemental oxygen.   GERD She has ongoing GERD and may have reflux contributing to ILD.   Continue prilosec  Plan/Recommendations: - Monitor  off steroids - Continue trelegy, supplemental oxygen - High-res CT, PFTs in 3 months  Marshell Garfinkel MD [facility] 10/05/2019, 10:50 AM

## 2019-10-05 NOTE — Patient Instructions (Signed)
Schedule high-resolution CT, spirometry and diffusion capacity in 3 months time Follow-up in clinic after this test.

## 2019-10-24 DIAGNOSIS — J439 Emphysema, unspecified: Secondary | ICD-10-CM | POA: Diagnosis not present

## 2019-10-24 DIAGNOSIS — E1140 Type 2 diabetes mellitus with diabetic neuropathy, unspecified: Secondary | ICD-10-CM | POA: Diagnosis not present

## 2019-10-24 DIAGNOSIS — I10 Essential (primary) hypertension: Secondary | ICD-10-CM | POA: Diagnosis not present

## 2019-10-24 DIAGNOSIS — E119 Type 2 diabetes mellitus without complications: Secondary | ICD-10-CM | POA: Diagnosis not present

## 2019-10-24 DIAGNOSIS — M19039 Primary osteoarthritis, unspecified wrist: Secondary | ICD-10-CM | POA: Diagnosis not present

## 2019-10-24 DIAGNOSIS — J441 Chronic obstructive pulmonary disease with (acute) exacerbation: Secondary | ICD-10-CM | POA: Diagnosis not present

## 2019-10-24 DIAGNOSIS — J449 Chronic obstructive pulmonary disease, unspecified: Secondary | ICD-10-CM | POA: Diagnosis not present

## 2019-10-24 DIAGNOSIS — M189 Osteoarthritis of first carpometacarpal joint, unspecified: Secondary | ICD-10-CM | POA: Diagnosis not present

## 2019-10-24 DIAGNOSIS — H353131 Nonexudative age-related macular degeneration, bilateral, early dry stage: Secondary | ICD-10-CM | POA: Diagnosis not present

## 2019-10-24 DIAGNOSIS — Z961 Presence of intraocular lens: Secondary | ICD-10-CM | POA: Diagnosis not present

## 2019-10-25 DIAGNOSIS — H4323 Crystalline deposits in vitreous body, bilateral: Secondary | ICD-10-CM | POA: Diagnosis not present

## 2019-10-25 DIAGNOSIS — H353221 Exudative age-related macular degeneration, left eye, with active choroidal neovascularization: Secondary | ICD-10-CM | POA: Diagnosis not present

## 2019-10-25 DIAGNOSIS — H353111 Nonexudative age-related macular degeneration, right eye, early dry stage: Secondary | ICD-10-CM | POA: Diagnosis not present

## 2019-10-25 DIAGNOSIS — H3562 Retinal hemorrhage, left eye: Secondary | ICD-10-CM | POA: Diagnosis not present

## 2019-10-26 DIAGNOSIS — J439 Emphysema, unspecified: Secondary | ICD-10-CM | POA: Diagnosis not present

## 2019-11-01 ENCOUNTER — Other Ambulatory Visit: Payer: Self-pay | Admitting: Pulmonary Disease

## 2019-11-01 NOTE — Telephone Encounter (Signed)
I called and spoke with patient to assure that she did not need this medication as per last visit note she stopped taking it. She states that she does not need a refill because shes not taking it. I will not refill it.

## 2019-11-13 ENCOUNTER — Other Ambulatory Visit (HOSPITAL_COMMUNITY): Payer: PPO

## 2019-11-16 ENCOUNTER — Other Ambulatory Visit: Payer: PPO

## 2019-11-16 ENCOUNTER — Ambulatory Visit: Payer: PPO | Admitting: Pulmonary Disease

## 2019-11-18 ENCOUNTER — Other Ambulatory Visit (HOSPITAL_COMMUNITY)
Admission: RE | Admit: 2019-11-18 | Discharge: 2019-11-18 | Disposition: A | Discharge status: Home / Self Care | Payer: PPO | Source: Ambulatory Visit | Attending: Pulmonary Disease | Admitting: Pulmonary Disease

## 2019-11-18 DIAGNOSIS — Z20822 Contact with and (suspected) exposure to covid-19: Secondary | ICD-10-CM | POA: Diagnosis not present

## 2019-11-18 DIAGNOSIS — Z01812 Encounter for preprocedural laboratory examination: Secondary | ICD-10-CM | POA: Insufficient documentation

## 2019-11-18 LAB — SARS CORONAVIRUS 2 (TAT 6-24 HRS): SARS Coronavirus 2: NEGATIVE

## 2019-11-21 ENCOUNTER — Ambulatory Visit: Payer: PPO | Admitting: Pulmonary Disease

## 2019-11-21 ENCOUNTER — Ambulatory Visit: Payer: PPO | Admitting: Primary Care

## 2019-11-21 ENCOUNTER — Other Ambulatory Visit: Payer: Self-pay

## 2019-11-21 ENCOUNTER — Encounter: Payer: Self-pay | Admitting: Primary Care

## 2019-11-21 VITALS — BP 154/90 | HR 64 | Temp 97.3°F | Ht 62.5 in | Wt 209.0 lb

## 2019-11-21 DIAGNOSIS — J0100 Acute maxillary sinusitis, unspecified: Secondary | ICD-10-CM | POA: Diagnosis not present

## 2019-11-21 DIAGNOSIS — J849 Interstitial pulmonary disease, unspecified: Secondary | ICD-10-CM

## 2019-11-21 LAB — PULMONARY FUNCTION TEST
DL/VA % pred: 82 %
DL/VA: 3.39 ml/min/mmHg/L
DLCO cor % pred: 62 %
DLCO cor: 11.11 ml/min/mmHg
DLCO unc % pred: 62 %
DLCO unc: 11.11 ml/min/mmHg
FEF 25-75 Pre: 1.32 L/sec
FEF2575-%Pred-Pre: 103 %
FEV1-%Pred-Pre: 82 %
FEV1-Pre: 1.46 L
FEV1FVC-%Pred-Pre: 111 %
FEV6-%Pred-Pre: 75 %
FEV6-Pre: 1.7 L
FEV6FVC-%Pred-Pre: 101 %
FVC-%Pred-Pre: 74 %
FVC-Pre: 1.77 L
Pre FEV1/FVC ratio: 82 %
Pre FEV6/FVC Ratio: 96 %

## 2019-11-21 MED ORDER — DOXYCYCLINE HYCLATE 100 MG PO TABS: 100.0000 mg | ORAL_TABLET | Freq: Two times a day (BID) | ORAL | 0 refills | Status: DC

## 2019-11-21 NOTE — Patient Instructions (Addendum)
Recommendation: - Ocean nasal spray twice day then [place] AYR nasal gel (available over the counter at pharmacy) - [facility] has store in [place] ( [facility] [place])  Rx: - Doxycycline 1 tab twice daily x 1 week   Orders: - Due for HRCT in May   Follow-up: - 3 months with Dr. Vaughan Browner with [facility]    Food Choices for Gastroesophageal Reflux Disease, Adult When you have gastroesophageal reflux disease (GERD), the foods you eat and your eating habits are very important. Choosing the right foods can help ease your discomfort. Think about working with a nutrition specialist (dietitian) to help you make good choices. What are tips for following this plan?  Meals  Choose healthy foods that are low in fat, such as fruits, vegetables, whole grains, low-fat dairy products, and lean meat, fish, and poultry.  Eat small meals often instead of 3 large meals a day. Eat your meals slowly, and in a place where you are relaxed. Avoid bending over or lying down until 2-3 hours after eating.  Avoid eating meals 2-3 hours before bed.  Avoid drinking a lot of liquid with meals.  Cook foods using methods other than frying. Bake, grill, or broil food instead.  Avoid or limit: ? Chocolate. ? Peppermint or spearmint. ? Alcohol. ? Pepper. ? Black and decaffeinated coffee. ? Black and decaffeinated tea. ? Bubbly (carbonated) soft drinks. ? Caffeinated energy drinks and soft drinks.  Limit high-fat foods such as: ? Fatty meat or fried foods. ? Whole milk, cream, butter, or ice cream. ? Nuts and nut butters. ? Pastries, donuts, and sweets made with butter or shortening.  Avoid foods that cause symptoms. These foods may be different for everyone. Common foods that cause symptoms include: ? Tomatoes. ? Oranges, lemons, and limes. ? Peppers. ? Spicy food. ? Onions and garlic. ? Vinegar. Lifestyle  Maintain a healthy weight. Ask your doctor what weight is healthy for you. If you  need to lose weight, work with your doctor to do so safely.  Exercise for at least 30 minutes for 5 or more days each week, or as told by your doctor.  Wear loose-fitting clothes.  Do not smoke. If you need help quitting, ask your doctor.  Sleep with the head of your bed higher than your feet. Use a wedge under the mattress or blocks under the bed frame to raise the head of the bed. Summary  When you have gastroesophageal reflux disease (GERD), food and lifestyle choices are very important in easing your symptoms.  Eat small meals often instead of 3 large meals a day. Eat your meals slowly, and in a place where you are relaxed.  Limit high-fat foods such as fatty meat or fried foods.  Avoid bending over or lying down until 2-3 hours after eating.  Avoid peppermint and spearmint, caffeine, alcohol, and chocolate. This information is not intended to replace advice given to you by your health care provider. Make sure you discuss any questions you have with your health care provider. Document Revised: 11/17/2018 Document Reviewed: 09/01/2016 Elsevier Patient Education  [place].

## 2019-11-21 NOTE — Progress Notes (Signed)
@Patient  ID: Cheryl Brown, female    DOB: 08/27/1935, 84 y.o.   MRN: [id number]  Chief Complaint  Patient presents with  . Follow-up    Referring provider: [name], MD  HPI: 84 year old female, former smoker.  Past medical history significant for COPD with emphysema, ILD, hypersensitivity pneumonitis, COVID-28 August 2018, obstructive sleep apnea, hypertension, GERD, thyroid nodule, type 2 diabetes, hyperlipidemia, restless leg syndrome.  Patient of Dr. Vaughan Browner, last seen October 05, 2019.   Underwent bronchoscopy on 06/13/2019 and discussion at multidisciplinary conference on 06/20/2019 with diagnosis of hypersensitivity pneumonitis with moderate confidence.  She was on prednisone in Novemeber 2020 which was then tapered to 20mg  in Decemebr 2020. She came of prendisone in January 2020. She did no like being on steriods because it caused weight gain and she could not sleep. She had covid in [place] in 2020. She did not need hospital admission.   11/21/2019 Patient presents today for a follow-up with PFTs.  States that her breathing is about the same.  She experiences labored breathing with moderate exertion or long distance walking.  She wears oxygen at night and and occasionally during the day when needed.  Reports nose bleeds with oxygen use. She has humidification. Using warm salt water irrigation. No nasal congestion.  Her GERD symptoms have been stable.  She reports occasional reflux with acidic foods.  She is cut down on grapefruit.  She has been using a mask when applying hairspray as per recommended per Dr. Vaughan Browner.  Pulmonary function test today are mostly stable.  She has a high-resolution CAT scan scheduled for May.   Data Reviewed: Imaging CT abdomen pelvis 08/28/16-bibasilar opacities, atelectasis Chest x-ray 08/28/16-coarse interstitial marking, nodular opacity in the left midlung region. Chest x-ray 09/10/17- stable coarse interstitial markings. High-resolution CT  chest 09/15/17- bronchial wall thickening with mild emphysema.  Diffuse groundglass attenuation, septal thickening, bronchiectasis with moderate air trapping.  High-resolution CT 04/14/18-mild emphysema.  Stable findings of groundglass opacities, septal thickening, bronchiectasis with air-trapping.  Indeterminate for UIP.  Left main and three-vessel coronary artery disease. High-resolution CT 05/19/2019-stable findings of interstitial lung disease as before.  Possible chronic HP.  With slight progression compared to 2019 I have reviewed the images personally.  PFTs  09/21/17 FVC 1.96 [79%], FEV1 1.64 [90%], F/F 84, TLC 73%, DLCO 12.43 [55%]  Mild restriction with severe diffusion defect that corrects for alveolar volume.  04/22/2018 FVC 2.0 [82%], FEV1 1.73 [96%], F/F 87, TLC 74%, DLCO 12.04 [54%] Mild restriction with severe diffusion defect that corrects for alveolar volume.  01/23/2019 FVC 1.95 [80%], FEV1 1.56 [86%],/F 80, DLCO 10.92 [61%] Moderate-severe reduction in diffusion capacity  11/21/2019  FVC 1.77 (74%), FEV1 1.46 (82%), ratio 82, DLCOcor 11.11 (62%) Moderate to severe reduction in diffusion capacity-stable since 2020  FENO 09/10/17- 9  Allergies  Allergen Reactions  . Morphine And Related Shortness Of Breath  . Codeine Nausea Only  . Ipratropium-Albuterol Other (See Comments)    VERY NERVOUS  . Neosporin [Neomycin-Bacitracin Zn-Polymyx] Other (See Comments)    "Had knee surgery and it did not help me heal"  . Niaspan [Niacin] Other (See Comments)    Abdominal pain  . Nitrofurantoin Macrocrystal Nausea Only  . Norvasc [Amlodipine Besylate] Other (See Comments)    10/15/14 headache 08/27/15 abdominal pain, indigestion  . Pregabalin Swelling  . Propoxyphene N-Acetaminophen Other (See Comments)    UPSET STOMACH  . Simvastatin Other (See Comments)    Leg cramps  . Sulfonamide Derivatives Itching  .  Pramipexole Dihydrochloride Itching and Rash    Patient is actively taking      Immunization History  Administered Date(s) Administered  . Influenza Whole 06/30/2007, 05/17/2008  . Influenza, High Dose Seasonal PF 05/17/2013, 05/15/2015, 05/24/2017, 05/17/2019  . Influenza,inj,Quad PF,6+ Mos 05/23/2014  . Pneumococcal Polysaccharide-23 05/10/2000, 05/10/2010    Past Medical History:  Diagnosis Date  . Blood transfusion   . COPD (chronic obstructive pulmonary disease) ([place])   . Diabetes mellitus   . Dysmetabolic syndrome   . Hyperlipemia   . Hypertension   . IBS (irritable bowel syndrome)   . Neoplasm of pituitary gland    Dr.Nudelman   . Palpitations   . Pituitary adenoma ([place]) 1991   Dr.Love and Dr.Nudelman   . Recurrent UTI 2013-14   Dr. Thomasene Mohair and Dr. Marvel Plan  . Reflux esophagitis   . Restless leg syndrome   . Scoliosis    Spinal Stenosis  . Shingles outbreak     Tobacco History: Social History   Tobacco Use  Smoking Status Former Smoker  . Packs/day: 0.50  . Years: 25.00  . Pack years: 12.50  . Types: Cigarettes  . Quit date: 08/10/1978  . Years since quitting: 41.3  Smokeless Tobacco Never Used   Counseling given: Not Answered   Outpatient Medications Prior to Visit  Medication Sig Dispense Refill  . albuterol (PROAIR HFA) 108 (90 Base) MCG/ACT inhaler Inhale 2 puffs into the lungs every 6 (six) hours as needed.     [name] amitriptyline (ELAVIL) 50 MG tablet Take 50 mg by mouth at bedtime.    . BD PEN NEEDLE NANO U/F 32G X 4 MM MISC use as directed once daily 100 each 10  . Fluticasone-Umeclidin-Vilant (TRELEGY ELLIPTA) 100-62.5-25 MCG/INH AEPB Inhale 2 puffs into the lungs daily at 12 noon. 60 each 3  . furosemide (LASIX) 20 MG tablet Take 1 tablet (20 mg total) by mouth daily. 30 tablet 6  . glimepiride (AMARYL) 2 MG tablet Take 4 mg by mouth daily.     [name] glucose blood (ONE TOUCH ULTRA TEST) test strip 1 each by Other route daily. Use to check blood sugar daily Dx: E11.41 90 each 3  . losartan (COZAAR) 50 MG tablet Take 50 mg  by mouth daily.  1  . omeprazole (PRILOSEC) 20 MG capsule Take 20 mg by mouth daily.    [name] DELICA LANCETS MISC Check blood sugar as directed 100 each 3  . polyethylene glycol powder (MIRALAX) powder Take 34 g by mouth daily. 2 capfuls daily per Urologist     . potassium chloride SA (KLOR-CON) 20 MEQ tablet Take 20 mEq by mouth daily.    . pramipexole (MIRAPEX) 0.5 MG tablet Take 0.5 mg by mouth at bedtime.    [name] PREMARIN vaginal cream Place 1 application vaginally 3 (three) times a week.     Nelva Nay SOLOSTAR 300 UNIT/ML SOPN Inject 42 Units into the skin every evening.      No facility-administered medications prior to visit.    Review of Systems  Review of Systems  Constitutional: Positive for fatigue.  HENT: Negative for congestion.        Nosebleeds/nasal dryness  Respiratory: Negative for cough, chest tightness, shortness of breath, wheezing and stridor.        Dyspnea on exertion     Physical Exam  BP (!) 154/90 (BP Location: Left Arm, Cuff Size: Large)   Pulse 64   Temp (!) 97.3 F (36.3 C) (Temporal)  Ht 5' 2.5" (1.588 m)   Wt 209 lb (94.8 kg)   SpO2 (!) 89%   BMI 37.62 kg/m  Physical Exam Constitutional:      Appearance: Normal appearance. She is not ill-appearing.  Cardiovascular:     Rate and Rhythm: Normal rate.  Pulmonary:     Effort: Pulmonary effort is normal. No respiratory distress.     Breath sounds: Normal breath sounds.  Neurological:     General: No focal deficit present.     Mental Status: She is alert. Mental status is at baseline.  Psychiatric:        Mood and Affect: Mood normal.        Thought Content: Thought content normal.      Lab Results:  CBC    Component Value Date/Time   WBC 5.6 05/31/2019 1129   RBC 4.58 05/31/2019 1129   HGB 14.2 05/31/2019 1129   HCT 42.9 05/31/2019 1129   PLT 187.0 05/31/2019 1129   MCV 93.6 05/31/2019 1129   MCH 30.7 08/29/2016 0557   MCHC 33.0 05/31/2019 1129   RDW 13.4 05/31/2019 1129    LYMPHSABS 1.0 05/31/2019 1129   MONOABS 0.4 05/31/2019 1129   EOSABS 0.2 05/31/2019 1129   BASOSABS 0.1 05/31/2019 1129    BMET    Component Value Date/Time   NA 141 08/29/2016 0557   K 4.1 08/29/2016 0557   CL 107 08/29/2016 0557   CO2 25 08/29/2016 0557   GLUCOSE 116 (H) 08/29/2016 0557   BUN 19 08/29/2016 0557   CREATININE 0.69 08/29/2016 0557   CREATININE 0.68 02/10/2016 0950   CALCIUM 9.0 08/29/2016 0557   GFRNONAA >60 08/29/2016 0557   GFRAA >60 08/29/2016 0557    BNP    Component Value Date/Time   BNP 23.8 08/28/2016 1926    ProBNP    Component Value Date/Time   PROBNP 74.6 08/20/2007 0605    Imaging: No results found.   Assessment & Plan:   ILD (interstitial lung disease) ([place]) - Breathing is baseline, experiences dyspnea with long distances.  She has been using mask when applying hairspray - Underwent bronchoscopy on 06/13/2019 and discussion at multidisciplinary conference on 06/20/2019 with diagnosis of hypersensitivity pneumonitis with moderate confidence. - Off prednisone since January 2020  - PFTs 11/21/19 showed moderate to severe reduction in diffusion capacity stable since June 2020/ FEV1 1.46 (82%), ratio 82, DLCOcor 11.11 (62%)  - Due for repeat HRCT in May 2021 - 3 months with Dr. Vaughan Browner with [facility]    ALLERGIC RHINITIS - Acute sinusitis  - Rx doxycycline 1 tab twice daily x 7 days - Recommend Ocean nasal spray twice day then [place] AYR nasal gel    Martyn Ehrich, NP 11/25/2019

## 2019-11-21 NOTE — Progress Notes (Signed)
Spiro and DLCO performed today. 

## 2019-11-24 DIAGNOSIS — H353111 Nonexudative age-related macular degeneration, right eye, early dry stage: Secondary | ICD-10-CM | POA: Diagnosis not present

## 2019-11-24 DIAGNOSIS — H43822 Vitreomacular adhesion, left eye: Secondary | ICD-10-CM | POA: Diagnosis not present

## 2019-11-24 DIAGNOSIS — H353221 Exudative age-related macular degeneration, left eye, with active choroidal neovascularization: Secondary | ICD-10-CM | POA: Diagnosis not present

## 2019-11-24 DIAGNOSIS — H35371 Puckering of macula, right eye: Secondary | ICD-10-CM | POA: Diagnosis not present

## 2019-11-25 ENCOUNTER — Encounter: Payer: Self-pay | Admitting: Primary Care

## 2019-11-25 NOTE — Assessment & Plan Note (Signed)
-   Acute sinusitis  - Rx doxycycline 1 tab twice daily x 7 days - Recommend Ocean nasal spray twice day then [place] AYR nasal gel

## 2019-11-25 NOTE — Assessment & Plan Note (Addendum)
-   Breathing is baseline, experiences dyspnea with long distances.  She has been using mask when applying hairspray - Underwent bronchoscopy on 06/13/2019 and discussion at multidisciplinary conference on 06/20/2019 with diagnosis of hypersensitivity pneumonitis with moderate confidence. - Off prednisone since January 2020  - PFTs 11/21/19 showed moderate to severe reduction in diffusion capacity stable since June 2020/ FEV1 1.46 (82%), ratio 82, DLCOcor 11.11 (62%)  - Due for repeat HRCT in May 2021 - 3 months with Dr. Vaughan Browner with [facility]

## 2019-11-26 DIAGNOSIS — J439 Emphysema, unspecified: Secondary | ICD-10-CM | POA: Diagnosis not present

## 2019-12-01 DIAGNOSIS — M19039 Primary osteoarthritis, unspecified wrist: Secondary | ICD-10-CM | POA: Diagnosis not present

## 2019-12-01 DIAGNOSIS — E119 Type 2 diabetes mellitus without complications: Secondary | ICD-10-CM | POA: Diagnosis not present

## 2019-12-01 DIAGNOSIS — M189 Osteoarthritis of first carpometacarpal joint, unspecified: Secondary | ICD-10-CM | POA: Diagnosis not present

## 2019-12-01 DIAGNOSIS — J441 Chronic obstructive pulmonary disease with (acute) exacerbation: Secondary | ICD-10-CM | POA: Diagnosis not present

## 2019-12-01 DIAGNOSIS — I10 Essential (primary) hypertension: Secondary | ICD-10-CM | POA: Diagnosis not present

## 2019-12-01 DIAGNOSIS — J439 Emphysema, unspecified: Secondary | ICD-10-CM | POA: Diagnosis not present

## 2019-12-01 DIAGNOSIS — J449 Chronic obstructive pulmonary disease, unspecified: Secondary | ICD-10-CM | POA: Diagnosis not present

## 2019-12-01 DIAGNOSIS — E1140 Type 2 diabetes mellitus with diabetic neuropathy, unspecified: Secondary | ICD-10-CM | POA: Diagnosis not present

## 2019-12-26 DIAGNOSIS — J439 Emphysema, unspecified: Secondary | ICD-10-CM | POA: Diagnosis not present

## 2019-12-27 ENCOUNTER — Other Ambulatory Visit: Payer: Self-pay

## 2019-12-27 ENCOUNTER — Ambulatory Visit (INDEPENDENT_AMBULATORY_CARE_PROVIDER_SITE_OTHER)
Admission: RE | Admit: 2019-12-27 | Discharge: 2019-12-27 | Disposition: A | Discharge status: Home / Self Care | Payer: PPO | Source: Ambulatory Visit | Attending: Pulmonary Disease | Admitting: Pulmonary Disease

## 2019-12-27 DIAGNOSIS — J849 Interstitial pulmonary disease, unspecified: Secondary | ICD-10-CM | POA: Diagnosis not present

## 2019-12-27 NOTE — Progress Notes (Signed)
Patient identification verified. Results of recent CT reviewed.  Per Dr. Vaughan Browner, please let patient know that CT shows stable interstitial lung disease with no change.  Patient verbalized understanding of results and need for follow up appointment in July.

## 2019-12-29 DIAGNOSIS — H35371 Puckering of macula, right eye: Secondary | ICD-10-CM | POA: Diagnosis not present

## 2019-12-29 DIAGNOSIS — H43822 Vitreomacular adhesion, left eye: Secondary | ICD-10-CM | POA: Diagnosis not present

## 2019-12-29 DIAGNOSIS — H353221 Exudative age-related macular degeneration, left eye, with active choroidal neovascularization: Secondary | ICD-10-CM | POA: Diagnosis not present

## 2019-12-29 DIAGNOSIS — H353111 Nonexudative age-related macular degeneration, right eye, early dry stage: Secondary | ICD-10-CM | POA: Diagnosis not present

## 2020-01-26 DIAGNOSIS — J439 Emphysema, unspecified: Secondary | ICD-10-CM | POA: Diagnosis not present

## 2020-01-29 DIAGNOSIS — E1140 Type 2 diabetes mellitus with diabetic neuropathy, unspecified: Secondary | ICD-10-CM | POA: Diagnosis not present

## 2020-01-29 DIAGNOSIS — M19039 Primary osteoarthritis, unspecified wrist: Secondary | ICD-10-CM | POA: Diagnosis not present

## 2020-01-29 DIAGNOSIS — J439 Emphysema, unspecified: Secondary | ICD-10-CM | POA: Diagnosis not present

## 2020-01-29 DIAGNOSIS — I10 Essential (primary) hypertension: Secondary | ICD-10-CM | POA: Diagnosis not present

## 2020-01-29 DIAGNOSIS — J449 Chronic obstructive pulmonary disease, unspecified: Secondary | ICD-10-CM | POA: Diagnosis not present

## 2020-01-29 DIAGNOSIS — J441 Chronic obstructive pulmonary disease with (acute) exacerbation: Secondary | ICD-10-CM | POA: Diagnosis not present

## 2020-01-29 DIAGNOSIS — E119 Type 2 diabetes mellitus without complications: Secondary | ICD-10-CM | POA: Diagnosis not present

## 2020-01-29 DIAGNOSIS — M189 Osteoarthritis of first carpometacarpal joint, unspecified: Secondary | ICD-10-CM | POA: Diagnosis not present

## 2020-01-31 DIAGNOSIS — H35371 Puckering of macula, right eye: Secondary | ICD-10-CM | POA: Diagnosis not present

## 2020-01-31 DIAGNOSIS — H353111 Nonexudative age-related macular degeneration, right eye, early dry stage: Secondary | ICD-10-CM | POA: Diagnosis not present

## 2020-01-31 DIAGNOSIS — H353221 Exudative age-related macular degeneration, left eye, with active choroidal neovascularization: Secondary | ICD-10-CM | POA: Diagnosis not present

## 2020-01-31 DIAGNOSIS — H4323 Crystalline deposits in vitreous body, bilateral: Secondary | ICD-10-CM | POA: Diagnosis not present

## 2020-02-22 DIAGNOSIS — Z1231 Encounter for screening mammogram for malignant neoplasm of breast: Secondary | ICD-10-CM | POA: Diagnosis not present

## 2020-02-22 DIAGNOSIS — Z01411 Encounter for gynecological examination (general) (routine) with abnormal findings: Secondary | ICD-10-CM | POA: Diagnosis not present

## 2020-02-22 DIAGNOSIS — L900 Lichen sclerosus et atrophicus: Secondary | ICD-10-CM | POA: Diagnosis not present

## 2020-02-25 DIAGNOSIS — J439 Emphysema, unspecified: Secondary | ICD-10-CM | POA: Diagnosis not present

## 2020-02-28 ENCOUNTER — Other Ambulatory Visit: Payer: Self-pay | Admitting: Obstetrics and Gynecology

## 2020-02-28 DIAGNOSIS — N6310 Unspecified lump in the right breast, unspecified quadrant: Secondary | ICD-10-CM

## 2020-02-28 DIAGNOSIS — N6489 Other specified disorders of breast: Secondary | ICD-10-CM

## 2020-03-04 ENCOUNTER — Ambulatory Visit
Admission: RE | Admit: 2020-03-04 | Discharge: 2020-03-04 | Disposition: A | Discharge status: Home / Self Care | Payer: PPO | Source: Ambulatory Visit | Attending: Obstetrics and Gynecology | Admitting: Obstetrics and Gynecology

## 2020-03-04 ENCOUNTER — Other Ambulatory Visit: Payer: Self-pay

## 2020-03-04 DIAGNOSIS — R922 Inconclusive mammogram: Secondary | ICD-10-CM | POA: Diagnosis not present

## 2020-03-04 DIAGNOSIS — N6489 Other specified disorders of breast: Secondary | ICD-10-CM

## 2020-03-04 DIAGNOSIS — N6310 Unspecified lump in the right breast, unspecified quadrant: Secondary | ICD-10-CM

## 2020-03-04 DIAGNOSIS — N6011 Diffuse cystic mastopathy of right breast: Secondary | ICD-10-CM | POA: Diagnosis not present

## 2020-03-13 DIAGNOSIS — Z7984 Long term (current) use of oral hypoglycemic drugs: Secondary | ICD-10-CM | POA: Diagnosis not present

## 2020-03-13 DIAGNOSIS — G2581 Restless legs syndrome: Secondary | ICD-10-CM | POA: Diagnosis not present

## 2020-03-13 DIAGNOSIS — J449 Chronic obstructive pulmonary disease, unspecified: Secondary | ICD-10-CM | POA: Diagnosis not present

## 2020-03-13 DIAGNOSIS — E1140 Type 2 diabetes mellitus with diabetic neuropathy, unspecified: Secondary | ICD-10-CM | POA: Diagnosis not present

## 2020-03-13 DIAGNOSIS — R600 Localized edema: Secondary | ICD-10-CM | POA: Diagnosis not present

## 2020-03-27 DIAGNOSIS — J439 Emphysema, unspecified: Secondary | ICD-10-CM | POA: Diagnosis not present

## 2020-04-08 ENCOUNTER — Encounter: Payer: Self-pay | Admitting: Pulmonary Disease

## 2020-04-08 ENCOUNTER — Ambulatory Visit: Payer: PPO | Admitting: Pulmonary Disease

## 2020-04-08 ENCOUNTER — Ambulatory Visit (INDEPENDENT_AMBULATORY_CARE_PROVIDER_SITE_OTHER): Payer: PPO

## 2020-04-08 ENCOUNTER — Other Ambulatory Visit: Payer: Self-pay

## 2020-04-08 VITALS — BP 134/80 | HR 91 | Temp 97.9°F | Ht 62.5 in | Wt 207.6 lb

## 2020-04-08 DIAGNOSIS — J849 Interstitial pulmonary disease, unspecified: Secondary | ICD-10-CM | POA: Diagnosis not present

## 2020-04-08 NOTE — Patient Instructions (Signed)
We will get a high-resolution CT and pulmonary function tests around May 2022 Follow-up in clinic after these tests.

## 2020-04-08 NOTE — Progress Notes (Signed)
Six Minute Walk - 04/08/20 1544      Six Minute Walk   Medications taken before test (dose and time) albuterol,Trelegy,Amaryl,Lasix,losartan,omeprazole,potassium- meds taken at 0830 this morning    Supplemental oxygen during test? No    Lap distance in meters  34 meters    Laps Completed 12    Partial lap (in meters) 0 meters    Baseline BP (sitting) 126/74    Baseline Heartrate 81    Baseline Dyspnea ([place] Scale) 1    Baseline Fatigue ([place] Scale) 1    Baseline SPO2 94 %   RA     Interval Oxygen Saturation and HR    2 Minute Oxygen Saturation % --   placed on 2 L [place] after 6 min walk sats 80% RA     End of Test Values    BP (sitting) 136/84    Heartrate 114    Dyspnea ([place] Scale) 1    Fatigue ([place] Scale) 1    SPO2 80 %   Patient RA sats 80%, sats were rising slowly,placed on 2LNC     2 Minutes Post Walk Values   BP (sitting) 134/80    Heartrate 91    SPO2 97 %   On 2LNC   Stopped or paused before six minutes? No    Other Symptoms at end of exercise: --   Patient denies     Interpretation   Distance completed 408 meters    Tech Comments: Patient ambulated at a fast pace, no stops.  O2 at end of 20min was 80% on RA, sats were rising slowly, placed on 2LNC,sats rose to 97%.   Patient checked at 23min post walk, sats 96% RA

## 2020-04-08 NOTE — Progress Notes (Addendum)
Cheryl Brown    [id number]    17-Apr-1936  Primary Care Physician:Gates, [name], MD  Referring Physician: [name], MD [address] [place],  [place] [address]  Chief complaint: Follow up for emphysema, interstitial lung disease.  HPI: 84 year old with history of hypertension, diabetes, COPD, OSA (noncompliant with CPAP].   She was diagnosed with COPD in 2012.  Has dyspnea with rest, chronic cough with white mucus, wheezing, occasional fevers and chills at night.  She does not have seasonal allergies. + for acid reflux and indigestion.   Diagnosed with sleep apnea.  She has been intolerant of CPAP and is not interested in trying again. History noted for elevated ANA with positive ro antibody.  She was referred to Dr. Amil Amen in August 2018 but was told she did not have any rheumatologic issue.  She continues to have dry mouth, dry eyes.  Denies any joint pain, rash.  Office note from Dr. Amil Amen, rheumatology dated 05/20/17 Evaluated for positive ANA, SSA. Thought to be false-positive in spite of having xerostomia.  No systemic immunosuppression reccomended as there are no extraglandular manifestation.  Underwent bronchoscopy on 06/13/2019 and discussion at multidisciplinary conference on 06/20/2019 with diagnosis of hypersensitivity pneumonitis with moderate confidence. She was started on prednisone on 06/28/19 at 40 mg which was then tapered to 20 mg in 07/26/2019. Came off the prednisone in January 2020.  She did not like being on the steroids which caused weight gain and she cannot tell if it made any difference with her breathing  Diagnosed with COVID-19 in January 2020.  She did not need admission and self isolated at home  Pets: No pets Occupation: Retired from [employer] in 1979 Exposures: No known exposures, no mold, hot tub. ILD questionnaire-Negative for exposures. Smoking history: 30-pack-year smoking history.  Quit in 1988 Travel History:  Lived in [place] all her life.  No significant travel history.  Interim history: Remains off prednisone States that she has dyspnea on exertion which is stable.  No new complaints today.  Outpatient Encounter Medications as of 04/08/2020  Medication Sig  . albuterol (PROAIR HFA) 108 (90 Base) MCG/ACT inhaler Inhale 2 puffs into the lungs every 6 (six) hours as needed.   [name] amitriptyline (ELAVIL) 50 MG tablet Take 50 mg by mouth at bedtime.  . BD PEN NEEDLE NANO U/F 32G X 4 MM MISC use as directed once daily  . doxycycline (VIBRA-TABS) 100 MG tablet Take 1 tablet (100 mg total) by mouth 2 (two) times daily.  . Fluticasone-Umeclidin-Vilant (TRELEGY ELLIPTA) 100-62.5-25 MCG/INH AEPB Inhale 2 puffs into the lungs daily at 12 noon.  . furosemide (LASIX) 20 MG tablet Take 1 tablet (20 mg total) by mouth daily.  [name] glimepiride (AMARYL) 2 MG tablet Take 4 mg by mouth daily.   [name] glucose blood (ONE TOUCH ULTRA TEST) test strip 1 each by Other route daily. Use to check blood sugar daily Dx: E11.41  . losartan (COZAAR) 50 MG tablet Take 50 mg by mouth daily.  [name] omeprazole (PRILOSEC) 20 MG capsule Take 20 mg by mouth daily.  [name] DELICA LANCETS MISC Check blood sugar as directed  . polyethylene glycol powder (MIRALAX) powder Take 34 g by mouth daily. 2 capfuls daily per Urologist   . potassium chloride SA (KLOR-CON) 20 MEQ tablet Take 20 mEq by mouth daily.  . pramipexole (MIRAPEX) 0.5 MG tablet Take 0.5 mg by mouth at bedtime.  [name] PREMARIN vaginal cream  Place 1 application vaginally 3 (three) times a week.   Nelva Nay SOLOSTAR 300 UNIT/ML SOPN Inject 42 Units into the skin every evening.    No facility-administered encounter medications on file as of 04/08/2020.   Physical Exam: Blood pressure 134/80, pulse 91, temperature 97.9 F (36.6 C), temperature source Temporal, height 5' 2.5" (1.588 m), weight 207 lb 9.6 oz (94.2 kg), SpO2 92 %. Gen:      No acute distress HEENT:  EOMI, sclera anicteric Neck:      No masses; no thyromegaly Lungs:    Clear to auscultation bilaterally; normal respiratory effort CV:         Regular rate and rhythm; no murmurs Abd:      + bowel sounds; soft, non-tender; no palpable masses, no distension Ext:    No edema; adequate peripheral perfusion Skin:      Warm and dry; no rash Neuro: alert and oriented x 3 Psych: normal mood and affect  Data Reviewed: Imaging CT abdomen pelvis 08/28/16-bibasilar opacities, atelectasis Chest x-ray 08/28/16-coarse interstitial marking, nodular opacity in the left midlung region. Chest x-ray 09/10/17- stable coarse interstitial markings. High-resolution CT chest 09/15/17- bronchial wall thickening with mild emphysema.  Diffuse groundglass attenuation, septal thickening, bronchiectasis with moderate air trapping.  High-resolution CT 04/14/18-mild emphysema.  Stable findings of groundglass opacities, septal thickening, bronchiectasis with air-trapping.  Indeterminate for UIP.  Left main and three-vessel coronary artery disease. High-resolution CT 05/19/2019-stable findings of interstitial lung disease as before.  Possible chronic HP.  With slight progression compared to 2019 CT high-resolution 12/27/2019-stable interstitial lung disease. I have reviewed the images personally.  PFTs  09/21/17 FVC 1.96 [79%], FEV1 1.64 [90%], F/F 84, TLC 73%, DLCO 12.43 [55%]  Mild restriction with severe diffusion defect that corrects for alveolar volume.  04/22/2018 FVC 2.0 [82%], FEV1 1.73 [96%], F/F 87, TLC 74%, DLCO 12.04 [54%] Mild restriction with severe diffusion defect that corrects for alveolar volume.  01/23/2019 FVC 1.95 [80%], FEV1 1.56 [86%],/F 80, DLCO 10.92 [61%] Moderate-severe reduction in diffusion capacity  PFTs 11/21/2019 FVC 1.77 [1 4%), FEV1 1.46 [82%], F/F 82, DLCO 9.11 [62% Moderate diffusion defect.  Stable  FENO 09/10/17- 9  6-minute walk test 11/02/17-  Starting heart rate, sats-69, 93% Starting heart rate, sats-108,  94% Distance walked 408,  Patient stopped with 30 seconds left due to SOB and leg cramps.  6-minute walk 04/08/2020-408 m  Labs: 04/05/17-positive ANA, Ro antibody 1.4  Repeat labs 09/21/17 Blood allergy profile-negative, IgE 4 ILD panel-ANA negative, angiotensin-converting enzyme-86 Rheumatoid factor, CCP-negative Ro antibody 1.1 La negative Hypersensitivity panel-negative  Cardiac Echocardiogram 09/24/17- Normal LV size with mild LV hypertrophy. EF 55-60%. Normal RV size and systolic function. No significant valvular abnormalities. PA peak pressure 31  Cardiac stress test 09/24/2016- low risk study, EF 55-65%.  Bronchoscopy 06/13/2019 Cell count nucleated cells 213, 30% lymphs, 18% eos, 22% neutrophils, 30% monocyte macrophage Cultures -normal respiratory flora Transbronchial biopsy-isolated nonnecrotizing microgranuloma  Assessment:  Interstitial lung disease CT scan reviewed which shows nonspecific interstitial lung disease with air trapping.  Looks like chronic HP.  She does endorse exposure to hairspray which may be the allergen.  She has been evaluated by Dr. Amil Amen in the past for positive ANA, Ro antibody and was told that there is no evidence of rheumatologic issues.   With bronchoscope showing lymphocytosis, granuloma on path and possible exposure to hairspray this is high likelihood of being hypersensitivity pneumonitis. Not sure if there is additional underlying CTD associated ILD with positive  serologies  I have asked her to avoid exposure to the hairspray but she insists she needs to use it.  She will at least use a mask while applying the spray. She has not tolerated prednisone.  We will continue monitoring for now.  If worsening on follow-up high-res CT then reconsider treatment plan.  Her recent COVID-19 infection may muddy the water for ILD follow-up I have a low threshold to start CellCept as there is still a question of connective tissue disease  Emphysema PFTs  shows mild emphysematous changes.  There is no evidence of obstruction on PFTs.  Continue trelegy inhaler, supplemental oxygen.  GERD She has ongoing GERD and may have reflux contributing to ILD.   Continue prilosec  Plan/Recommendations: - Monitor off steroids - Continue trelegy, supplemental oxygen - Follow-up HRCT, PFTs in May 2022  Marshell Garfinkel MD [facility] 04/08/2020, 4:23 PM

## 2020-04-09 ENCOUNTER — Encounter: Payer: Self-pay | Admitting: Pulmonary Disease

## 2020-04-27 DIAGNOSIS — J439 Emphysema, unspecified: Secondary | ICD-10-CM | POA: Diagnosis not present

## 2020-05-01 DIAGNOSIS — E119 Type 2 diabetes mellitus without complications: Secondary | ICD-10-CM | POA: Diagnosis not present

## 2020-05-01 DIAGNOSIS — M19039 Primary osteoarthritis, unspecified wrist: Secondary | ICD-10-CM | POA: Diagnosis not present

## 2020-05-01 DIAGNOSIS — I10 Essential (primary) hypertension: Secondary | ICD-10-CM | POA: Diagnosis not present

## 2020-05-01 DIAGNOSIS — E1140 Type 2 diabetes mellitus with diabetic neuropathy, unspecified: Secondary | ICD-10-CM | POA: Diagnosis not present

## 2020-05-01 DIAGNOSIS — J449 Chronic obstructive pulmonary disease, unspecified: Secondary | ICD-10-CM | POA: Diagnosis not present

## 2020-05-01 DIAGNOSIS — J441 Chronic obstructive pulmonary disease with (acute) exacerbation: Secondary | ICD-10-CM | POA: Diagnosis not present

## 2020-05-01 DIAGNOSIS — J439 Emphysema, unspecified: Secondary | ICD-10-CM | POA: Diagnosis not present

## 2020-05-01 DIAGNOSIS — M189 Osteoarthritis of first carpometacarpal joint, unspecified: Secondary | ICD-10-CM | POA: Diagnosis not present

## 2020-05-08 DIAGNOSIS — H43822 Vitreomacular adhesion, left eye: Secondary | ICD-10-CM | POA: Diagnosis not present

## 2020-05-08 DIAGNOSIS — H353111 Nonexudative age-related macular degeneration, right eye, early dry stage: Secondary | ICD-10-CM | POA: Diagnosis not present

## 2020-05-08 DIAGNOSIS — H353221 Exudative age-related macular degeneration, left eye, with active choroidal neovascularization: Secondary | ICD-10-CM | POA: Diagnosis not present

## 2020-05-08 DIAGNOSIS — H35371 Puckering of macula, right eye: Secondary | ICD-10-CM | POA: Diagnosis not present

## 2020-05-14 DIAGNOSIS — Z20822 Contact with and (suspected) exposure to covid-19: Secondary | ICD-10-CM | POA: Diagnosis not present

## 2020-05-14 DIAGNOSIS — R5383 Other fatigue: Secondary | ICD-10-CM | POA: Diagnosis not present

## 2020-05-22 DIAGNOSIS — J449 Chronic obstructive pulmonary disease, unspecified: Secondary | ICD-10-CM | POA: Diagnosis not present

## 2020-05-22 DIAGNOSIS — E1140 Type 2 diabetes mellitus with diabetic neuropathy, unspecified: Secondary | ICD-10-CM | POA: Diagnosis not present

## 2020-05-22 DIAGNOSIS — J439 Emphysema, unspecified: Secondary | ICD-10-CM | POA: Diagnosis not present

## 2020-05-22 DIAGNOSIS — E119 Type 2 diabetes mellitus without complications: Secondary | ICD-10-CM | POA: Diagnosis not present

## 2020-05-22 DIAGNOSIS — J441 Chronic obstructive pulmonary disease with (acute) exacerbation: Secondary | ICD-10-CM | POA: Diagnosis not present

## 2020-05-22 DIAGNOSIS — I10 Essential (primary) hypertension: Secondary | ICD-10-CM | POA: Diagnosis not present

## 2020-05-27 DIAGNOSIS — J439 Emphysema, unspecified: Secondary | ICD-10-CM | POA: Diagnosis not present

## 2020-05-27 DIAGNOSIS — S61214A Laceration without foreign body of right ring finger without damage to nail, initial encounter: Secondary | ICD-10-CM | POA: Diagnosis not present

## 2020-06-06 DIAGNOSIS — H93293 Other abnormal auditory perceptions, bilateral: Secondary | ICD-10-CM | POA: Diagnosis not present

## 2020-06-06 DIAGNOSIS — H903 Sensorineural hearing loss, bilateral: Secondary | ICD-10-CM | POA: Insufficient documentation

## 2020-06-06 DIAGNOSIS — H6122 Impacted cerumen, left ear: Secondary | ICD-10-CM | POA: Insufficient documentation

## 2020-06-06 DIAGNOSIS — J343 Hypertrophy of nasal turbinates: Secondary | ICD-10-CM | POA: Diagnosis not present

## 2020-06-18 DIAGNOSIS — M189 Osteoarthritis of first carpometacarpal joint, unspecified: Secondary | ICD-10-CM | POA: Diagnosis not present

## 2020-06-18 DIAGNOSIS — K219 Gastro-esophageal reflux disease without esophagitis: Secondary | ICD-10-CM | POA: Diagnosis not present

## 2020-06-18 DIAGNOSIS — I10 Essential (primary) hypertension: Secondary | ICD-10-CM | POA: Diagnosis not present

## 2020-06-18 DIAGNOSIS — J449 Chronic obstructive pulmonary disease, unspecified: Secondary | ICD-10-CM | POA: Diagnosis not present

## 2020-06-18 DIAGNOSIS — M19039 Primary osteoarthritis, unspecified wrist: Secondary | ICD-10-CM | POA: Diagnosis not present

## 2020-06-18 DIAGNOSIS — E1140 Type 2 diabetes mellitus with diabetic neuropathy, unspecified: Secondary | ICD-10-CM | POA: Diagnosis not present

## 2020-06-18 DIAGNOSIS — J441 Chronic obstructive pulmonary disease with (acute) exacerbation: Secondary | ICD-10-CM | POA: Diagnosis not present

## 2020-06-18 DIAGNOSIS — Z23 Encounter for immunization: Secondary | ICD-10-CM | POA: Diagnosis not present

## 2020-06-18 DIAGNOSIS — J439 Emphysema, unspecified: Secondary | ICD-10-CM | POA: Diagnosis not present

## 2020-06-18 DIAGNOSIS — E119 Type 2 diabetes mellitus without complications: Secondary | ICD-10-CM | POA: Diagnosis not present

## 2020-06-27 DIAGNOSIS — J439 Emphysema, unspecified: Secondary | ICD-10-CM | POA: Diagnosis not present

## 2020-07-23 DIAGNOSIS — J441 Chronic obstructive pulmonary disease with (acute) exacerbation: Secondary | ICD-10-CM | POA: Diagnosis not present

## 2020-07-23 DIAGNOSIS — J439 Emphysema, unspecified: Secondary | ICD-10-CM | POA: Diagnosis not present

## 2020-07-23 DIAGNOSIS — E1140 Type 2 diabetes mellitus with diabetic neuropathy, unspecified: Secondary | ICD-10-CM | POA: Diagnosis not present

## 2020-07-23 DIAGNOSIS — K219 Gastro-esophageal reflux disease without esophagitis: Secondary | ICD-10-CM | POA: Diagnosis not present

## 2020-07-23 DIAGNOSIS — I10 Essential (primary) hypertension: Secondary | ICD-10-CM | POA: Diagnosis not present

## 2020-07-23 DIAGNOSIS — M19039 Primary osteoarthritis, unspecified wrist: Secondary | ICD-10-CM | POA: Diagnosis not present

## 2020-07-23 DIAGNOSIS — J449 Chronic obstructive pulmonary disease, unspecified: Secondary | ICD-10-CM | POA: Diagnosis not present

## 2020-07-23 DIAGNOSIS — M189 Osteoarthritis of first carpometacarpal joint, unspecified: Secondary | ICD-10-CM | POA: Diagnosis not present

## 2020-07-23 DIAGNOSIS — E119 Type 2 diabetes mellitus without complications: Secondary | ICD-10-CM | POA: Diagnosis not present

## 2020-07-27 DIAGNOSIS — J439 Emphysema, unspecified: Secondary | ICD-10-CM | POA: Diagnosis not present

## 2020-07-31 DIAGNOSIS — H353221 Exudative age-related macular degeneration, left eye, with active choroidal neovascularization: Secondary | ICD-10-CM | POA: Diagnosis not present

## 2020-08-13 DIAGNOSIS — K219 Gastro-esophageal reflux disease without esophagitis: Secondary | ICD-10-CM | POA: Diagnosis not present

## 2020-08-13 DIAGNOSIS — I10 Essential (primary) hypertension: Secondary | ICD-10-CM | POA: Diagnosis not present

## 2020-08-13 DIAGNOSIS — E119 Type 2 diabetes mellitus without complications: Secondary | ICD-10-CM | POA: Diagnosis not present

## 2020-08-13 DIAGNOSIS — J441 Chronic obstructive pulmonary disease with (acute) exacerbation: Secondary | ICD-10-CM | POA: Diagnosis not present

## 2020-08-13 DIAGNOSIS — M19039 Primary osteoarthritis, unspecified wrist: Secondary | ICD-10-CM | POA: Diagnosis not present

## 2020-08-13 DIAGNOSIS — E1140 Type 2 diabetes mellitus with diabetic neuropathy, unspecified: Secondary | ICD-10-CM | POA: Diagnosis not present

## 2020-08-13 DIAGNOSIS — J439 Emphysema, unspecified: Secondary | ICD-10-CM | POA: Diagnosis not present

## 2020-08-13 DIAGNOSIS — M189 Osteoarthritis of first carpometacarpal joint, unspecified: Secondary | ICD-10-CM | POA: Diagnosis not present

## 2020-08-13 DIAGNOSIS — J449 Chronic obstructive pulmonary disease, unspecified: Secondary | ICD-10-CM | POA: Diagnosis not present

## 2020-08-27 DIAGNOSIS — J439 Emphysema, unspecified: Secondary | ICD-10-CM | POA: Diagnosis not present

## 2020-09-25 DIAGNOSIS — J439 Emphysema, unspecified: Secondary | ICD-10-CM | POA: Diagnosis not present

## 2020-09-25 DIAGNOSIS — J441 Chronic obstructive pulmonary disease with (acute) exacerbation: Secondary | ICD-10-CM | POA: Diagnosis not present

## 2020-09-25 DIAGNOSIS — J449 Chronic obstructive pulmonary disease, unspecified: Secondary | ICD-10-CM | POA: Diagnosis not present

## 2020-09-25 DIAGNOSIS — K219 Gastro-esophageal reflux disease without esophagitis: Secondary | ICD-10-CM | POA: Diagnosis not present

## 2020-09-25 DIAGNOSIS — M19039 Primary osteoarthritis, unspecified wrist: Secondary | ICD-10-CM | POA: Diagnosis not present

## 2020-09-25 DIAGNOSIS — E119 Type 2 diabetes mellitus without complications: Secondary | ICD-10-CM | POA: Diagnosis not present

## 2020-09-25 DIAGNOSIS — E1140 Type 2 diabetes mellitus with diabetic neuropathy, unspecified: Secondary | ICD-10-CM | POA: Diagnosis not present

## 2020-09-25 DIAGNOSIS — I10 Essential (primary) hypertension: Secondary | ICD-10-CM | POA: Diagnosis not present

## 2020-09-25 DIAGNOSIS — M189 Osteoarthritis of first carpometacarpal joint, unspecified: Secondary | ICD-10-CM | POA: Diagnosis not present

## 2020-10-15 ENCOUNTER — Telehealth: Payer: Self-pay | Admitting: Pulmonary Disease

## 2020-10-15 NOTE — Telephone Encounter (Signed)
The HRCT and the PFT both need to be scheduled in May per last OV note.  I have called the pt and made her aware that they will call and set this up closer to that time.

## 2020-10-22 ENCOUNTER — Other Ambulatory Visit: Payer: Self-pay

## 2020-10-22 ENCOUNTER — Encounter: Payer: Self-pay | Admitting: Cardiovascular Disease

## 2020-10-22 ENCOUNTER — Ambulatory Visit: Payer: PPO | Admitting: Cardiovascular Disease

## 2020-10-22 VITALS — BP 140/70 | HR 66 | Ht 62.5 in | Wt 207.0 lb

## 2020-10-22 DIAGNOSIS — I10 Essential (primary) hypertension: Secondary | ICD-10-CM | POA: Diagnosis not present

## 2020-10-22 DIAGNOSIS — R0602 Shortness of breath: Secondary | ICD-10-CM

## 2020-10-22 DIAGNOSIS — E119 Type 2 diabetes mellitus without complications: Secondary | ICD-10-CM | POA: Diagnosis not present

## 2020-10-22 NOTE — Progress Notes (Signed)
Cardiology Office Note:    Date:  10/22/2020   ID:  Cheryl Brown, DOB 02/01/1936, MRN [id number]  PCP:  [name], [place]  Cardiologist:  Sherren Mocha, MD  Advanced Practice Provider:  No care team member to display Electrophysiologist:  None       Referring MD: [name], MD   Chief Complaint  Patient presents with  . Shortness of Breath    History of Present Illness:    Cheryl Brown is a 85 y.o. female with a hx of hypertension, hyperlipidemia, and type 2 diabetes, presenting for follow-up evaluation.  The patient was last seen here in the office in October 2019.  She follows with Dr. Vaughan Browner for COPD and interstitial lung disease.  The patient is here with her husband today. She is most limited by exertional dyspnea. She is supposed to be on home O2 24/7, but generally uses it at night and also with activity. She reports cough and orthopnea as well. No chest pain or pressure. She is on chronic diuretic therapy for treatment of leg swelling.  She reports no cardiac related problems over the last 2 to 3 years.  Past Medical History:  Diagnosis Date  . Blood transfusion   . COPD (chronic obstructive pulmonary disease) ([place])   . Diabetes mellitus   . Dysmetabolic syndrome   . Hyperlipemia   . Hypertension   . IBS (irritable bowel syndrome)   . Neoplasm of pituitary gland    Dr.Nudelman   . Palpitations   . Pituitary adenoma ([place]) 1991   Dr.Love and Dr.Nudelman   . Recurrent UTI 2013-14   Dr. Thomasene Mohair and Dr. Marvel Plan  . Reflux esophagitis   . Restless leg syndrome   . Scoliosis    Spinal Stenosis  . Shingles outbreak     Past Surgical History:  Procedure Laterality Date  . ANKLE FRACTURE SURGERY     left ankle  . BREAST EXCISIONAL BIOPSY Bilateral   . CHOLECYSTECTOMY    . COLONOSCOPY  2014   [facility], [facility]  . CYSTOSCOPY  2014    Dr Thomasene Mohair  . ESOPHAGEAL DILATION  01/2003  . FINGER AMPUTATION      related to work injury   . g2 p2    . KNEE SURGERY  62/7035    complicated by difficult resuscitation post anesthesia   . LAPAROSCOPIC CHOLECYSTECTOMY  03/2011   [facility], [place]     for dysfunctional bleeding   . pituitary adenoma     Dr. Erling Cruz  . UPPER GI ENDOSCOPY  2014   gastric polyp  . URETHRAL DILATION  2014  . VIDEO BRONCHOSCOPY Bilateral 06/13/2019   Procedure: VIDEO BRONCHOSCOPY WITH FLUORO;  Surgeon: Marshell Garfinkel, MD;  Location: [facility];  Service: Cardiopulmonary;  Laterality: Bilateral;    Current Medications: Current Meds  Medication Sig  . albuterol (VENTOLIN HFA) 108 (90 Base) MCG/ACT inhaler Inhale 2 puffs into the lungs every 6 (six) hours as needed.   [name] amitriptyline (ELAVIL) 50 MG tablet Take 50 mg by mouth at bedtime.  . BD PEN NEEDLE NANO U/F 32G X 4 MM MISC use as directed once daily  . clotrimazole (LOTRIMIN) 1 % cream as needed.  [name] estradiol (ESTRACE) 0.1 MG/GM vaginal cream Place 1 g vaginally 3 (three) times a week.  . Fluticasone-Umeclidin-Vilant (TRELEGY ELLIPTA) 100-62.5-25 MCG/INH AEPB Inhale 2 puffs into the lungs daily at 12 noon.  . furosemide (LASIX)  20 MG tablet Take 1 tablet (20 mg total) by mouth daily.  [name] glimepiride (AMARYL) 2 MG tablet Take 4 mg by mouth daily.   [name] glucose blood (ONE TOUCH ULTRA TEST) test strip 1 each by Other route daily. Use to check blood sugar daily Dx: E11.41  . losartan (COZAAR) 50 MG tablet Take 50 mg by mouth daily.  [name] omeprazole (PRILOSEC) 20 MG capsule Take 20 mg by mouth daily.  [name] DELICA LANCETS MISC Check blood sugar as directed  . potassium chloride SA (KLOR-CON) 20 MEQ tablet Take 20 mEq by mouth daily.  . pramipexole (MIRAPEX) 0.5 MG tablet Take 0.5 mg by mouth at bedtime.  [name] tobramycin (TOBREX) 0.3 % ophthalmic solution as directed.  Nelva Nay SOLOSTAR 300 UNIT/ML SOPN Inject 42 Units into the skin every evening.   . [DISCONTINUED] doxycycline (VIBRA-TABS) 100 MG tablet  Take 1 tablet (100 mg total) by mouth 2 (two) times daily.  . [DISCONTINUED] polyethylene glycol powder (GLYCOLAX/MIRALAX) 17 GM/SCOOP powder Take 34 g by mouth daily. 2 capfuls daily per Urologist  . [DISCONTINUED] PREMARIN vaginal cream Place 1 application vaginally 3 (three) times a week.      Allergies:   Morphine and related, Codeine, Empagliflozin, Ipratropium-albuterol, Ipratropium-albuterol, Metformin hcl, Neosporin [neomycin-bacitracin zn-polymyx], Niaspan [niacin], Nitrofurantoin macrocrystal, Norvasc [amlodipine besylate], Pregabalin, Propoxyphene n-acetaminophen, Simvastatin, Sulfonamide derivatives, Pramipexole, and Pramipexole dihydrochloride   Social History   Socioeconomic History  . Marital status: Married    Spouse name: Not on file  . Number of children: 2  . Years of education: 9th  . Highest education level: Not on file  Occupational History  . Occupation: Retired  Tobacco Use  . Smoking status: Former Smoker    Packs/day: 0.50    Years: 25.00    Pack years: 12.50    Types: Cigarettes    Quit date: 08/10/1978    Years since quitting: 42.2  . Smokeless tobacco: Never Used  Vaping Use  . Vaping Use: Never used  Substance and Sexual Activity  . Alcohol use: No  . Drug use: No  . Sexual activity: Not on file  Other Topics Concern  . Not on file  Social History Narrative   Lives at home with her husband.   Right-handed.   No caffeine use.   Social Determinants of Health   Financial Resource Strain: Not on file  Food Insecurity: Not on file  Transportation Needs: Not on file  Physical Activity: Not on file  Stress: Not on file  Social Connections: Not on file     Family History: The patient's family history includes Alzheimer's disease in her maternal aunt, mother, and sister; Arthritis in her mother; Bone cancer in her father; Breast cancer (age of onset: 96) in her sister; Cancer in her father; Coronary artery disease in her father; Diabetes in her  father and sister; Hypertension in her sister; Mental illness in her maternal aunt, maternal grandmother, and mother; Mitral valve prolapse in her sister; Osteoporosis in her sister; Prostate cancer in her father; Stroke in her paternal grandmother.  ROS:   Please see the history of present illness.    All other systems reviewed and are negative.  EKGs/Labs/Other Studies Reviewed:    The following studies were reviewed today: Lexiscan Myoview 09/24/2016: Study Highlights    The left ventricular ejection fraction is normal (55-65%).  Nuclear stress EF: 62%.  There was no ST segment deviation noted during stress.  No T wave inversion was noted during stress.  The study is normal.  This is a low risk study.   Low risk stress nuclear study with normal perfusion and normal left ventricular regional and global systolic function.  Echo 09/24/2017: Study Conclusions   - Left ventricle: The cavity size was normal. Wall thickness was  increased in a pattern of mild LVH. Systolic function was normal.  The estimated ejection fraction was in the range of 55% to 60%.  Wall motion was normal; there were no regional wall motion  abnormalities. Doppler parameters are consistent with abnormal  left ventricular relaxation (grade 1 diastolic dysfunction).  - Aortic valve: There was no stenosis.  - Mitral valve: Mildly calcified annulus. There was no significant  regurgitation.  - Left atrium: The atrium was mildly dilated.  - Right ventricle: The cavity size was normal. Systolic function  was normal.  - Tricuspid valve: Peak RV-RA gradient (S): 28 mm Hg.  - Pulmonary arteries: PA peak pressure: 31 mm Hg (S).  - Inferior vena cava: The vessel was normal in size. The  respirophasic diameter changes were in the normal range (= 50%),  consistent with normal central venous pressure.   EKG:  EKG is ordered today.  The ekg ordered today demonstrates NSR 66 bpm, within normal  limits  Recent Labs: No results found for requested labs within last 8760 hours.  Recent Lipid Panel    Component Value Date/Time   CHOL 196 05/28/2015 1416   TRIG 293.0 (H) 05/28/2015 1416   HDL 47.50 05/28/2015 1416   CHOLHDL 4 05/28/2015 1416   VLDL 58.6 (H) 05/28/2015 1416   LDLCALC 132 (H) 11/16/2013 1418   LDLDIRECT 122.0 05/28/2015 1416     Risk Assessment/Calculations:       Physical Exam:    VS:  BP 140/70   Pulse 66   Ht 5' 2.5" (1.588 m)   Wt 207 lb (93.9 kg)   SpO2 96%   BMI 37.26 kg/m     Wt Readings from Last 3 Encounters:  10/22/20 207 lb (93.9 kg)  04/08/20 207 lb 9.6 oz (94.2 kg)  11/21/19 209 lb (94.8 kg)     GEN:  Well nourished, well developed in no acute distress HEENT: Normal NECK: No JVD; No carotid bruits LYMPHATICS: No lymphadenopathy CARDIAC: RRR, no murmurs, rubs, gallops RESPIRATORY:  Clear to auscultation without rales, wheezing or rhonchi  ABDOMEN: Soft, non-tender, non-distended MUSCULOSKELETAL:  No edema; No deformity  SKIN: Warm and dry NEUROLOGIC:  Alert and oriented x 3 PSYCHIATRIC:  Normal affect   ASSESSMENT:    1. Essential hypertension   2. Shortness of breath   3. Type 2 diabetes mellitus without complication, without long-term current use of insulin (HCC)    PLAN:    In order of problems listed above:  1. Blood pressure is controlled on furosemide and losartan.  Continue current therapy.  Most recent labs reviewed.  She is sees her primary care physician next month.  Suspect she will have repeat labs at that point. 2. Chronic problem, known to have COPD and interstitial lung disease followed closely by pulmonary medicine.  I reviewed her last echocardiogram which showed normal LV systolic function and grade 1 diastolic dysfunction with no evidence of valvular disease.  No evidence of pulmonary hypertension.  No real change in symptoms. 3. Last hemoglobin A1c is 8.7.  Counseling done.  Follow-up primary care.  On a  combination of insulin and oral hypoglycemic medicines.        Medication Adjustments/Labs and Tests  Ordered: Current medicines are reviewed at length with the patient today.  Concerns regarding medicines are outlined above.  Orders Placed This Encounter  Procedures  . EKG 12-Lead   No orders of the defined types were placed in this encounter.   Patient Instructions  Medication Instructions:  Your provider recommends that you continue on your current medications as directed. Please refer to the Current Medication list given to you today.   *If you need a refill on your cardiac medications before your next appointment, please call your pharmacy*   Follow-Up: At [facility], you and your health needs are our priority.  As part of our continuing mission to provide you with exceptional heart care, we have created designated Provider Care Teams.  These Care Teams include your primary Cardiologist (physician) and Advanced Practice Providers (APPs -  Physician Assistants and Nurse Practitioners) who all work together to provide you with the care you need, when you need it. Your next appointment:   12 month(s) The format for your next appointment:   In Person Provider:   You may see Sherren Mocha, MD or one of the following Advanced Practice Providers on your designated Care Team:    Richardson Dopp, PA-C  Robbie Lis, [place]      Signed, Sherren Mocha, MD  10/22/2020 3:35 PM    [place]

## 2020-10-22 NOTE — Patient Instructions (Signed)

## 2020-10-29 DIAGNOSIS — K219 Gastro-esophageal reflux disease without esophagitis: Secondary | ICD-10-CM | POA: Diagnosis not present

## 2020-10-29 DIAGNOSIS — M189 Osteoarthritis of first carpometacarpal joint, unspecified: Secondary | ICD-10-CM | POA: Diagnosis not present

## 2020-10-29 DIAGNOSIS — I10 Essential (primary) hypertension: Secondary | ICD-10-CM | POA: Diagnosis not present

## 2020-10-29 DIAGNOSIS — E119 Type 2 diabetes mellitus without complications: Secondary | ICD-10-CM | POA: Diagnosis not present

## 2020-10-29 DIAGNOSIS — J449 Chronic obstructive pulmonary disease, unspecified: Secondary | ICD-10-CM | POA: Diagnosis not present

## 2020-10-29 DIAGNOSIS — J441 Chronic obstructive pulmonary disease with (acute) exacerbation: Secondary | ICD-10-CM | POA: Diagnosis not present

## 2020-10-29 DIAGNOSIS — J439 Emphysema, unspecified: Secondary | ICD-10-CM | POA: Diagnosis not present

## 2020-10-29 DIAGNOSIS — M19039 Primary osteoarthritis, unspecified wrist: Secondary | ICD-10-CM | POA: Diagnosis not present

## 2020-10-29 DIAGNOSIS — E1140 Type 2 diabetes mellitus with diabetic neuropathy, unspecified: Secondary | ICD-10-CM | POA: Diagnosis not present

## 2020-10-30 DIAGNOSIS — Z8709 Personal history of other diseases of the respiratory system: Secondary | ICD-10-CM | POA: Diagnosis not present

## 2020-10-30 DIAGNOSIS — R062 Wheezing: Secondary | ICD-10-CM | POA: Diagnosis not present

## 2020-10-30 DIAGNOSIS — R059 Cough, unspecified: Secondary | ICD-10-CM | POA: Diagnosis not present

## 2020-10-30 DIAGNOSIS — J40 Bronchitis, not specified as acute or chronic: Secondary | ICD-10-CM | POA: Diagnosis not present

## 2020-10-30 DIAGNOSIS — Z03818 Encounter for observation for suspected exposure to other biological agents ruled out: Secondary | ICD-10-CM | POA: Diagnosis not present

## 2020-11-01 DIAGNOSIS — Z789 Other specified health status: Secondary | ICD-10-CM | POA: Diagnosis not present

## 2020-11-01 DIAGNOSIS — R059 Cough, unspecified: Secondary | ICD-10-CM | POA: Diagnosis not present

## 2020-11-06 DIAGNOSIS — H353111 Nonexudative age-related macular degeneration, right eye, early dry stage: Secondary | ICD-10-CM | POA: Diagnosis not present

## 2020-11-06 DIAGNOSIS — H35371 Puckering of macula, right eye: Secondary | ICD-10-CM | POA: Diagnosis not present

## 2020-11-06 DIAGNOSIS — H353223 Exudative age-related macular degeneration, left eye, with inactive scar: Secondary | ICD-10-CM | POA: Diagnosis not present

## 2020-11-06 DIAGNOSIS — H43822 Vitreomacular adhesion, left eye: Secondary | ICD-10-CM | POA: Diagnosis not present

## 2020-11-14 DIAGNOSIS — Z7984 Long term (current) use of oral hypoglycemic drugs: Secondary | ICD-10-CM | POA: Diagnosis not present

## 2020-11-14 DIAGNOSIS — Z0000 Encounter for general adult medical examination without abnormal findings: Secondary | ICD-10-CM | POA: Diagnosis not present

## 2020-11-14 DIAGNOSIS — J849 Interstitial pulmonary disease, unspecified: Secondary | ICD-10-CM | POA: Diagnosis not present

## 2020-11-14 DIAGNOSIS — E1165 Type 2 diabetes mellitus with hyperglycemia: Secondary | ICD-10-CM | POA: Diagnosis not present

## 2020-11-14 DIAGNOSIS — Z1389 Encounter for screening for other disorder: Secondary | ICD-10-CM | POA: Diagnosis not present

## 2020-11-14 DIAGNOSIS — Z79899 Other long term (current) drug therapy: Secondary | ICD-10-CM | POA: Diagnosis not present

## 2020-11-14 DIAGNOSIS — Z23 Encounter for immunization: Secondary | ICD-10-CM | POA: Diagnosis not present

## 2020-11-14 DIAGNOSIS — J449 Chronic obstructive pulmonary disease, unspecified: Secondary | ICD-10-CM | POA: Diagnosis not present

## 2020-11-14 DIAGNOSIS — E1140 Type 2 diabetes mellitus with diabetic neuropathy, unspecified: Secondary | ICD-10-CM | POA: Diagnosis not present

## 2020-11-14 DIAGNOSIS — G2581 Restless legs syndrome: Secondary | ICD-10-CM | POA: Diagnosis not present

## 2020-11-14 DIAGNOSIS — E559 Vitamin D deficiency, unspecified: Secondary | ICD-10-CM | POA: Diagnosis not present

## 2020-11-14 DIAGNOSIS — I10 Essential (primary) hypertension: Secondary | ICD-10-CM | POA: Diagnosis not present

## 2020-11-14 DIAGNOSIS — N952 Postmenopausal atrophic vaginitis: Secondary | ICD-10-CM | POA: Diagnosis not present

## 2020-11-14 DIAGNOSIS — Z7189 Other specified counseling: Secondary | ICD-10-CM | POA: Diagnosis not present

## 2020-11-14 DIAGNOSIS — K219 Gastro-esophageal reflux disease without esophagitis: Secondary | ICD-10-CM | POA: Diagnosis not present

## 2020-11-27 DIAGNOSIS — J441 Chronic obstructive pulmonary disease with (acute) exacerbation: Secondary | ICD-10-CM | POA: Diagnosis not present

## 2020-11-27 DIAGNOSIS — I10 Essential (primary) hypertension: Secondary | ICD-10-CM | POA: Diagnosis not present

## 2020-11-27 DIAGNOSIS — K219 Gastro-esophageal reflux disease without esophagitis: Secondary | ICD-10-CM | POA: Diagnosis not present

## 2020-11-27 DIAGNOSIS — E1140 Type 2 diabetes mellitus with diabetic neuropathy, unspecified: Secondary | ICD-10-CM | POA: Diagnosis not present

## 2020-11-27 DIAGNOSIS — E119 Type 2 diabetes mellitus without complications: Secondary | ICD-10-CM | POA: Diagnosis not present

## 2020-11-27 DIAGNOSIS — J449 Chronic obstructive pulmonary disease, unspecified: Secondary | ICD-10-CM | POA: Diagnosis not present

## 2020-11-27 DIAGNOSIS — J439 Emphysema, unspecified: Secondary | ICD-10-CM | POA: Diagnosis not present

## 2020-11-28 DIAGNOSIS — Z7984 Long term (current) use of oral hypoglycemic drugs: Secondary | ICD-10-CM | POA: Diagnosis not present

## 2020-11-28 DIAGNOSIS — E119 Type 2 diabetes mellitus without complications: Secondary | ICD-10-CM | POA: Diagnosis not present

## 2020-12-10 ENCOUNTER — Other Ambulatory Visit (HOSPITAL_COMMUNITY)
Admission: RE | Admit: 2020-12-10 | Discharge: 2020-12-10 | Disposition: A | Discharge status: Home / Self Care | Payer: PPO | Source: Ambulatory Visit | Attending: Pulmonary Disease | Admitting: Pulmonary Disease

## 2020-12-10 DIAGNOSIS — Z01812 Encounter for preprocedural laboratory examination: Secondary | ICD-10-CM | POA: Insufficient documentation

## 2020-12-10 DIAGNOSIS — Z20822 Contact with and (suspected) exposure to covid-19: Secondary | ICD-10-CM | POA: Insufficient documentation

## 2020-12-11 LAB — SARS CORONAVIRUS 2 (TAT 6-24 HRS): SARS Coronavirus 2: NEGATIVE

## 2020-12-13 ENCOUNTER — Other Ambulatory Visit: Payer: Self-pay

## 2020-12-13 ENCOUNTER — Ambulatory Visit (INDEPENDENT_AMBULATORY_CARE_PROVIDER_SITE_OTHER): Payer: PPO | Admitting: Pulmonary Disease

## 2020-12-13 DIAGNOSIS — J849 Interstitial pulmonary disease, unspecified: Secondary | ICD-10-CM

## 2020-12-13 LAB — PULMONARY FUNCTION TEST
DL/VA % pred: 78 %
DL/VA: 3.21 ml/min/mmHg/L
DLCO cor % pred: 64 %
DLCO cor: 11.46 ml/min/mmHg
DLCO unc % pred: 64 %
DLCO unc: 11.46 ml/min/mmHg
FEF 25-75 Post: 1.61 L/sec
FEF 25-75 Pre: 1.43 L/sec
FEF2575-%Change-Post: 12 %
FEF2575-%Pred-Post: 131 %
FEF2575-%Pred-Pre: 116 %
FEV1-%Change-Post: 0 %
FEV1-%Pred-Post: 89 %
FEV1-%Pred-Pre: 88 %
FEV1-Post: 1.55 L
FEV1-Pre: 1.54 L
FEV1FVC-%Change-Post: 2 %
FEV1FVC-%Pred-Pre: 109 %
FEV6-%Change-Post: -2 %
FEV6-%Pred-Post: 85 %
FEV6-%Pred-Pre: 87 %
FEV6-Post: 1.88 L
FEV6-Pre: 1.92 L
FEV6FVC-%Pred-Post: 106 %
FEV6FVC-%Pred-Pre: 106 %
FVC-%Change-Post: -2 %
FVC-%Pred-Post: 80 %
FVC-%Pred-Pre: 81 %
FVC-Post: 1.88 L
FVC-Pre: 1.92 L
Post FEV1/FVC ratio: 83 %
Post FEV6/FVC ratio: 100 %
Pre FEV1/FVC ratio: 80 %
Pre FEV6/FVC Ratio: 100 %
RV % pred: 42 %
RV: 1 L
TLC % pred: 65 %
TLC: 3.18 L

## 2020-12-13 NOTE — Progress Notes (Signed)
PFT done today. 

## 2020-12-25 ENCOUNTER — Encounter: Payer: Self-pay | Admitting: Pulmonary Disease

## 2020-12-25 ENCOUNTER — Other Ambulatory Visit: Payer: Self-pay

## 2020-12-25 ENCOUNTER — Ambulatory Visit: Payer: PPO | Admitting: Pulmonary Disease

## 2020-12-25 VITALS — BP 120/82 | HR 66 | Temp 97.2°F | Ht 62.5 in | Wt 208.0 lb

## 2020-12-25 DIAGNOSIS — J849 Interstitial pulmonary disease, unspecified: Secondary | ICD-10-CM

## 2020-12-25 NOTE — Progress Notes (Signed)
Cheryl Brown    [id number]    Feb 08, 1936  Primary Care Physician:Gates, [name], MD  Referring Physician: [name], MD [address] [place],  [place] [address]  Chief complaint: Follow up for emphysema, interstitial lung disease.  HPI: 85 year old with history of hypertension, diabetes, COPD, OSA (noncompliant with CPAP].   She was diagnosed with COPD in 2012.  Has dyspnea with rest, chronic cough with white mucus, wheezing, occasional fevers and chills at night.  She does not have seasonal allergies. + for acid reflux and indigestion.   Diagnosed with sleep apnea.  She has been intolerant of CPAP and is not interested in trying again. History noted for elevated ANA with positive ro antibody.  She was referred to Dr. Amil Amen in August 2018 but was told she did not have any rheumatologic issue.  She continues to have dry mouth, dry eyes.  Denies any joint pain, rash.  Office note from Dr. Amil Amen, rheumatology dated 05/20/17 Evaluated for positive ANA, SSA. Thought to be false-positive in spite of having xerostomia.  No systemic immunosuppression reccomended as there are no extraglandular manifestation.  Underwent bronchoscopy on 06/13/2019 and discussion at multidisciplinary conference on 06/20/2019 with diagnosis of hypersensitivity pneumonitis with moderate confidence. She was started on prednisone on 06/28/19 at 40 mg which was then tapered to 20 mg in 07/26/2019. Came off the prednisone in January 2020.  She did not like being on the steroids which caused weight gain and she cannot tell if it made any difference with her breathing  Diagnosed with COVID-19 in January 2020.  She did not need admission and self isolated at home  Pets: No pets Occupation: Retired from [employer] in 1979 Exposures: No known exposures, no mold, hot tub. ILD questionnaire-Negative for exposures. Smoking history: 30-pack-year smoking history.  Quit in 1988 Travel History:  Lived in [place] all her life.  No significant travel history.  Interim history: Here for annual follow-up.  No new complaints Dyspnea on exertion is stable.  Stopped using the Trelegy as it was not making any difference  She had a viral illness earlier this year, tested negative for COVID.  She received few days of prednisone from her primary care.  She is back to normal now  Outpatient Encounter Medications as of 12/25/2020  Medication Sig  . albuterol (VENTOLIN HFA) 108 (90 Base) MCG/ACT inhaler Inhale 2 puffs into the lungs every 6 (six) hours as needed.   [name] amitriptyline (ELAVIL) 50 MG tablet Take 50 mg by mouth at bedtime.  . BD PEN NEEDLE NANO U/F 32G X 4 MM MISC use as directed once daily  . clotrimazole (LOTRIMIN) 1 % cream as needed.  [name] estradiol (ESTRACE) 0.1 MG/GM vaginal cream Place 1 g vaginally 3 (three) times a week.  . furosemide (LASIX) 20 MG tablet Take 1 tablet (20 mg total) by mouth daily.  [name] glimepiride (AMARYL) 2 MG tablet Take 4 mg by mouth daily.   [name] glucose blood (ONE TOUCH ULTRA TEST) test strip 1 each by Other route daily. Use to check blood sugar daily Dx: E11.41  . losartan (COZAAR) 50 MG tablet Take 50 mg by mouth daily.  [name] omeprazole (PRILOSEC) 20 MG capsule Take 20 mg by mouth daily.  [name] DELICA LANCETS MISC Check blood sugar as directed  . potassium chloride SA (KLOR-CON) 20 MEQ tablet Take 20 mEq by mouth daily.  . pramipexole (MIRAPEX) 0.5 MG tablet Take 0.5 mg by  mouth at bedtime.  [name] tobramycin (TOBREX) 0.3 % ophthalmic solution as directed.  Nelva Nay SOLOSTAR 300 UNIT/ML SOPN Inject 42 Units into the skin every evening.   . Fluticasone-Umeclidin-Vilant (TRELEGY ELLIPTA) 100-62.5-25 MCG/INH AEPB Inhale 2 puffs into the lungs daily at 12 noon. (Patient not taking: Reported on 12/25/2020)   No facility-administered encounter medications on file as of 12/25/2020.   Physical Exam: Blood pressure 120/82, pulse 66, temperature (!) 97.2 F (36.2 C),  temperature source Temporal, height 5' 2.5" (1.588 m), weight 208 lb (94.3 kg), SpO2 97 %. Gen:      No acute distress HEENT:  EOMI, sclera anicteric Neck:     No masses; no thyromegaly Lungs:    Clear to auscultation bilaterally; normal respiratory effort CV:         Regular rate and rhythm; no murmurs Abd:      + bowel sounds; soft, non-tender; no palpable masses, no distension Ext:    No edema; adequate peripheral perfusion Skin:      Warm and dry; no rash Neuro: alert and oriented x 3 Psych: normal mood and affect  Data Reviewed: Imaging CT abdomen pelvis 08/28/16-bibasilar opacities, atelectasis Chest x-ray 08/28/16-coarse interstitial marking, nodular opacity in the left midlung region. Chest x-ray 09/10/17- stable coarse interstitial markings. High-resolution CT chest 09/15/17- bronchial wall thickening with mild emphysema.  Diffuse groundglass attenuation, septal thickening, bronchiectasis with moderate air trapping.  High-resolution CT 04/14/18-mild emphysema.  Stable findings of groundglass opacities, septal thickening, bronchiectasis with air-trapping.  Indeterminate for UIP.  Left main and three-vessel coronary artery disease. High-resolution CT 05/19/2019-stable findings of interstitial lung disease as before.  Possible chronic HP.  With slight progression compared to 2019 CT high-resolution 12/27/2019-stable interstitial lung disease. I have reviewed the images personally.  PFTs  09/21/17 FVC 1.96 [79%], FEV1 1.64 [90%], F/F 84, TLC 73%, DLCO 12.43 [55%]  Mild restriction with severe diffusion defect that corrects for alveolar volume.  04/22/2018 FVC 2.0 [82%], FEV1 1.73 [96%], F/F 87, TLC 74%, DLCO 12.04 [54%] Mild restriction with severe diffusion defect that corrects for alveolar volume.  01/23/2019 FVC 1.95 [80%], FEV1 1.56 [86%],/F 80, DLCO 10.92 [61%] Moderate-severe reduction in diffusion capacity  11/21/2019 FVC 1.77 [1 4%), FEV1 1.46 [82%], F/F 82, DLCO 9.11  [62% Moderate diffusion defect.  Stable  12/13/2020 FVC 1.88 [80%], FEV1 1.55 [9%], F/F 83, TLC 3.18 [64%], DLCO 11.46 [64%] Mild restriction, mild diffusion defect  FENO 09/10/17- 9  6-minute walk test 11/02/17-  Starting heart rate, sats-69, 93% Starting heart rate, sats-108, 94% Distance walked 408,  Patient stopped with 30 seconds left due to SOB and leg cramps.  6-minute walk 04/08/2020-408 m  Labs: 04/05/17-positive ANA, Ro antibody 1.4  Repeat labs 09/21/17 Blood allergy profile-negative, IgE 4 ILD panel-ANA negative, angiotensin-converting enzyme-86 Rheumatoid factor, CCP-negative Ro antibody 1.1 La negative Hypersensitivity panel-negative  Cardiac Echocardiogram 09/24/17- Normal LV size with mild LV hypertrophy. EF 55-60%. Normal RV size and systolic function. No significant valvular abnormalities. PA peak pressure 31  Cardiac stress test 09/24/2016- low risk study, EF 55-65%.  Bronchoscopy 06/13/2019 Cell count nucleated cells 213, 30% lymphs, 18% eos, 22% neutrophils, 30% monocyte macrophage Cultures -normal respiratory flora Transbronchial biopsy-isolated nonnecrotizing microgranuloma  Assessment:  Interstitial lung disease CT scan reviewed which shows nonspecific interstitial lung disease with air trapping.  Looks like chronic HP.  She does endorse exposure to hairspray which may be the allergen.  She has been evaluated by Dr. Amil Amen in the past for positive ANA, Ro  antibody and was told that there is no evidence of rheumatologic issues.   With bronchoscope showing lymphocytosis, granuloma on path and possible exposure to hairspray this is high likelihood of being hypersensitivity pneumonitis. Not sure if there is additional underlying CTD associated ILD with positive serologies  I have asked her to avoid exposure to the hairspray but she insists she needs to use it.  She will at least use a mask while applying the spray. She has not tolerated prednisone.  We will  continue monitoring for now.  If worsening on follow-up high-res CT then reconsider treatment plan.   I have a low threshold to start CellCept as there is still a question of connective tissue disease  PFTs today are largely stable.  She has high-res CT pending later this month  Emphysema PFTs shows mild emphysematous changes.  There is no evidence of obstruction on PFTs.  She is off Trelegy inhaler and supplemental oxygen  GERD She has ongoing GERD and may have reflux contributing to ILD.   Continue prilosec  Plan/Recommendations: - Monitor off steroids - Follow-up HRCT  Marshell Garfinkel MD  Pulmonary and Critical Care 12/25/2020, 10:37 AM

## 2020-12-25 NOTE — Patient Instructions (Signed)
I have reviewed lung function test which is stable  I am glad your breathing is stable as well We will review the CT when it is done Plan follow-up in 1 year

## 2020-12-30 DIAGNOSIS — J439 Emphysema, unspecified: Secondary | ICD-10-CM | POA: Diagnosis not present

## 2020-12-30 DIAGNOSIS — K219 Gastro-esophageal reflux disease without esophagitis: Secondary | ICD-10-CM | POA: Diagnosis not present

## 2020-12-30 DIAGNOSIS — J441 Chronic obstructive pulmonary disease with (acute) exacerbation: Secondary | ICD-10-CM | POA: Diagnosis not present

## 2020-12-30 DIAGNOSIS — E119 Type 2 diabetes mellitus without complications: Secondary | ICD-10-CM | POA: Diagnosis not present

## 2020-12-30 DIAGNOSIS — E1140 Type 2 diabetes mellitus with diabetic neuropathy, unspecified: Secondary | ICD-10-CM | POA: Diagnosis not present

## 2020-12-30 DIAGNOSIS — M19039 Primary osteoarthritis, unspecified wrist: Secondary | ICD-10-CM | POA: Diagnosis not present

## 2020-12-30 DIAGNOSIS — M189 Osteoarthritis of first carpometacarpal joint, unspecified: Secondary | ICD-10-CM | POA: Diagnosis not present

## 2020-12-30 DIAGNOSIS — J449 Chronic obstructive pulmonary disease, unspecified: Secondary | ICD-10-CM | POA: Diagnosis not present

## 2020-12-30 DIAGNOSIS — I10 Essential (primary) hypertension: Secondary | ICD-10-CM | POA: Diagnosis not present

## 2021-01-07 ENCOUNTER — Other Ambulatory Visit: Payer: Self-pay

## 2021-01-07 ENCOUNTER — Ambulatory Visit (INDEPENDENT_AMBULATORY_CARE_PROVIDER_SITE_OTHER)
Admission: RE | Admit: 2021-01-07 | Discharge: 2021-01-07 | Disposition: A | Discharge status: Home / Self Care | Payer: PPO | Source: Ambulatory Visit | Attending: Pulmonary Disease | Admitting: Pulmonary Disease

## 2021-01-07 DIAGNOSIS — J849 Interstitial pulmonary disease, unspecified: Secondary | ICD-10-CM

## 2021-01-07 DIAGNOSIS — R0602 Shortness of breath: Secondary | ICD-10-CM | POA: Diagnosis not present

## 2021-01-14 ENCOUNTER — Telehealth: Payer: Self-pay | Admitting: Pulmonary Disease

## 2021-01-14 NOTE — Telephone Encounter (Signed)
Called and spoke with Patient.  CT results given.  Understanding stated.  Nothing further at this time.     Marshell Garfinkel, MD  01/13/2021 10:08 AM EDT      CT shows scarring of the lung is stable

## 2021-01-29 DIAGNOSIS — H43822 Vitreomacular adhesion, left eye: Secondary | ICD-10-CM | POA: Diagnosis not present

## 2021-01-29 DIAGNOSIS — H353211 Exudative age-related macular degeneration, right eye, with active choroidal neovascularization: Secondary | ICD-10-CM | POA: Diagnosis not present

## 2021-01-29 DIAGNOSIS — H35371 Puckering of macula, right eye: Secondary | ICD-10-CM | POA: Diagnosis not present

## 2021-01-29 DIAGNOSIS — H353223 Exudative age-related macular degeneration, left eye, with inactive scar: Secondary | ICD-10-CM | POA: Diagnosis not present

## 2021-02-06 DIAGNOSIS — I10 Essential (primary) hypertension: Secondary | ICD-10-CM | POA: Diagnosis not present

## 2021-02-06 DIAGNOSIS — J441 Chronic obstructive pulmonary disease with (acute) exacerbation: Secondary | ICD-10-CM | POA: Diagnosis not present

## 2021-02-06 DIAGNOSIS — E1140 Type 2 diabetes mellitus with diabetic neuropathy, unspecified: Secondary | ICD-10-CM | POA: Diagnosis not present

## 2021-02-06 DIAGNOSIS — J439 Emphysema, unspecified: Secondary | ICD-10-CM | POA: Diagnosis not present

## 2021-02-06 DIAGNOSIS — K219 Gastro-esophageal reflux disease without esophagitis: Secondary | ICD-10-CM | POA: Diagnosis not present

## 2021-02-06 DIAGNOSIS — E119 Type 2 diabetes mellitus without complications: Secondary | ICD-10-CM | POA: Diagnosis not present

## 2021-02-06 DIAGNOSIS — J449 Chronic obstructive pulmonary disease, unspecified: Secondary | ICD-10-CM | POA: Diagnosis not present

## 2021-02-26 DIAGNOSIS — H4323 Crystalline deposits in vitreous body, bilateral: Secondary | ICD-10-CM | POA: Diagnosis not present

## 2021-02-26 DIAGNOSIS — H43822 Vitreomacular adhesion, left eye: Secondary | ICD-10-CM | POA: Diagnosis not present

## 2021-02-26 DIAGNOSIS — H353211 Exudative age-related macular degeneration, right eye, with active choroidal neovascularization: Secondary | ICD-10-CM | POA: Diagnosis not present

## 2021-02-26 DIAGNOSIS — H353223 Exudative age-related macular degeneration, left eye, with inactive scar: Secondary | ICD-10-CM | POA: Diagnosis not present

## 2021-03-25 DIAGNOSIS — J439 Emphysema, unspecified: Secondary | ICD-10-CM | POA: Diagnosis not present

## 2021-03-25 DIAGNOSIS — J441 Chronic obstructive pulmonary disease with (acute) exacerbation: Secondary | ICD-10-CM | POA: Diagnosis not present

## 2021-03-25 DIAGNOSIS — K219 Gastro-esophageal reflux disease without esophagitis: Secondary | ICD-10-CM | POA: Diagnosis not present

## 2021-03-25 DIAGNOSIS — J449 Chronic obstructive pulmonary disease, unspecified: Secondary | ICD-10-CM | POA: Diagnosis not present

## 2021-03-25 DIAGNOSIS — I10 Essential (primary) hypertension: Secondary | ICD-10-CM | POA: Diagnosis not present

## 2021-03-25 DIAGNOSIS — E1140 Type 2 diabetes mellitus with diabetic neuropathy, unspecified: Secondary | ICD-10-CM | POA: Diagnosis not present

## 2021-03-25 DIAGNOSIS — M189 Osteoarthritis of first carpometacarpal joint, unspecified: Secondary | ICD-10-CM | POA: Diagnosis not present

## 2021-03-25 DIAGNOSIS — M19039 Primary osteoarthritis, unspecified wrist: Secondary | ICD-10-CM | POA: Diagnosis not present

## 2021-03-25 DIAGNOSIS — E119 Type 2 diabetes mellitus without complications: Secondary | ICD-10-CM | POA: Diagnosis not present

## 2021-04-02 DIAGNOSIS — E1140 Type 2 diabetes mellitus with diabetic neuropathy, unspecified: Secondary | ICD-10-CM | POA: Diagnosis not present

## 2021-04-02 DIAGNOSIS — M5416 Radiculopathy, lumbar region: Secondary | ICD-10-CM | POA: Diagnosis not present

## 2021-04-07 DIAGNOSIS — R519 Headache, unspecified: Secondary | ICD-10-CM | POA: Diagnosis not present

## 2021-04-07 DIAGNOSIS — R6889 Other general symptoms and signs: Secondary | ICD-10-CM | POA: Diagnosis not present

## 2021-04-07 DIAGNOSIS — B338 Other specified viral diseases: Secondary | ICD-10-CM | POA: Diagnosis not present

## 2021-04-07 DIAGNOSIS — Z03818 Encounter for observation for suspected exposure to other biological agents ruled out: Secondary | ICD-10-CM | POA: Diagnosis not present

## 2021-04-07 DIAGNOSIS — R062 Wheezing: Secondary | ICD-10-CM | POA: Diagnosis not present

## 2021-04-07 DIAGNOSIS — J441 Chronic obstructive pulmonary disease with (acute) exacerbation: Secondary | ICD-10-CM | POA: Diagnosis not present

## 2021-04-07 DIAGNOSIS — J069 Acute upper respiratory infection, unspecified: Secondary | ICD-10-CM | POA: Diagnosis not present

## 2021-04-07 DIAGNOSIS — R059 Cough, unspecified: Secondary | ICD-10-CM | POA: Diagnosis not present

## 2021-04-22 DIAGNOSIS — H35371 Puckering of macula, right eye: Secondary | ICD-10-CM | POA: Diagnosis not present

## 2021-04-22 DIAGNOSIS — H43822 Vitreomacular adhesion, left eye: Secondary | ICD-10-CM | POA: Diagnosis not present

## 2021-04-22 DIAGNOSIS — H353223 Exudative age-related macular degeneration, left eye, with inactive scar: Secondary | ICD-10-CM | POA: Diagnosis not present

## 2021-04-22 DIAGNOSIS — H353211 Exudative age-related macular degeneration, right eye, with active choroidal neovascularization: Secondary | ICD-10-CM | POA: Diagnosis not present

## 2021-05-20 DIAGNOSIS — R3915 Urgency of urination: Secondary | ICD-10-CM | POA: Diagnosis not present

## 2021-05-20 DIAGNOSIS — I10 Essential (primary) hypertension: Secondary | ICD-10-CM | POA: Diagnosis not present

## 2021-05-20 DIAGNOSIS — M5416 Radiculopathy, lumbar region: Secondary | ICD-10-CM | POA: Diagnosis not present

## 2021-05-20 DIAGNOSIS — R0902 Hypoxemia: Secondary | ICD-10-CM | POA: Diagnosis not present

## 2021-05-20 DIAGNOSIS — J849 Interstitial pulmonary disease, unspecified: Secondary | ICD-10-CM | POA: Diagnosis not present

## 2021-05-20 DIAGNOSIS — E1140 Type 2 diabetes mellitus with diabetic neuropathy, unspecified: Secondary | ICD-10-CM | POA: Diagnosis not present

## 2021-05-20 DIAGNOSIS — R32 Unspecified urinary incontinence: Secondary | ICD-10-CM | POA: Diagnosis not present

## 2021-05-20 DIAGNOSIS — K219 Gastro-esophageal reflux disease without esophagitis: Secondary | ICD-10-CM | POA: Diagnosis not present

## 2021-05-20 DIAGNOSIS — Z7984 Long term (current) use of oral hypoglycemic drugs: Secondary | ICD-10-CM | POA: Diagnosis not present

## 2021-05-20 DIAGNOSIS — G2581 Restless legs syndrome: Secondary | ICD-10-CM | POA: Diagnosis not present

## 2021-05-20 DIAGNOSIS — J449 Chronic obstructive pulmonary disease, unspecified: Secondary | ICD-10-CM | POA: Diagnosis not present

## 2021-06-03 DIAGNOSIS — E1140 Type 2 diabetes mellitus with diabetic neuropathy, unspecified: Secondary | ICD-10-CM | POA: Diagnosis not present

## 2021-06-03 DIAGNOSIS — J449 Chronic obstructive pulmonary disease, unspecified: Secondary | ICD-10-CM | POA: Diagnosis not present

## 2021-06-03 DIAGNOSIS — R0902 Hypoxemia: Secondary | ICD-10-CM | POA: Diagnosis not present

## 2021-06-03 DIAGNOSIS — R32 Unspecified urinary incontinence: Secondary | ICD-10-CM | POA: Diagnosis not present

## 2021-06-03 DIAGNOSIS — M5416 Radiculopathy, lumbar region: Secondary | ICD-10-CM | POA: Diagnosis not present

## 2021-06-03 DIAGNOSIS — I10 Essential (primary) hypertension: Secondary | ICD-10-CM | POA: Diagnosis not present

## 2021-06-03 DIAGNOSIS — G2581 Restless legs syndrome: Secondary | ICD-10-CM | POA: Diagnosis not present

## 2021-06-03 DIAGNOSIS — Z23 Encounter for immunization: Secondary | ICD-10-CM | POA: Diagnosis not present

## 2021-06-03 DIAGNOSIS — J849 Interstitial pulmonary disease, unspecified: Secondary | ICD-10-CM | POA: Diagnosis not present

## 2021-06-03 DIAGNOSIS — K219 Gastro-esophageal reflux disease without esophagitis: Secondary | ICD-10-CM | POA: Diagnosis not present

## 2021-06-03 DIAGNOSIS — Z7984 Long term (current) use of oral hypoglycemic drugs: Secondary | ICD-10-CM | POA: Diagnosis not present

## 2021-06-03 DIAGNOSIS — R3915 Urgency of urination: Secondary | ICD-10-CM | POA: Diagnosis not present

## 2021-06-17 DIAGNOSIS — H353223 Exudative age-related macular degeneration, left eye, with inactive scar: Secondary | ICD-10-CM | POA: Diagnosis not present

## 2021-06-17 DIAGNOSIS — H353211 Exudative age-related macular degeneration, right eye, with active choroidal neovascularization: Secondary | ICD-10-CM | POA: Diagnosis not present

## 2021-06-17 DIAGNOSIS — H43811 Vitreous degeneration, right eye: Secondary | ICD-10-CM | POA: Diagnosis not present

## 2021-06-17 DIAGNOSIS — H35371 Puckering of macula, right eye: Secondary | ICD-10-CM | POA: Diagnosis not present

## 2021-06-30 DIAGNOSIS — J439 Emphysema, unspecified: Secondary | ICD-10-CM | POA: Diagnosis not present

## 2021-06-30 DIAGNOSIS — Z6835 Body mass index (BMI) 35.0-35.9, adult: Secondary | ICD-10-CM | POA: Diagnosis not present

## 2021-06-30 DIAGNOSIS — E1165 Type 2 diabetes mellitus with hyperglycemia: Secondary | ICD-10-CM | POA: Diagnosis not present

## 2021-06-30 DIAGNOSIS — Z7984 Long term (current) use of oral hypoglycemic drugs: Secondary | ICD-10-CM | POA: Diagnosis not present

## 2021-06-30 DIAGNOSIS — I10 Essential (primary) hypertension: Secondary | ICD-10-CM | POA: Diagnosis not present

## 2021-06-30 DIAGNOSIS — Z794 Long term (current) use of insulin: Secondary | ICD-10-CM | POA: Diagnosis not present

## 2021-07-08 DIAGNOSIS — J439 Emphysema, unspecified: Secondary | ICD-10-CM | POA: Diagnosis not present

## 2021-07-08 DIAGNOSIS — J441 Chronic obstructive pulmonary disease with (acute) exacerbation: Secondary | ICD-10-CM | POA: Diagnosis not present

## 2021-07-08 DIAGNOSIS — E1140 Type 2 diabetes mellitus with diabetic neuropathy, unspecified: Secondary | ICD-10-CM | POA: Diagnosis not present

## 2021-07-08 DIAGNOSIS — I10 Essential (primary) hypertension: Secondary | ICD-10-CM | POA: Diagnosis not present

## 2021-07-08 DIAGNOSIS — J449 Chronic obstructive pulmonary disease, unspecified: Secondary | ICD-10-CM | POA: Diagnosis not present

## 2021-07-08 DIAGNOSIS — K219 Gastro-esophageal reflux disease without esophagitis: Secondary | ICD-10-CM | POA: Diagnosis not present

## 2021-07-08 DIAGNOSIS — E119 Type 2 diabetes mellitus without complications: Secondary | ICD-10-CM | POA: Diagnosis not present

## 2021-07-17 ENCOUNTER — Encounter: Payer: Self-pay | Admitting: Podiatry

## 2021-07-23 ENCOUNTER — Other Ambulatory Visit: Payer: Self-pay

## 2021-07-23 ENCOUNTER — Ambulatory Visit: Payer: PPO | Admitting: Podiatry

## 2021-07-23 ENCOUNTER — Encounter: Payer: Self-pay | Admitting: Podiatry

## 2021-07-23 DIAGNOSIS — Q828 Other specified congenital malformations of skin: Secondary | ICD-10-CM

## 2021-07-23 DIAGNOSIS — M7752 Other enthesopathy of left foot: Secondary | ICD-10-CM

## 2021-07-23 MED ORDER — TRIAMCINOLONE ACETONIDE 10 MG/ML IJ SUSP
10.0000 mg | Freq: Once | INTRAMUSCULAR | Status: AC
  Administered 2021-07-23: 14:00:00 10 mg

## 2021-07-24 NOTE — Progress Notes (Signed)
Subjective:   Patient ID: Cheryl Brown, female   DOB: 85 y.o.   MRN: [id number]   HPI Patient presents with inflammation pain in the outside of the left foot and then has a number of lesions plantar aspect left over right which are painful and hard to walk on.  Patient states she feels like she is walking on fluid   ROS      Objective:  Physical Exam  Neurovascular status intact with inflammation pain of his subfifth metatarsal capsule left with lesion formation x3 left that have lucent course and are painful     Assessment:  Inflammatory capsulitis of the fifth MPJ left with porokeratotic lesions x3 left     Plan:  H&P reviewed the possibility of surgery for this but would recommend holding off and did sterile prep and injected the plantar capsule left 3 mg dexamethasone Kenalog 5 mg Xylocaine debrided 3 separate lesions left no iatrogenic bleeding and reappoint to recheck

## 2021-07-29 DIAGNOSIS — I10 Essential (primary) hypertension: Secondary | ICD-10-CM | POA: Diagnosis not present

## 2021-07-29 DIAGNOSIS — J449 Chronic obstructive pulmonary disease, unspecified: Secondary | ICD-10-CM | POA: Diagnosis not present

## 2021-07-29 DIAGNOSIS — J441 Chronic obstructive pulmonary disease with (acute) exacerbation: Secondary | ICD-10-CM | POA: Diagnosis not present

## 2021-07-29 DIAGNOSIS — K219 Gastro-esophageal reflux disease without esophagitis: Secondary | ICD-10-CM | POA: Diagnosis not present

## 2021-07-29 DIAGNOSIS — E119 Type 2 diabetes mellitus without complications: Secondary | ICD-10-CM | POA: Diagnosis not present

## 2021-07-29 DIAGNOSIS — E11649 Type 2 diabetes mellitus with hypoglycemia without coma: Secondary | ICD-10-CM | POA: Diagnosis not present

## 2021-07-29 DIAGNOSIS — J439 Emphysema, unspecified: Secondary | ICD-10-CM | POA: Diagnosis not present

## 2021-07-29 DIAGNOSIS — E1140 Type 2 diabetes mellitus with diabetic neuropathy, unspecified: Secondary | ICD-10-CM | POA: Diagnosis not present

## 2021-08-06 ENCOUNTER — Other Ambulatory Visit: Payer: Self-pay | Admitting: Obstetrics and Gynecology

## 2021-08-06 DIAGNOSIS — Z1231 Encounter for screening mammogram for malignant neoplasm of breast: Secondary | ICD-10-CM

## 2021-08-12 DIAGNOSIS — G2581 Restless legs syndrome: Secondary | ICD-10-CM | POA: Diagnosis not present

## 2021-08-12 DIAGNOSIS — Z7984 Long term (current) use of oral hypoglycemic drugs: Secondary | ICD-10-CM | POA: Diagnosis not present

## 2021-08-12 DIAGNOSIS — E119 Type 2 diabetes mellitus without complications: Secondary | ICD-10-CM | POA: Diagnosis not present

## 2021-08-12 DIAGNOSIS — E11649 Type 2 diabetes mellitus with hypoglycemia without coma: Secondary | ICD-10-CM | POA: Diagnosis not present

## 2021-08-12 DIAGNOSIS — E1140 Type 2 diabetes mellitus with diabetic neuropathy, unspecified: Secondary | ICD-10-CM | POA: Diagnosis not present

## 2021-08-12 DIAGNOSIS — J449 Chronic obstructive pulmonary disease, unspecified: Secondary | ICD-10-CM | POA: Diagnosis not present

## 2021-08-13 DIAGNOSIS — R051 Acute cough: Secondary | ICD-10-CM | POA: Diagnosis not present

## 2021-08-13 DIAGNOSIS — J040 Acute laryngitis: Secondary | ICD-10-CM | POA: Diagnosis not present

## 2021-08-13 DIAGNOSIS — Z03818 Encounter for observation for suspected exposure to other biological agents ruled out: Secondary | ICD-10-CM | POA: Diagnosis not present

## 2021-08-13 DIAGNOSIS — R062 Wheezing: Secondary | ICD-10-CM | POA: Diagnosis not present

## 2021-08-19 DIAGNOSIS — H353223 Exudative age-related macular degeneration, left eye, with inactive scar: Secondary | ICD-10-CM | POA: Diagnosis not present

## 2021-08-19 DIAGNOSIS — H35371 Puckering of macula, right eye: Secondary | ICD-10-CM | POA: Diagnosis not present

## 2021-08-19 DIAGNOSIS — H353211 Exudative age-related macular degeneration, right eye, with active choroidal neovascularization: Secondary | ICD-10-CM | POA: Diagnosis not present

## 2021-08-19 DIAGNOSIS — H4323 Crystalline deposits in vitreous body, bilateral: Secondary | ICD-10-CM | POA: Diagnosis not present

## 2021-08-22 ENCOUNTER — Ambulatory Visit: Payer: PPO

## 2021-09-09 ENCOUNTER — Other Ambulatory Visit: Payer: Self-pay

## 2021-09-09 ENCOUNTER — Ambulatory Visit
Admission: RE | Admit: 2021-09-09 | Discharge: 2021-09-09 | Disposition: A | Discharge status: Home / Self Care | Payer: PPO | Source: Ambulatory Visit | Attending: Obstetrics and Gynecology | Admitting: Obstetrics and Gynecology

## 2021-09-09 DIAGNOSIS — Z1231 Encounter for screening mammogram for malignant neoplasm of breast: Secondary | ICD-10-CM

## 2021-09-10 DIAGNOSIS — J441 Chronic obstructive pulmonary disease with (acute) exacerbation: Secondary | ICD-10-CM | POA: Diagnosis not present

## 2021-09-23 ENCOUNTER — Encounter: Payer: Self-pay | Admitting: Pulmonary Disease

## 2021-09-23 ENCOUNTER — Other Ambulatory Visit: Payer: Self-pay

## 2021-09-23 ENCOUNTER — Ambulatory Visit (INDEPENDENT_AMBULATORY_CARE_PROVIDER_SITE_OTHER): Payer: PPO | Admitting: Pulmonary Disease

## 2021-09-23 VITALS — BP 124/76 | HR 72 | Temp 98.2°F | Ht 62.5 in | Wt 204.6 lb

## 2021-09-23 DIAGNOSIS — J849 Interstitial pulmonary disease, unspecified: Secondary | ICD-10-CM | POA: Diagnosis not present

## 2021-09-23 NOTE — Progress Notes (Signed)
Cheryl Brown    [id number]    [birth year]  Primary Care Physician:Gates, [name], MD  Referring Physician: [name], MD [address] [place],  [place] [address]  Chief complaint: Follow up for emphysema, interstitial lung disease.  HPI: 86 year old with history of hypertension, diabetes, COPD, OSA (noncompliant with CPAP].   She was diagnosed with COPD in 2012.  Has dyspnea with rest, chronic cough with white mucus, wheezing, occasional fevers and chills at night.  She does not have seasonal allergies. + for acid reflux and indigestion.   Diagnosed with sleep apnea.  She has been intolerant of CPAP and is not interested in trying again. History noted for elevated ANA with positive ro antibody.  She was referred to Dr. Amil Amen in August 2018 but was told she did not have any rheumatologic issue.  She continues to have dry mouth, dry eyes.  Denies any joint pain, rash.  Office note from Dr. Amil Amen, rheumatology dated 05/20/17 Evaluated for positive ANA, SSA. Thought to be false-positive in spite of having xerostomia.  No systemic immunosuppression reccomended as there are no extraglandular manifestation.  Underwent bronchoscopy on 06/13/2019 and discussion at multidisciplinary conference on 06/20/2019 with diagnosis of hypersensitivity pneumonitis with moderate confidence. She was started on prednisone on 06/28/19 at 40 mg which was then tapered to 20 mg in 07/26/2019. Came off the prednisone in January 2020.  She did not like being on the steroids which caused weight gain and she cannot tell if it made any difference with her breathing  Diagnosed with COVID-19 in January 2020.  She did not need admission and self isolated at home  Pets: No pets Occupation: Retired from [employer] in 1979 Exposures: No known exposures, no mold, hot tub. ILD questionnaire-Negative for exposures. Smoking history: 30-pack-year smoking history.  Quit in 1988 Travel History:  Lived in [place] all her life.  No significant travel history.  Interim history: She is stable with regard to breathing.  Has chronic dyspnea on exertion without change  Outpatient Encounter Medications as of 09/23/2021  Medication Sig   albuterol (VENTOLIN HFA) 108 (90 Base) MCG/ACT inhaler Inhale 2 puffs into the lungs every 6 (six) hours as needed.    amitriptyline (ELAVIL) 50 MG tablet Take 50 mg by mouth at bedtime.   BD PEN NEEDLE NANO U/F 32G X 4 MM MISC use as directed once daily   clotrimazole (LOTRIMIN) 1 % cream as needed.   estradiol (ESTRACE) 0.1 MG/GM vaginal cream Place 1 g vaginally 3 (three) times a week.   Fluticasone-Umeclidin-Vilant (TRELEGY ELLIPTA) 100-62.5-25 MCG/INH AEPB Inhale 2 puffs into the lungs daily at 12 noon.   furosemide (LASIX) 20 MG tablet Take 1 tablet (20 mg total) by mouth daily.   glimepiride (AMARYL) 2 MG tablet Take 4 mg by mouth daily.    glucose blood (ONE TOUCH ULTRA TEST) test strip 1 each by Other route daily. Use to check blood sugar daily Dx: E11.41   losartan (COZAAR) 50 MG tablet Take 50 mg by mouth daily.   omeprazole (PRILOSEC) 20 MG capsule Take 20 mg by mouth daily.   ONETOUCH DELICA LANCETS MISC Check blood sugar as directed   potassium chloride SA (KLOR-CON) 20 MEQ tablet Take 20 mEq by mouth daily.   pramipexole (MIRAPEX) 0.5 MG tablet Take 0.5 mg by mouth at bedtime.   tobramycin (TOBREX) 0.3 % ophthalmic solution as directed.   TOUJEO SOLOSTAR 300 UNIT/ML SOPN Inject 42 Units  into the skin every evening.    No facility-administered encounter medications on file as of 09/23/2021.   Physical Exam: Blood pressure 124/76, pulse 72, temperature 98.2 F (36.8 C), temperature source Oral, height 5' 2.5" (1.588 m), weight 204 lb 9.6 oz (92.8 kg), SpO2 96 %. Gen:      No acute distress HEENT:  EOMI, sclera anicteric Neck:     No masses; no thyromegaly Lungs:    Clear to auscultation bilaterally; normal respiratory effort CV:          Regular rate and rhythm; no murmurs Abd:      + bowel sounds; soft, non-tender; no palpable masses, no distension Ext:    No edema; adequate peripheral perfusion Skin:      Warm and dry; no rash Neuro: alert and oriented x 3 Psych: normal mood and affect   Data Reviewed: Imaging CT abdomen pelvis 08/28/16-bibasilar opacities, atelectasis Chest x-ray 08/28/16-coarse interstitial marking, nodular opacity in the left midlung region. Chest x-ray 09/10/17- stable coarse interstitial markings. High-resolution CT chest 09/15/17- bronchial wall thickening with mild emphysema.  Diffuse groundglass attenuation, septal thickening, bronchiectasis with moderate air trapping.  High-resolution CT 04/14/18-mild emphysema.  Stable findings of groundglass opacities, septal thickening, bronchiectasis with air-trapping.  Indeterminate for UIP.  Left main and three-vessel coronary artery disease. High-resolution CT 05/19/2019-stable findings of interstitial lung disease as before.  Possible chronic HP.  With slight progression compared to 2019 CT high-resolution 12/27/2019-stable interstitial lung disease. I have reviewed the images personally.  PFTs  09/21/17 FVC 1.96 [79%], FEV1 1.64 [90%], F/F 84, TLC 73%, DLCO 12.43 [55%]  Mild restriction with severe diffusion defect that corrects for alveolar volume.  04/22/2018 FVC 2.0 [82%], FEV1 1.73 [96%], F/F 87, TLC 74%, DLCO 12.04 [54%] Mild restriction with severe diffusion defect that corrects for alveolar volume.  01/23/2019 FVC 1.95 [80%], FEV1 1.56 [86%],/F 80, DLCO 10.92 [61%] Moderate-severe reduction in diffusion capacity  11/21/2019 FVC 1.77 [1 4%), FEV1 1.46 [82%], F/F 82, DLCO 9.11 [62% Moderate diffusion defect.  Stable  12/13/2020 FVC 1.88 [80%], FEV1 1.55 [9%], F/F 83, TLC 3.18 [64%], DLCO 11.46 [64%] Mild restriction, mild diffusion defect  FENO 09/10/17- 9  6-minute walk test 11/02/17-  Starting heart rate, sats-69, 93% Starting heart rate, sats-108,  94% Distance walked 408,  Patient stopped with 30 seconds left due to SOB and leg cramps.  6-minute walk 04/08/2020-408 m  Labs: 04/05/17-positive ANA, Ro antibody 1.4  Repeat labs 09/21/17 Blood allergy profile-negative, IgE 4 ILD panel-ANA negative, angiotensin-converting enzyme-86 Rheumatoid factor, CCP-negative Ro antibody 1.1 La negative Hypersensitivity panel-negative  Cardiac Echocardiogram 09/24/17- Normal LV size with mild LV hypertrophy. EF 55-60%. Normal RV size and systolic function. No significant valvular abnormalities. PA peak pressure 31  Cardiac stress test 09/24/2016- low risk study, EF 55-65%.  Bronchoscopy 06/13/2019 Cell count nucleated cells 213, 30% lymphs, 18% eos, 22% neutrophils, 30% monocyte macrophage Cultures -normal respiratory flora Transbronchial biopsy-isolated nonnecrotizing microgranuloma  Assessment:  Interstitial lung disease CT scan reviewed which shows nonspecific interstitial lung disease with air trapping.  Looks like chronic HP.  She does endorse exposure to hairspray which may be the allergen.  She has been evaluated by Dr. Amil Amen in the past for positive ANA, Ro antibody and was told that there is no evidence of rheumatologic issues.   With bronchoscope showing lymphocytosis, granuloma on path and possible exposure to hairspray this is high likelihood of being hypersensitivity pneumonitis. Not sure if there is additional underlying CTD associated ILD with  positive serologies  I have asked her to avoid exposure to the hairspray and she has changed the brand. She has not tolerated prednisone.  We will continue monitoring for now.  If worsening on follow-up high-res CT then reconsider treatment plan.   I have a low threshold to start CellCept as there is still a question of connective tissue disease  PFTs and CT from 2022 looks stable and symptomatically she is doing about the same We will get follow-up CT and PFTs in 6 months and return to  clinic  Emphysema PFTs shows mild emphysematous changes.  There is no evidence of obstruction on PFTs.  She is off Trelegy inhaler and supplemental oxygen  GERD She has ongoing GERD and may have reflux contributing to ILD.   Continue prilosec  Plan/Recommendations: - Monitor off steroids - Follow-up HRCT, PFTs in 6 months  Marshell Garfinkel MD [facility] 09/23/2021, 2:51 PM

## 2021-09-23 NOTE — Addendum Note (Signed)
Addended by: Elton Sin on: 09/23/2021 03:04 PM   Modules accepted: Orders

## 2021-09-23 NOTE — Patient Instructions (Signed)
I am glad you are stable with regard to breathing We will get high-res CT and PFTs in 6 months Follow-up in clinic in 6 months after these tests

## 2021-10-21 DIAGNOSIS — H35371 Puckering of macula, right eye: Secondary | ICD-10-CM | POA: Diagnosis not present

## 2021-10-21 DIAGNOSIS — H4323 Crystalline deposits in vitreous body, bilateral: Secondary | ICD-10-CM | POA: Diagnosis not present

## 2021-10-21 DIAGNOSIS — H353211 Exudative age-related macular degeneration, right eye, with active choroidal neovascularization: Secondary | ICD-10-CM | POA: Diagnosis not present

## 2021-10-21 DIAGNOSIS — H353223 Exudative age-related macular degeneration, left eye, with inactive scar: Secondary | ICD-10-CM | POA: Diagnosis not present

## 2021-11-20 DIAGNOSIS — J449 Chronic obstructive pulmonary disease, unspecified: Secondary | ICD-10-CM | POA: Diagnosis not present

## 2021-11-20 DIAGNOSIS — J441 Chronic obstructive pulmonary disease with (acute) exacerbation: Secondary | ICD-10-CM | POA: Diagnosis not present

## 2021-12-02 NOTE — Progress Notes (Signed)
? ? ?Office Visit  ?  ?Patient Name: Cheryl Brown ?Date of Encounter: 12/03/2021 ? ?Primary Care Provider:  [name], MD ?Primary Cardiologist:  Sherren Mocha, MD ?Primary Electrophysiologist: None ?Chief Complaint  ?  ?1 year follow-up for shortness of breath and hypertension ? ? Patient Profile: ?ILD ?COPD ?DM ?HTN ?Palpitations ? ? Recent Studies: ?Lexiscan Myoview 2018: Low risk stress test with normal perfusion ?2D echo 2019: EF 55-60%, mild LVH, grade 1 DD, no RWMA, LA mildly dilated, no significant valvular abnormalities ? ?History of Present Illness  ?  ?Cheryl Brown is a 86 y.o. female with PMH of HTN, HLD, type II DM, COPD, ILD.   Lexiscan Myoview completed 2018 for evaluation of SOB that revealed no significant ischemia and low risk.  Echo completed 2019 with EF of 55-60%, mild LVH, grade 1 DD.  She uses home O2 for management of COPD and is on chronic diuretic therapy for leg swelling.  She is followed by pulmonology for management of COPD and interstitial lung disease. She was last seen by Dr. Burt Knack on 10/2020 and reported no cardiac related problems at that time.  Her blood pressure was controlled on losartan and furosemide. ? ?Since last being seen in the clinic Ms. Tarleton reports that she has been doing well heart wise however she is still having shortness of breath.  She is followed by pulmonologist and states that her inhalers have been stopped due to increased tremors.  On examination today she has +2 pitting edema in her lower extremities that she states is not relieved with Lasix.  Her weight is steady and has only increased by 1 pound since her last office visit.  When discussing her Lasix she states that she is taking 3 pills a day which would be 60 mg, however her dose is 20 mg 1 tablet/day.  In addition to her swelling she also endorses extremely dry mouth that has occurred over the past 2 months.  She states that her water intake is very low because she just does not like to  drink water.  I believe that she may be taking too many Lasix pills that could be causing her dry mouth.  She denies chest pain, palpitations, dyspnea, PND, orthopnea, nausea, vomiting, dizziness, syncope, edema, weight gain, or early satiety. ? ?Past Medical History  ?  ?Past Medical History:  ?Diagnosis Date  ? Blood transfusion   ? COPD (chronic obstructive pulmonary disease) ([place])   ? Diabetes mellitus   ? Dysmetabolic syndrome   ? Hyperlipemia   ? Hypertension   ? IBS (irritable bowel syndrome)   ? Neoplasm of pituitary gland   ? Dr.Nudelman   ? Palpitations   ? Pituitary adenoma ([place]) 1991  ? Dr.Love and Dr.Nudelman   ? Recurrent UTI 2013-14  ? Dr. Thomasene Mohair and Dr. Marvel Plan  ? Reflux esophagitis   ? Restless leg syndrome   ? Scoliosis   ? Spinal Stenosis  ? Shingles outbreak   ? ?Past Surgical History:  ?Procedure Laterality Date  ? ANKLE FRACTURE SURGERY    ? left ankle  ? BREAST EXCISIONAL BIOPSY Bilateral   ? CHOLECYSTECTOMY    ? COLONOSCOPY  2014  ? [facility], [facility]  ? CYSTOSCOPY  2014  ?  Dr Thomasene Mohair  ? ESOPHAGEAL DILATION  01/2003  ? FINGER AMPUTATION    ? related to work injury   ? g2 p2    ? KNEE SURGERY  08/2007   ? complicated by difficult resuscitation  post anesthesia   ? LAPAROSCOPIC CHOLECYSTECTOMY  03/2011  ? [place], [place]  ? PARTIAL HYSTERECTOMY    ? for dysfunctional bleeding   ? pituitary adenoma    ? Dr. Erling Cruz  ? UPPER GI ENDOSCOPY  2014  ? gastric polyp  ? URETHRAL DILATION  2014  ? VIDEO BRONCHOSCOPY Bilateral 06/13/2019  ? Procedure: VIDEO BRONCHOSCOPY WITH FLUORO;  Surgeon: Marshell Garfinkel, MD;  Location: [facility];  Service: Cardiopulmonary;  Laterality: Bilateral;  ? ? ?Allergies ? ?Allergies  ?Allergen Reactions  ? Morphine And Related Shortness Of Breath  ? Bacitracin-Polymyxin B   ?  Other reaction(s): inflammation  ? Codeine Nausea Only  ?  Other reaction(s): nausea/rash/itching  ? Empagliflozin Other (See Comments)  ?  Other reaction(s): rash  ? Ipratropium-Albuterol Other  (See Comments)  ?  VERY NERVOUS  ? Ipratropium-Albuterol Other (See Comments)  ?  VERY NERVOUS  ? Metformin Hcl Other (See Comments)  ?  Other reaction(s): severe abdominal pain and diarrhea  ? Morphine Sulfate   ?  Other reaction(s): shortness of breath  ? Neosporin [Neomycin-Bacitracin Zn-Polymyx] Other (See Comments)  ?  "Had knee surgery and it did not help me heal"  ? Niaspan [Niacin] Other (See Comments)  ?  Abdominal pain  ? Nitrofurantoin Macrocrystal Nausea Only  ? Norvasc [Amlodipine Besylate] Other (See Comments)  ?  10/15/14 headache ?08/27/15 abdominal pain, indigestion  ? Novolin N [Insulin Isophane]   ?  Other reaction(s): itching / rash  ? Pregabalin Swelling  ? Propoxyphene N-Acetaminophen Other (See Comments)  ?  UPSET STOMACH  ? Simvastatin Other (See Comments)  ?  Leg cramps  ? Sulfa Antibiotics   ?  Other reaction(s): Rash/itching  ? Sulfonamide Derivatives Itching  ? Pramipexole Itching and Rash  ?  Patient is actively taking   ? Pramipexole Dihydrochloride Itching and Rash  ?  Patient is actively taking   ? ? ?Home Medications  ?  ?Current Outpatient Medications  ?Medication Sig Dispense Refill  ? albuterol (VENTOLIN HFA) 108 (90 Base) MCG/ACT inhaler Inhale 2 puffs into the lungs every 6 (six) hours as needed.     ? amitriptyline (ELAVIL) 50 MG tablet Take 50 mg by mouth at bedtime.    ? BD PEN NEEDLE NANO U/F 32G X 4 MM MISC use as directed once daily 100 each 10  ? clotrimazole (LOTRIMIN) 1 % cream as needed.    ? estradiol (ESTRACE) 0.1 MG/GM vaginal cream Place 1 g vaginally 3 (three) times a week.    ? furosemide (LASIX) 20 MG tablet Take 1 tablet (20 mg total) by mouth daily. 30 tablet 6  ? glimepiride (AMARYL) 2 MG tablet Take 4 mg by mouth daily.     ? glucose blood (ONE TOUCH ULTRA TEST) test strip 1 each by Other route daily. Use to check blood sugar daily ?Dx: E11.41 90 each 3  ? losartan (COZAAR) 50 MG tablet Take 50 mg by mouth daily.  1  ? omeprazole (PRILOSEC) 20 MG capsule Take  20 mg by mouth daily.    ? ONETOUCH DELICA LANCETS MISC Check blood sugar as directed 100 each 3  ? OXYGEN See admin instructions. Patient take 3 liters at bedtime and at daytime if needed    ? potassium chloride SA (KLOR-CON) 20 MEQ tablet Take 20 mEq by mouth daily.    ? pramipexole (MIRAPEX) 0.5 MG tablet Take 0.5 mg by mouth at bedtime.    ? senna-docusate (SENOKOT-S) 8.6-50 MG  tablet Take 1 tablet by mouth at bedtime as needed for mild constipation or moderate constipation.    ? tobramycin (TOBREX) 0.3 % ophthalmic solution as directed.    ? TOUJEO SOLOSTAR 300 UNIT/ML SOPN Inject 42 Units into the skin every evening.     ? ?No current facility-administered medications for this visit.  ?  ? ?Review of Systems  ?Please see the history of present illness.    ?(+) Dry mouth ?(+) Right lower extremity pain ? ?All other systems reviewed and are otherwise negative except as noted above. ? ?Physical Exam  ?  ?Wt Readings from Last 3 Encounters:  ?12/03/21 203 lb 6.4 oz (92.3 kg)  ?09/23/21 204 lb 9.6 oz (92.8 kg)  ?12/25/20 208 lb (94.3 kg)  ? ?VS: ?Vitals:  ? 12/03/21 1528  ?BP: 110/60  ?Pulse: 73  ?SpO2: 95%  ?,Body mass index is 39.07 kg/m?. ? ?Constitutional:   ?   Appearance: Healthy appearance. Not in distress.  ?Neck:  ?   Vascular: JVD normal.  ?Pulmonary:  ?   Effort: Pulmonary effort is normal.  ?   Breath sounds: No wheezing. No rales. Diminished in the bases ?Cardiovascular:  ?   Normal rate. Regular rhythm. Normal S1. Normal S2.   ?   Murmurs: There is no murmur.  ?Edema: ?   Peripheral edema 2+ pitting.  ?Abdominal:  ?   Palpations: Abdomen is soft non tender. There is no hepatomegaly.  ?Skin: ?   General: Skin is warm and dry mottled in bilateral lower extremities ?Neurological:  ?   General: No focal deficit present.  ?   Mental Status: Alert and oriented to person, place and time.  ?   Cranial Nerves: Cranial nerves are intact.  ?EKG/LABS/Other Studies Reviewed  ?  ?ECG personally reviewed by me today  -sinus rhythm with left axis deviation and incomplete right bundle branch block with rate of 73- no acute changes. ? ?Lab Results  ?Component Value Date  ? WBC 5.6 05/31/2019  ? HGB 14.2 05/31/2019  ?

## 2021-12-03 ENCOUNTER — Ambulatory Visit: Payer: PPO | Admitting: Nurse Practitioner

## 2021-12-03 ENCOUNTER — Encounter: Payer: Self-pay | Admitting: Nurse Practitioner

## 2021-12-03 VITALS — BP 110/60 | HR 73 | Ht 60.5 in | Wt 203.4 lb

## 2021-12-03 DIAGNOSIS — R600 Localized edema: Secondary | ICD-10-CM

## 2021-12-03 DIAGNOSIS — R0602 Shortness of breath: Secondary | ICD-10-CM

## 2021-12-03 DIAGNOSIS — I10 Essential (primary) hypertension: Secondary | ICD-10-CM

## 2021-12-03 NOTE — Patient Instructions (Addendum)
Medication Instructions:  ?Your physician recommends that you continue on your current medications as directed. Please refer to the Current Medication list given to you today.  PLEASE CALL TOMORROW AND SPEAK WITH YOUR NURSE AND LET us KNOW EXACTLY HOW MANY LASIX YOU ARE TAKING BY YOUR BOTTLE   ? ?*If you need a refill on your cardiac medications before your next appointment, please call your pharmacy* ? ? ?Lab Work: ?TODAY:  BMET & PRO BNP ? ?If you have labs (blood work) drawn today and your tests are completely normal, you will receive your results only by: ?MyChart Message (if you have MyChart) OR ?A paper copy in the mail ?If you have any lab test that is abnormal or we need to change your treatment, we will call you to review the results. ? ? ?Testing/Procedures: ?Your physician has requested that you have an echocardiogram. Echocardiography is a painless test that uses sound waves to create images of your heart. It provides your doctor with information about the size and shape of your heart and how well your heart?s chambers and valves are working. This procedure takes approximately one hour. There are no restrictions for this procedure. ? ? ? ?Follow-Up: ?At [facility], you and your health needs are our priority.  As part of our continuing mission to provide you with exceptional heart care, we have created designated Provider Care Teams.  These Care Teams include your primary Cardiologist (physician) and Advanced Practice Providers (APPs -  Physician Assistants and Nurse Practitioners) who all work together to provide you with the care you need, when you need it. ? ?We recommend signing up for the patient portal called "MyChart".  Sign up information is provided on this After Visit Summary.  MyChart is used to connect with patients for Virtual Visits (Telemedicine).  Patients are able to view lab/test results, encounter notes, upcoming appointments, etc.  Non-urgent messages can be sent to your provider  as well.   ?To learn more about what you can do with MyChart, go to [email or web address].   ? ?Your next appointment:   ?3 month(s) ? ?The format for your next appointment:   ?In Person ? ?Provider:   ?Sherren Mocha, MD   ? ? ?Other Instructions ? ? ?Important Information About Sugar ? ? ? ? ?  ?

## 2021-12-04 ENCOUNTER — Telehealth: Payer: Self-pay | Admitting: Nurse Practitioner

## 2021-12-04 ENCOUNTER — Encounter: Payer: Self-pay | Admitting: *Deleted

## 2021-12-04 DIAGNOSIS — R32 Unspecified urinary incontinence: Secondary | ICD-10-CM | POA: Diagnosis not present

## 2021-12-04 DIAGNOSIS — R0902 Hypoxemia: Secondary | ICD-10-CM | POA: Diagnosis not present

## 2021-12-04 DIAGNOSIS — G2581 Restless legs syndrome: Secondary | ICD-10-CM | POA: Diagnosis not present

## 2021-12-04 DIAGNOSIS — Z0000 Encounter for general adult medical examination without abnormal findings: Secondary | ICD-10-CM | POA: Diagnosis not present

## 2021-12-04 DIAGNOSIS — R3915 Urgency of urination: Secondary | ICD-10-CM | POA: Diagnosis not present

## 2021-12-04 DIAGNOSIS — J449 Chronic obstructive pulmonary disease, unspecified: Secondary | ICD-10-CM | POA: Diagnosis not present

## 2021-12-04 DIAGNOSIS — Z79899 Other long term (current) drug therapy: Secondary | ICD-10-CM | POA: Diagnosis not present

## 2021-12-04 DIAGNOSIS — I10 Essential (primary) hypertension: Secondary | ICD-10-CM | POA: Diagnosis not present

## 2021-12-04 DIAGNOSIS — Z1389 Encounter for screening for other disorder: Secondary | ICD-10-CM | POA: Diagnosis not present

## 2021-12-04 DIAGNOSIS — J849 Interstitial pulmonary disease, unspecified: Secondary | ICD-10-CM | POA: Diagnosis not present

## 2021-12-04 DIAGNOSIS — M5416 Radiculopathy, lumbar region: Secondary | ICD-10-CM | POA: Diagnosis not present

## 2021-12-04 DIAGNOSIS — E1140 Type 2 diabetes mellitus with diabetic neuropathy, unspecified: Secondary | ICD-10-CM | POA: Diagnosis not present

## 2021-12-04 DIAGNOSIS — K219 Gastro-esophageal reflux disease without esophagitis: Secondary | ICD-10-CM | POA: Diagnosis not present

## 2021-12-04 DIAGNOSIS — E559 Vitamin D deficiency, unspecified: Secondary | ICD-10-CM | POA: Diagnosis not present

## 2021-12-04 LAB — BASIC METABOLIC PANEL
BUN/Creatinine Ratio: 16 (ref 12–28)
BUN: 12 mg/dL (ref 8–27)
CO2: 26 mmol/L (ref 20–29)
Calcium: 9.2 mg/dL (ref 8.7–10.3)
Chloride: 102 mmol/L (ref 96–106)
Creatinine, Ser: 0.73 mg/dL (ref 0.57–1.00)
Glucose: 177 mg/dL — ABNORMAL HIGH (ref 70–99)
Potassium: 4.4 mmol/L (ref 3.5–5.2)
Sodium: 141 mmol/L (ref 134–144)
eGFR: 82 mL/min/{1.73_m2} (ref >59)

## 2021-12-04 LAB — PRO B NATRIURETIC PEPTIDE: NT-Pro BNP: 129 pg/mL (ref 0–738)

## 2021-12-04 NOTE — Telephone Encounter (Signed)
Patient called and stated that she is taking 3 tablets of furosemide (LASIX) 20 MG tablet everyday. Says that its says take 1 to 3 tablets daily. Patient was asked to give dosage from appt yesterday ?

## 2021-12-04 NOTE — Telephone Encounter (Signed)
Reviewed w/ Ambrose Pancoast, NP. Pt advised to decrease Lasix back down  to one a day. Advised to call office if she starts to notice weight gain, edema, sob. Patient verbalized understanding and agreeable to plan.

## 2021-12-09 ENCOUNTER — Telehealth: Payer: Self-pay

## 2021-12-09 NOTE — Telephone Encounter (Signed)
-----   Message from Marylu Lund., NP sent at 12/04/2021  9:02 AM EDT ----- ?Good morning, ? ?Please let Cheryl Brown know that her renal function and electrolytes were all within normal limits.  Her BNP indicates that she is not currently having any exacerbation of her heart failure therefore she can reduce her Lasix to 20 mg/day.  Please have her call if she notices increased lower extremity edema and her weight goes up more than 3 pounds in a day or 5 pounds in a week. ? ?Thank you, ? ?Ambrose Pancoast, NP ?

## 2021-12-09 NOTE — Telephone Encounter (Signed)
Patient notified and verbalized understanding. Pt had no questions or concerns at this time. PCP copied.  ?

## 2021-12-16 DIAGNOSIS — H4323 Crystalline deposits in vitreous body, bilateral: Secondary | ICD-10-CM | POA: Diagnosis not present

## 2021-12-16 DIAGNOSIS — H353223 Exudative age-related macular degeneration, left eye, with inactive scar: Secondary | ICD-10-CM | POA: Diagnosis not present

## 2021-12-16 DIAGNOSIS — H35371 Puckering of macula, right eye: Secondary | ICD-10-CM | POA: Diagnosis not present

## 2021-12-16 DIAGNOSIS — H353211 Exudative age-related macular degeneration, right eye, with active choroidal neovascularization: Secondary | ICD-10-CM | POA: Diagnosis not present

## 2021-12-18 ENCOUNTER — Ambulatory Visit (HOSPITAL_COMMUNITY): Payer: PPO | Attending: Cardiology

## 2021-12-18 DIAGNOSIS — I10 Essential (primary) hypertension: Secondary | ICD-10-CM

## 2021-12-18 DIAGNOSIS — R0602 Shortness of breath: Secondary | ICD-10-CM | POA: Diagnosis not present

## 2021-12-18 LAB — ECHOCARDIOGRAM COMPLETE
Area-P 1/2: 2.71 cm2
S' Lateral: 2.8 cm

## 2021-12-18 NOTE — Progress Notes (Unsigned)
[id number] ?

## 2021-12-19 ENCOUNTER — Telehealth: Payer: Self-pay

## 2021-12-19 NOTE — Telephone Encounter (Signed)
-----   Message from Marylu Lund., NP sent at 12/19/2021  9:51 AM EDT ----- ?Good Morning, ? ?Please let Ms. Klee know that her echo shows normal heart pumping function with some stiffness of the heart that is unchanged from your previous echo.There is no abnormal leaks or narrowing of your heart valves. Overall no changes form 2019 echo.Please let me know if you have any further questions. ? ?Thank you, ? ?Ambrose Pancoast, NP ?

## 2021-12-19 NOTE — Telephone Encounter (Signed)
Spoke with pt and advised of comments per Cheryl Pancoast, NP as  below. ?  ?echo shows normal heart pumping function with some stiffness of the heart that is unchanged from your previous echo.There is no abnormal leaks or narrowing of your heart valves. Overall no changes form 2019 echo.Please let me know if you have any further questions. ? Thank you, ? Cheryl Pancoast, NP ? ?Pt verbalizes understanding and thanked Therapist, sports for the phone call. ?

## 2021-12-25 DIAGNOSIS — J439 Emphysema, unspecified: Secondary | ICD-10-CM | POA: Diagnosis not present

## 2021-12-25 DIAGNOSIS — I10 Essential (primary) hypertension: Secondary | ICD-10-CM | POA: Diagnosis not present

## 2021-12-25 DIAGNOSIS — Z6836 Body mass index (BMI) 36.0-36.9, adult: Secondary | ICD-10-CM | POA: Diagnosis not present

## 2022-02-02 DIAGNOSIS — J449 Chronic obstructive pulmonary disease, unspecified: Secondary | ICD-10-CM | POA: Diagnosis not present

## 2022-02-02 DIAGNOSIS — E11649 Type 2 diabetes mellitus with hypoglycemia without coma: Secondary | ICD-10-CM | POA: Diagnosis not present

## 2022-02-02 DIAGNOSIS — E1140 Type 2 diabetes mellitus with diabetic neuropathy, unspecified: Secondary | ICD-10-CM | POA: Diagnosis not present

## 2022-02-02 DIAGNOSIS — I10 Essential (primary) hypertension: Secondary | ICD-10-CM | POA: Diagnosis not present

## 2022-02-02 DIAGNOSIS — K219 Gastro-esophageal reflux disease without esophagitis: Secondary | ICD-10-CM | POA: Diagnosis not present

## 2022-02-11 DIAGNOSIS — H35371 Puckering of macula, right eye: Secondary | ICD-10-CM | POA: Diagnosis not present

## 2022-02-11 DIAGNOSIS — H4323 Crystalline deposits in vitreous body, bilateral: Secondary | ICD-10-CM | POA: Diagnosis not present

## 2022-02-11 DIAGNOSIS — H353223 Exudative age-related macular degeneration, left eye, with inactive scar: Secondary | ICD-10-CM | POA: Diagnosis not present

## 2022-02-11 DIAGNOSIS — H353211 Exudative age-related macular degeneration, right eye, with active choroidal neovascularization: Secondary | ICD-10-CM | POA: Diagnosis not present

## 2022-02-23 DIAGNOSIS — I10 Essential (primary) hypertension: Secondary | ICD-10-CM | POA: Diagnosis not present

## 2022-02-23 DIAGNOSIS — E1140 Type 2 diabetes mellitus with diabetic neuropathy, unspecified: Secondary | ICD-10-CM | POA: Diagnosis not present

## 2022-02-23 DIAGNOSIS — J449 Chronic obstructive pulmonary disease, unspecified: Secondary | ICD-10-CM | POA: Diagnosis not present

## 2022-02-23 DIAGNOSIS — K219 Gastro-esophageal reflux disease without esophagitis: Secondary | ICD-10-CM | POA: Diagnosis not present

## 2022-02-23 DIAGNOSIS — E11649 Type 2 diabetes mellitus with hypoglycemia without coma: Secondary | ICD-10-CM | POA: Diagnosis not present

## 2022-03-11 ENCOUNTER — Inpatient Hospital Stay: Admission: RE | Admit: 2022-03-11 | Payer: PPO | Source: Ambulatory Visit

## 2022-03-20 DIAGNOSIS — I10 Essential (primary) hypertension: Secondary | ICD-10-CM | POA: Diagnosis not present

## 2022-03-20 DIAGNOSIS — K219 Gastro-esophageal reflux disease without esophagitis: Secondary | ICD-10-CM | POA: Diagnosis not present

## 2022-03-20 DIAGNOSIS — J449 Chronic obstructive pulmonary disease, unspecified: Secondary | ICD-10-CM | POA: Diagnosis not present

## 2022-03-20 DIAGNOSIS — E1140 Type 2 diabetes mellitus with diabetic neuropathy, unspecified: Secondary | ICD-10-CM | POA: Diagnosis not present

## 2022-04-07 DIAGNOSIS — I10 Essential (primary) hypertension: Secondary | ICD-10-CM | POA: Diagnosis not present

## 2022-04-07 DIAGNOSIS — R0902 Hypoxemia: Secondary | ICD-10-CM | POA: Diagnosis not present

## 2022-04-07 DIAGNOSIS — K219 Gastro-esophageal reflux disease without esophagitis: Secondary | ICD-10-CM | POA: Diagnosis not present

## 2022-04-07 DIAGNOSIS — R3915 Urgency of urination: Secondary | ICD-10-CM | POA: Diagnosis not present

## 2022-04-07 DIAGNOSIS — M5416 Radiculopathy, lumbar region: Secondary | ICD-10-CM | POA: Diagnosis not present

## 2022-04-07 DIAGNOSIS — J849 Interstitial pulmonary disease, unspecified: Secondary | ICD-10-CM | POA: Diagnosis not present

## 2022-04-07 DIAGNOSIS — G2581 Restless legs syndrome: Secondary | ICD-10-CM | POA: Diagnosis not present

## 2022-04-07 DIAGNOSIS — E1140 Type 2 diabetes mellitus with diabetic neuropathy, unspecified: Secondary | ICD-10-CM | POA: Diagnosis not present

## 2022-04-07 DIAGNOSIS — E559 Vitamin D deficiency, unspecified: Secondary | ICD-10-CM | POA: Diagnosis not present

## 2022-04-07 DIAGNOSIS — R32 Unspecified urinary incontinence: Secondary | ICD-10-CM | POA: Diagnosis not present

## 2022-04-07 DIAGNOSIS — L84 Corns and callosities: Secondary | ICD-10-CM | POA: Diagnosis not present

## 2022-04-07 DIAGNOSIS — J449 Chronic obstructive pulmonary disease, unspecified: Secondary | ICD-10-CM | POA: Diagnosis not present

## 2022-04-15 DIAGNOSIS — H353211 Exudative age-related macular degeneration, right eye, with active choroidal neovascularization: Secondary | ICD-10-CM | POA: Diagnosis not present

## 2022-04-15 DIAGNOSIS — H43811 Vitreous degeneration, right eye: Secondary | ICD-10-CM | POA: Diagnosis not present

## 2022-04-15 DIAGNOSIS — H43822 Vitreomacular adhesion, left eye: Secondary | ICD-10-CM | POA: Diagnosis not present

## 2022-04-15 DIAGNOSIS — H4323 Crystalline deposits in vitreous body, bilateral: Secondary | ICD-10-CM | POA: Diagnosis not present

## 2022-04-15 DIAGNOSIS — H353223 Exudative age-related macular degeneration, left eye, with inactive scar: Secondary | ICD-10-CM | POA: Diagnosis not present

## 2022-04-16 DIAGNOSIS — Z7984 Long term (current) use of oral hypoglycemic drugs: Secondary | ICD-10-CM | POA: Diagnosis not present

## 2022-04-16 DIAGNOSIS — I10 Essential (primary) hypertension: Secondary | ICD-10-CM | POA: Diagnosis not present

## 2022-04-16 DIAGNOSIS — Z87891 Personal history of nicotine dependence: Secondary | ICD-10-CM | POA: Diagnosis not present

## 2022-04-16 DIAGNOSIS — E1165 Type 2 diabetes mellitus with hyperglycemia: Secondary | ICD-10-CM | POA: Diagnosis not present

## 2022-04-16 DIAGNOSIS — K219 Gastro-esophageal reflux disease without esophagitis: Secondary | ICD-10-CM | POA: Diagnosis not present

## 2022-04-16 DIAGNOSIS — Z7985 Long-term (current) use of injectable non-insulin antidiabetic drugs: Secondary | ICD-10-CM | POA: Diagnosis not present

## 2022-04-29 ENCOUNTER — Ambulatory Visit (INDEPENDENT_AMBULATORY_CARE_PROVIDER_SITE_OTHER): Payer: PPO | Admitting: Pulmonary Disease

## 2022-04-29 ENCOUNTER — Encounter: Payer: Self-pay | Admitting: Pulmonary Disease

## 2022-04-29 VITALS — BP 120/60 | HR 70 | Temp 97.8°F | Ht 62.0 in | Wt 203.0 lb

## 2022-04-29 DIAGNOSIS — J849 Interstitial pulmonary disease, unspecified: Secondary | ICD-10-CM | POA: Diagnosis not present

## 2022-04-29 LAB — PULMONARY FUNCTION TEST
DL/VA % pred: 76 %
DL/VA: 3.13 ml/min/mmHg/L
DLCO cor % pred: 58 %
DLCO cor: 10.3 ml/min/mmHg
DLCO unc % pred: 58 %
DLCO unc: 10.3 ml/min/mmHg
FEF 25-75 Post: 1.8 L/sec
FEF 25-75 Pre: 1.65 L/sec
FEF2575-%Change-Post: 9 %
FEF2575-%Pred-Post: 153 %
FEF2575-%Pred-Pre: 141 %
FEV1-%Change-Post: 3 %
FEV1-%Pred-Post: 88 %
FEV1-%Pred-Pre: 85 %
FEV1-Post: 1.5 L
FEV1-Pre: 1.45 L
FEV1FVC-%Change-Post: 1 %
FEV1FVC-%Pred-Pre: 114 %
FEV6-%Change-Post: 2 %
FEV6-%Pred-Post: 82 %
FEV6-%Pred-Pre: 80 %
FEV6-Post: 1.77 L
FEV6-Pre: 1.73 L
FEV6FVC-%Pred-Post: 106 %
FEV6FVC-%Pred-Pre: 106 %
FVC-%Change-Post: 2 %
FVC-%Pred-Post: 77 %
FVC-%Pred-Pre: 75 %
FVC-Post: 1.77 L
FVC-Pre: 1.74 L
Post FEV1/FVC ratio: 85 %
Post FEV6/FVC ratio: 100 %
Pre FEV1/FVC ratio: 84 %
Pre FEV6/FVC Ratio: 100 %
RV % pred: 69 %
RV: 1.65 L
TLC % pred: 75 %
TLC: 3.62 L

## 2022-04-29 NOTE — Progress Notes (Signed)
Cheryl Brown    [id number]    Aug 05, 1936  Primary Care Physician:Gates, [name], MD  Referring Physician: [name], MD [address] [place],  [place] [address]  Chief complaint: Follow up for emphysema, interstitial lung disease.  HPI: 86 y.o. with history of hypertension, diabetes, COPD, OSA (noncompliant with CPAP].   She was diagnosed with COPD in 2012.  Has dyspnea with rest, chronic cough with white mucus, wheezing, occasional fevers and chills at night.  She does not have seasonal allergies. + for acid reflux and indigestion.   Diagnosed with sleep apnea.  She has been intolerant of CPAP and is not interested in trying again. History noted for elevated ANA with positive ro antibody.  She was referred to Dr. Amil Amen in August 2018 but was told she did not have any rheumatologic issue.  She continues to have dry mouth, dry eyes.  Denies any joint pain, rash.  Office note from Dr. Amil Amen, rheumatology dated 05/20/17 Evaluated for positive ANA, SSA. Thought to be false-positive in spite of having xerostomia.  No systemic immunosuppression reccomended as there are no extraglandular manifestation.  Underwent bronchoscopy on 06/13/2019 and discussion at multidisciplinary conference on 06/20/2019 with diagnosis of hypersensitivity pneumonitis with moderate confidence. She was started on prednisone on 06/28/19 at 40 mg which was then tapered to 20 mg in 07/26/2019. Came off the prednisone in January 2020.  She did not like being on the steroids which caused weight gain and she cannot tell if it made any difference with her breathing  Diagnosed with COVID-19 in January 2020.  She did not need admission and self isolated at home  Pets: No pets Occupation: Retired from [employer] in 1979 Exposures: No known exposures, no mold, hot tub. ILD questionnaire-Negative for exposures. Smoking history: 30-pack-year smoking history.  Quit in 1988 Travel History:  Lived in [place] all her life.  No significant travel history.  Interim history: She is stable with regard to breathing.  Has chronic dyspnea on exertion without change She had a CT scan ordered for last month but could not be scheduled as she did not answer the phone and voicemail was full Here for review of PFTs.  Outpatient Encounter Medications as of 04/29/2022  Medication Sig   albuterol (VENTOLIN HFA) 108 (90 Base) MCG/ACT inhaler Inhale 2 puffs into the lungs every 6 (six) hours as needed.    amitriptyline (ELAVIL) 50 MG tablet Take 50 mg by mouth at bedtime.   BD PEN NEEDLE NANO U/F 32G X 4 MM MISC use as directed once daily   clotrimazole (LOTRIMIN) 1 % cream as needed.   estradiol (ESTRACE) 0.1 MG/GM vaginal cream Place 1 g vaginally 3 (three) times a week.   furosemide (LASIX) 20 MG tablet Take 1 tablet (20 mg total) by mouth daily.   glimepiride (AMARYL) 2 MG tablet Take 4 mg by mouth daily.    glucose blood (ONE TOUCH ULTRA TEST) test strip 1 each by Other route daily. Use to check blood sugar daily Dx: E11.41   losartan (COZAAR) 50 MG tablet Take 50 mg by mouth daily.   omeprazole (PRILOSEC) 20 MG capsule Take 20 mg by mouth daily.   ONETOUCH DELICA LANCETS MISC Check blood sugar as directed   OXYGEN See admin instructions. Patient take 3 liters at bedtime and at daytime if needed   potassium chloride SA (KLOR-CON) 20 MEQ tablet Take 20 mEq by mouth daily.   pramipexole (MIRAPEX)  0.5 MG tablet Take 0.5 mg by mouth at bedtime.   senna-docusate (SENOKOT-S) 8.6-50 MG tablet Take 1 tablet by mouth at bedtime as needed for mild constipation or moderate constipation.   tobramycin (TOBREX) 0.3 % ophthalmic solution as directed.   TOUJEO SOLOSTAR 300 UNIT/ML SOPN Inject 42 Units into the skin every evening.    TRULICITY 1.5 ST/4.1DQ SOPN Inject 1.5 mg into the skin once a week.   No facility-administered encounter medications on file as of 04/29/2022.   Physical Exam: Blood pressure  120/60, pulse 70, temperature 97.8 F (36.6 C), temperature source Oral, height '5\' 2"'$  (1.575 m), weight 203 lb (92.1 kg), SpO2 94 %. Gen:      No acute distress HEENT:  EOMI, sclera anicteric Neck:     No masses; no thyromegaly Lungs:    Clear to auscultation bilaterally; normal respiratory effort CV:         Regular rate and rhythm; no murmurs Abd:      + bowel sounds; soft, non-tender; no palpable masses, no distension Ext:    No edema; adequate peripheral perfusion Skin:      Warm and dry; no rash Neuro: alert and oriented x 3 Psych: normal mood and affect   Data Reviewed: Imaging CT abdomen pelvis 08/28/16-bibasilar opacities, atelectasis Chest x-ray 08/28/16-coarse interstitial marking, nodular opacity in the left midlung region. Chest x-ray 09/10/17- stable coarse interstitial markings. High-resolution CT chest 09/15/17- bronchial wall thickening with mild emphysema.  Diffuse groundglass attenuation, septal thickening, bronchiectasis with moderate air trapping.  High-resolution CT 04/14/18-mild emphysema.  Stable findings of groundglass opacities, septal thickening, bronchiectasis with air-trapping.  Indeterminate for UIP.  Left main and three-vessel coronary artery disease. High-resolution CT 05/19/2019-stable findings of interstitial lung disease as before.  Possible chronic HP.  With slight progression compared to 2019 CT high-resolution 12/27/2019-stable interstitial lung disease. I have reviewed the images personally.  PFTs  09/21/17 FVC 1.96 [79%], FEV1 1.64 [90%], F/F 84, TLC 73%, DLCO 12.43 [55%]   04/22/2018 FVC 2.0 [82%], FEV1 1.73 [96%], F/F 87, TLC 74%, DLCO 12.04 [54%]  01/23/2019 FVC 1.95 [80%], FEV1 1.56 [86%],/F 80, DLCO 10.92 [61%]  11/21/2019 FVC 1.77 [1 4%), FEV1 1.46 [82%], F/F 82, DLCO 9.11 [62%  12/13/2020 FVC 1.88 [80%], FEV1 1.55 [9%], F/F 83, TLC 3.18 [64%], DLCO 11.46 [64%]  04/29/22 FVC 1.77 [7 7%], FEV1 1.50 [88%], F/F 85, TLC 3.62 [75%], DLCO 10.30  [58%] Mild diffusion defect, mild restriction  FENO 09/10/17- 9  6-minute walk test 11/02/17-  Starting heart rate, sats-69, 93% Starting heart rate, sats-108, 94% Distance walked 408,  Patient stopped with 30 seconds left due to SOB and leg cramps.  6-minute walk 04/08/2020-408 m  Labs: 04/05/17-positive ANA, Ro antibody 1.4  Repeat labs 09/21/17 Blood allergy profile-negative, IgE 4 ILD panel-ANA negative, angiotensin-converting enzyme-86 Rheumatoid factor, CCP-negative Ro antibody 1.1 La negative Hypersensitivity panel-negative  Cardiac Echocardiogram 09/24/17- Normal LV size with mild LV hypertrophy. EF 55-60%. Normal RV size and systolic function. No significant valvular abnormalities.   PA peak pressure 31  Cardiac stress test 09/24/2016- low risk study, EF 55-65%.  Bronchoscopy 06/13/2019 Cell count nucleated cells 213, 30% lymphs, 18% eos, 22% neutrophils, 30% monocyte macrophage Cultures -normal respiratory flora Transbronchial biopsy-isolated nonnecrotizing microgranuloma  Assessment:  Interstitial lung disease CT scan reviewed which shows nonspecific interstitial lung disease with air trapping.  Looks like chronic HP.  She does endorse exposure to hairspray which may be the allergen.  She has been evaluated by Dr. Amil Amen  in the past for positive ANA, Ro antibody and was told that there is no evidence of rheumatologic issues.   With bronchoscope showing lymphocytosis, granuloma on path and possible exposure to hairspray this is high likelihood of being hypersensitivity pneumonitis. Not sure if there is additional underlying CTD associated ILD with positive serologies  I have asked her to avoid exposure to the hairspray and she has changed the brand. She has not tolerated prednisone.  We will continue monitoring for now.  If worsening on follow-up high-res CT then reconsider treatment plan.   I have a low threshold to start CellCept as there is still a question of  connective tissue disease  PFTs reviewed today which have been largely stable over the years.  She did not get her CT scan as planned and we will reorder it.  Follow-up in 6 months.  Emphysema PFTs shows mild emphysematous changes.  There is no evidence of obstruction on PFTs.  She is off Trelegy inhaler and continues supplemental oxygen at night.  GERD She has ongoing GERD and may have reflux contributing to ILD.   Continue prilosec  Plan/Recommendations: - Monitor off steroids - Follow-up HRCT in 6 months  Marshell Garfinkel MD [facility] 04/29/2022, 11:00 AM

## 2022-04-29 NOTE — Progress Notes (Signed)
PFT done today. 

## 2022-04-29 NOTE — Patient Instructions (Signed)
I am glad you are stable with regard to your breathing  Your lung function tests are stable  We will order a follow-up high-resolution CT in 6 months and return to clinic in 6 months.

## 2022-05-04 DIAGNOSIS — H353211 Exudative age-related macular degeneration, right eye, with active choroidal neovascularization: Secondary | ICD-10-CM | POA: Diagnosis not present

## 2022-05-04 DIAGNOSIS — H43391 Other vitreous opacities, right eye: Secondary | ICD-10-CM | POA: Diagnosis not present

## 2022-05-04 DIAGNOSIS — H26491 Other secondary cataract, right eye: Secondary | ICD-10-CM | POA: Diagnosis not present

## 2022-05-12 DIAGNOSIS — H353223 Exudative age-related macular degeneration, left eye, with inactive scar: Secondary | ICD-10-CM | POA: Diagnosis not present

## 2022-05-12 DIAGNOSIS — H353211 Exudative age-related macular degeneration, right eye, with active choroidal neovascularization: Secondary | ICD-10-CM | POA: Diagnosis not present

## 2022-05-19 DIAGNOSIS — I10 Essential (primary) hypertension: Secondary | ICD-10-CM | POA: Diagnosis not present

## 2022-05-19 DIAGNOSIS — E1140 Type 2 diabetes mellitus with diabetic neuropathy, unspecified: Secondary | ICD-10-CM | POA: Diagnosis not present

## 2022-05-19 DIAGNOSIS — J449 Chronic obstructive pulmonary disease, unspecified: Secondary | ICD-10-CM | POA: Diagnosis not present

## 2022-05-19 DIAGNOSIS — K219 Gastro-esophageal reflux disease without esophagitis: Secondary | ICD-10-CM | POA: Diagnosis not present

## 2022-06-02 DIAGNOSIS — H353211 Exudative age-related macular degeneration, right eye, with active choroidal neovascularization: Secondary | ICD-10-CM | POA: Diagnosis not present

## 2022-06-02 DIAGNOSIS — H353223 Exudative age-related macular degeneration, left eye, with inactive scar: Secondary | ICD-10-CM | POA: Diagnosis not present

## 2022-06-10 ENCOUNTER — Ambulatory Visit: Payer: PPO | Attending: Cardiovascular Disease | Admitting: Cardiovascular Disease

## 2022-06-10 ENCOUNTER — Encounter: Payer: Self-pay | Admitting: Cardiovascular Disease

## 2022-06-10 VITALS — BP 130/84 | HR 71 | Ht 62.5 in | Wt 199.0 lb

## 2022-06-10 DIAGNOSIS — E782 Mixed hyperlipidemia: Secondary | ICD-10-CM | POA: Diagnosis not present

## 2022-06-10 DIAGNOSIS — E119 Type 2 diabetes mellitus without complications: Secondary | ICD-10-CM | POA: Diagnosis not present

## 2022-06-10 DIAGNOSIS — R0602 Shortness of breath: Secondary | ICD-10-CM | POA: Diagnosis not present

## 2022-06-10 DIAGNOSIS — I10 Essential (primary) hypertension: Secondary | ICD-10-CM | POA: Diagnosis not present

## 2022-06-10 NOTE — Progress Notes (Unsigned)
Cardiology Office Note:    Date:  06/11/2022   ID:  Cheryl Brown, DOB 04-19-1936, MRN [id number]  PCP:  [name], [place] Providers Cardiologist:  Sherren Mocha, MD     Referring MD: [name], MD   Chief Complaint  Patient presents with   Shortness of Breath    History of Present Illness:    Cheryl Brown is a 86 y.o. female presenting for follow-up of hypertension, hyperlipidemia, and chronic dyspnea.  The patient has undergone echo assessment demonstrating normal LVEF with grade 1 diastolic dysfunction, normal pulmonary artery pressures, and no significant valvular disease.  Her most recent echo was just a few months ago.  She had a nuclear scan in 2019 demonstrating normal myocardial perfusion.  The patient is here alone today.  She continues to be limited by shortness of breath.  The patient is followed now for pulmonary fibrosis and she is O2 dependent.  She states that she generally does not wear oxygen during the day because even humidified oxygen dries her out so badly.  She does sleep with oxygen in place.  States that her O2 levels at rest are generally in the 90 to 92% range.  She denies chest pain or pressure.  She denies orthopnea or PND.  No lightheadedness or syncope.  Past Medical History:  Diagnosis Date   Blood transfusion    COPD (chronic obstructive pulmonary disease) (HCC)    Diabetes mellitus    Dysmetabolic syndrome    Hyperlipemia    Hypertension    IBS (irritable bowel syndrome)    Neoplasm of pituitary gland    Dr.Nudelman    Palpitations    Pituitary adenoma ([place]) 1991   Dr.Love and Dr.Nudelman    Recurrent UTI 2013-14   Dr. Thomasene Mohair and Dr. Marvel Plan   Reflux esophagitis    Restless leg syndrome    Scoliosis    Spinal Stenosis   Shingles outbreak     Past Surgical History:  Procedure Laterality Date   ANKLE FRACTURE SURGERY     left ankle   BREAST EXCISIONAL BIOPSY Bilateral    CHOLECYSTECTOMY      COLONOSCOPY  2014   [facility], [facility]   CYSTOSCOPY  2014    Dr Thomasene Mohair   ESOPHAGEAL DILATION  01/2003   FINGER AMPUTATION     related to work injury    g2 p2     KNEE SURGERY  04/6437    complicated by difficult resuscitation post anesthesia    LAPAROSCOPIC CHOLECYSTECTOMY  03/2011   [facility], [place]     for dysfunctional bleeding    pituitary adenoma     Dr. Erling Cruz   UPPER GI ENDOSCOPY  2014   gastric polyp   URETHRAL DILATION  2014   VIDEO BRONCHOSCOPY Bilateral 06/13/2019   Procedure: VIDEO BRONCHOSCOPY WITH FLUORO;  Surgeon: Marshell Garfinkel, MD;  Location: [place];  Service: Cardiopulmonary;  Laterality: Bilateral;    Current Medications: Current Meds  Medication Sig   albuterol (VENTOLIN HFA) 108 (90 Base) MCG/ACT inhaler Inhale 2 puffs into the lungs every 6 (six) hours as needed.    amitriptyline (ELAVIL) 50 MG tablet Take 50 mg by mouth at bedtime.   atorvastatin (LIPITOR) 20 MG tablet Take 20 mg by mouth daily.   BD PEN NEEDLE NANO U/F 32G X 4 MM MISC use as directed once daily   betamethasone valerate (VALISONE) 0.1 % cream APPLY A THIN LAYER TO  AFFECTED AREA(S) TWICE A DAY FOR 2 WEEKS THEN USE 2 TO 3 TIMES A WEEK   clotrimazole (LOTRIMIN) 1 % cream as needed.   estradiol (ESTRACE) 0.1 MG/GM vaginal cream Place 1 g vaginally 3 (three) times a week.   furosemide (LASIX) 20 MG tablet Take 1 tablet (20 mg total) by mouth daily.   glimepiride (AMARYL) 2 MG tablet Take 4 mg by mouth daily.    glucose blood (ONE TOUCH ULTRA TEST) test strip 1 each by Other route daily. Use to check blood sugar daily Dx: E11.41   losartan (COZAAR) 50 MG tablet Take 50 mg by mouth daily.   omeprazole (PRILOSEC) 20 MG capsule Take 20 mg by mouth daily.   ONETOUCH DELICA LANCETS MISC Check blood sugar as directed   OXYGEN See admin instructions. Patient take 3 liters at bedtime and at daytime if needed   potassium chloride SA (KLOR-CON) 20 MEQ tablet Take 20 mEq by  mouth daily.   pramipexole (MIRAPEX) 0.5 MG tablet Take 0.5 mg by mouth at bedtime.   senna-docusate (SENOKOT-S) 8.6-50 MG tablet Take 1 tablet by mouth at bedtime as needed for mild constipation or moderate constipation.   tobramycin (TOBREX) 0.3 % ophthalmic solution as directed.   TOUJEO SOLOSTAR 300 UNIT/ML SOPN Inject 42 Units into the skin every evening.      Allergies:   Morphine and related, Bacitracin-polymyxin b, Codeine, Empagliflozin, Ipratropium-albuterol, Ipratropium-albuterol, Metformin hcl, Morphine sulfate, Neosporin [neomycin-bacitracin zn-polymyx], Niaspan [niacin], Nitrofurantoin macrocrystal, Norvasc [amlodipine besylate], Novolin n [insulin isophane], Pregabalin, Propoxyphene n-acetaminophen, Simvastatin, Sulfa antibiotics, Sulfonamide derivatives, Pramipexole, and Pramipexole dihydrochloride   Social History   Socioeconomic History   Marital status: Married    Spouse name: Not on file   Number of children: 2   Years of education: 9th   Highest education level: Not on file  Occupational History   Occupation: Retired  Tobacco Use   Smoking status: Former    Packs/day: 0.50    Years: 25.00    Total pack years: 12.50    Types: Cigarettes    Quit date: 08/10/1978    Years since quitting: 43.8   Smokeless tobacco: Never  Vaping Use   Vaping Use: Never used  Substance and Sexual Activity   Alcohol use: No   Drug use: No   Sexual activity: Not on file  Other Topics Concern   Not on file  Social History Narrative   Lives at home with her husband.   Right-handed.   No caffeine use.   Social Determinants of Health   Financial Resource Strain: Not on file  Food Insecurity: Not on file  Transportation Needs: Not on file  Physical Activity: Not on file  Stress: Not on file  Social Connections: Not on file     Family History: The patient's family history includes Alzheimer's disease in her maternal aunt, mother, and sister; Arthritis in her mother; Bone  cancer in her father; Breast cancer (age of onset: 26) in her sister; Cancer in her father; Coronary artery disease in her father; Diabetes in her father and sister; Hypertension in her sister; Mental illness in her maternal aunt, maternal grandmother, and mother; Mitral valve prolapse in her sister; Osteoporosis in her sister; Prostate cancer in her father; Stroke in her paternal grandmother.  ROS:   Please see the history of present illness.    All other systems reviewed and are negative.  EKGs/Labs/Other Studies Reviewed:    The following studies were reviewed today: Echo 12/18/2021:  1.  Left ventricular ejection fraction, by estimation, is 55%. The left  ventricle has normal function. The left ventricle has no regional wall  motion abnormalities. Left ventricular diastolic parameters are consistent  with Grade I diastolic dysfunction  (impaired relaxation).   2. Right ventricular systolic function is normal. The right ventricular  size is normal. There is normal pulmonary artery systolic pressure. The  estimated right ventricular systolic pressure is 76.7 mmHg.   3. Left atrial size was mildly dilated.   4. The mitral valve is normal in structure. Trivial mitral valve  regurgitation. No evidence of mitral stenosis.   5. The aortic valve is tricuspid. There is mild calcification of the  aortic valve. Aortic valve regurgitation is not visualized. No aortic  stenosis is present.   6. The inferior vena cava is normal in size with greater than 50%  respiratory variability, suggesting right atrial pressure of 3 mmHg.   EKG:  EKG is not ordered today.    Recent Labs: 12/03/2021: BUN 12; Creatinine, Ser 0.73; NT-Pro BNP 129; Potassium 4.4; Sodium 141  Recent Lipid Panel    Component Value Date/Time   CHOL 196 05/28/2015 1416   TRIG 293.0 (H) 05/28/2015 1416   HDL 47.50 05/28/2015 1416   CHOLHDL 4 05/28/2015 1416   VLDL 58.6 (H) 05/28/2015 1416   LDLCALC 132 (H) 11/16/2013 1418    LDLDIRECT 122.0 05/28/2015 1416     Risk Assessment/Calculations:                Physical Exam:    VS:  BP 130/84   Pulse 71   Ht 5' 2.5" (1.588 m)   Wt 199 lb (90.3 kg)   SpO2 91%   BMI 35.82 kg/m     Wt Readings from Last 3 Encounters:  06/10/22 199 lb (90.3 kg)  04/29/22 203 lb (92.1 kg)  12/03/21 203 lb 6.4 oz (92.3 kg)     GEN:  Well nourished, well developed in no acute distress HEENT: Normal NECK: No JVD; No carotid bruits LYMPHATICS: No lymphadenopathy CARDIAC: RRR, no murmurs, rubs, gallops RESPIRATORY:  Clear to auscultation without rales, wheezing or rhonchi  ABDOMEN: Soft, non-tender, non-distended MUSCULOSKELETAL:  1+ pretibial edema; No deformity  SKIN: Warm and dry NEUROLOGIC:  Alert and oriented x 3 PSYCHIATRIC:  Normal affect   ASSESSMENT:    1. Shortness of breath   2. Essential hypertension   3. Type 2 diabetes mellitus without complication, without long-term current use of insulin ([place])   4. Mixed hyperlipidemia    PLAN:    In order of problems listed above:  Essentially unchanged, primarily related to underlying pulmonary disease.  Most recent echo reviewed as above.  No evidence of pulmonary hypertension. Blood pressure is controlled on losartan.  No changes recommended.  Continue low-dose furosemide. Managed by her PCP.  Most recent hemoglobin A1c is 7.8. LDL cholesterol 76 on atorvastatin 20 mg daily.  The patient appears stable from a cardiovascular perspective.  I will plan to see her back in 1 year unless problems arise.    Medication Adjustments/Labs and Tests Ordered: Current medicines are reviewed at length with the patient today.  Concerns regarding medicines are outlined above.  No orders of the defined types were placed in this encounter.  No orders of the defined types were placed in this encounter.   Patient Instructions  Medication Instructions:  Your physician recommends that you continue on your current  medications as directed. Please refer to the Current Medication list given  to you today.  *If you need a refill on your cardiac medications before your next appointment, please call your pharmacy*   Lab Work: NONE If you have labs (blood work) drawn today and your tests are completely normal, you will receive your results only by: [place] (if you have MyChart) OR A paper copy in the mail If you have any lab test that is abnormal or we need to change your treatment, we will call you to review the results.   Testing/Procedures: NONE   Follow-Up: At [facility], you and your health needs are our priority.  As part of our continuing mission to provide you with exceptional heart care, we have created designated Provider Care Teams.  These Care Teams include your primary Cardiologist (physician) and Advanced Practice Providers (APPs -  Physician Assistants and Nurse Practitioners) who all work together to provide you with the care you need, when you need it.  We recommend signing up for the patient portal called "MyChart".  Sign up information is provided on this After Visit Summary.  MyChart is used to connect with patients for Virtual Visits (Telemedicine).  Patients are able to view lab/test results, encounter notes, upcoming appointments, etc.  Non-urgent messages can be sent to your provider as well.   To learn more about what you can do with MyChart, go to [email or web address].    Your next appointment:   1 year(s)  The format for your next appointment:   In Person  Provider:   Sherren Mocha, MD  or APP     Important Information About Sugar         Signed, Sherren Mocha, MD  06/11/2022 5:26 PM    [place]

## 2022-06-10 NOTE — Patient Instructions (Signed)
Medication Instructions:  Your physician recommends that you continue on your current medications as directed. Please refer to the Current Medication list given to you today.  *If you need a refill on your cardiac medications before your next appointment, please call your pharmacy*   Lab Work: NONE If you have labs (blood work) drawn today and your tests are completely normal, you will receive your results only by: [place] (if you have MyChart) OR A paper copy in the mail If you have any lab test that is abnormal or we need to change your treatment, we will call you to review the results.   Testing/Procedures: NONE   Follow-Up: At [facility], you and your health needs are our priority.  As part of our continuing mission to provide you with exceptional heart care, we have created designated Provider Care Teams.  These Care Teams include your primary Cardiologist (physician) and Advanced Practice Providers (APPs -  Physician Assistants and Nurse Practitioners) who all work together to provide you with the care you need, when you need it.  We recommend signing up for the patient portal called "MyChart".  Sign up information is provided on this After Visit Summary.  MyChart is used to connect with patients for Virtual Visits (Telemedicine).  Patients are able to view lab/test results, encounter notes, upcoming appointments, etc.  Non-urgent messages can be sent to your provider as well.   To learn more about what you can do with MyChart, go to [email or web address].    Your next appointment:   1 year(s)  The format for your next appointment:   In Person  Provider:   Sherren Mocha, MD  or APP     Important Information About Sugar

## 2022-06-11 ENCOUNTER — Encounter: Payer: Self-pay | Admitting: Cardiovascular Disease

## 2022-06-18 DIAGNOSIS — I10 Essential (primary) hypertension: Secondary | ICD-10-CM | POA: Diagnosis not present

## 2022-06-18 DIAGNOSIS — J449 Chronic obstructive pulmonary disease, unspecified: Secondary | ICD-10-CM | POA: Diagnosis not present

## 2022-06-18 DIAGNOSIS — E1140 Type 2 diabetes mellitus with diabetic neuropathy, unspecified: Secondary | ICD-10-CM | POA: Diagnosis not present

## 2022-06-18 DIAGNOSIS — K219 Gastro-esophageal reflux disease without esophagitis: Secondary | ICD-10-CM | POA: Diagnosis not present

## 2022-06-18 DIAGNOSIS — E11649 Type 2 diabetes mellitus with hypoglycemia without coma: Secondary | ICD-10-CM | POA: Diagnosis not present

## 2022-07-08 ENCOUNTER — Ambulatory Visit (HOSPITAL_COMMUNITY)
Admission: RE | Admit: 2022-07-08 | Discharge: 2022-07-08 | Disposition: A | Discharge status: Home / Self Care | Payer: PPO | Source: Ambulatory Visit | Attending: Physician Assistant | Admitting: Physician Assistant

## 2022-07-08 ENCOUNTER — Other Ambulatory Visit (HOSPITAL_COMMUNITY): Payer: Self-pay | Admitting: Physician Assistant

## 2022-07-08 DIAGNOSIS — M7989 Other specified soft tissue disorders: Secondary | ICD-10-CM

## 2022-07-08 DIAGNOSIS — M79671 Pain in right foot: Secondary | ICD-10-CM | POA: Diagnosis not present

## 2022-07-08 NOTE — Progress Notes (Signed)
Lower extremity venous duplex has been completed.   Preliminary results in CV Proc.   Cheryl Brown 07/08/2022 3:48 PM

## 2022-07-14 DIAGNOSIS — H353211 Exudative age-related macular degeneration, right eye, with active choroidal neovascularization: Secondary | ICD-10-CM | POA: Diagnosis not present

## 2022-07-14 DIAGNOSIS — Z9889 Other specified postprocedural states: Secondary | ICD-10-CM | POA: Diagnosis not present

## 2022-07-14 DIAGNOSIS — H353223 Exudative age-related macular degeneration, left eye, with inactive scar: Secondary | ICD-10-CM | POA: Diagnosis not present

## 2022-08-11 DIAGNOSIS — M189 Osteoarthritis of first carpometacarpal joint, unspecified: Secondary | ICD-10-CM | POA: Diagnosis not present

## 2022-08-11 DIAGNOSIS — G2581 Restless legs syndrome: Secondary | ICD-10-CM | POA: Diagnosis not present

## 2022-08-11 DIAGNOSIS — N952 Postmenopausal atrophic vaginitis: Secondary | ICD-10-CM | POA: Diagnosis not present

## 2022-08-11 DIAGNOSIS — E559 Vitamin D deficiency, unspecified: Secondary | ICD-10-CM | POA: Diagnosis not present

## 2022-08-11 DIAGNOSIS — J321 Chronic frontal sinusitis: Secondary | ICD-10-CM | POA: Diagnosis not present

## 2022-08-11 DIAGNOSIS — K219 Gastro-esophageal reflux disease without esophagitis: Secondary | ICD-10-CM | POA: Diagnosis not present

## 2022-08-11 DIAGNOSIS — R0601 Orthopnea: Secondary | ICD-10-CM | POA: Diagnosis not present

## 2022-08-11 DIAGNOSIS — J849 Interstitial pulmonary disease, unspecified: Secondary | ICD-10-CM | POA: Diagnosis not present

## 2022-08-11 DIAGNOSIS — I10 Essential (primary) hypertension: Secondary | ICD-10-CM | POA: Diagnosis not present

## 2022-08-11 DIAGNOSIS — E1140 Type 2 diabetes mellitus with diabetic neuropathy, unspecified: Secondary | ICD-10-CM | POA: Diagnosis not present

## 2022-08-11 DIAGNOSIS — J449 Chronic obstructive pulmonary disease, unspecified: Secondary | ICD-10-CM | POA: Diagnosis not present

## 2022-08-25 DIAGNOSIS — H353211 Exudative age-related macular degeneration, right eye, with active choroidal neovascularization: Secondary | ICD-10-CM | POA: Diagnosis not present

## 2022-08-25 DIAGNOSIS — H43822 Vitreomacular adhesion, left eye: Secondary | ICD-10-CM | POA: Diagnosis not present

## 2022-08-25 DIAGNOSIS — H35371 Puckering of macula, right eye: Secondary | ICD-10-CM | POA: Diagnosis not present

## 2022-08-25 DIAGNOSIS — H4322 Crystalline deposits in vitreous body, left eye: Secondary | ICD-10-CM | POA: Diagnosis not present

## 2022-08-25 DIAGNOSIS — H353222 Exudative age-related macular degeneration, left eye, with inactive choroidal neovascularization: Secondary | ICD-10-CM | POA: Diagnosis not present

## 2022-08-28 ENCOUNTER — Ambulatory Visit (INDEPENDENT_AMBULATORY_CARE_PROVIDER_SITE_OTHER): Payer: PPO | Admitting: Podiatry

## 2022-08-28 ENCOUNTER — Ambulatory Visit: Payer: PPO

## 2022-08-28 ENCOUNTER — Encounter: Payer: Self-pay | Admitting: Podiatry

## 2022-08-28 DIAGNOSIS — Q828 Other specified congenital malformations of skin: Secondary | ICD-10-CM

## 2022-08-28 DIAGNOSIS — M21621 Bunionette of right foot: Secondary | ICD-10-CM

## 2022-08-28 DIAGNOSIS — M779 Enthesopathy, unspecified: Secondary | ICD-10-CM

## 2022-08-31 NOTE — Progress Notes (Signed)
Subjective:   Patient ID: Cheryl Brown, female   DOB: 87 y.o.   MRN: [id number]   HPI Patient presents with several problems with 1 being lesion plantar aspect right foot that is painful and also enlargement around the head of the fifth metatarsal right which can be bothersome at times   ROS      Objective:  Physical Exam  Neurovascular status intact with bone irritation fifth metatarsal right keratotic lesion subthird metatarsal right lucent core painful when pressed     Assessment:  2 different problems with 1 being tailor's bunion deformity right and secondarily inflammatory porokeratotic lesion painful plantar right     Plan:  H&P reviewed both conditions and discussed the possibility for fifth metatarsal head resection in the future.  At this point I went ahead and I debrided the lesion on the plantar right third metatarsal neurogenic bleeding reappoint as needed

## 2022-09-01 ENCOUNTER — Encounter: Payer: Self-pay | Admitting: Pulmonary Disease

## 2022-09-01 ENCOUNTER — Ambulatory Visit: Payer: PPO | Admitting: Pulmonary Disease

## 2022-09-01 VITALS — BP 126/80 | HR 78 | Temp 98.1°F | Ht 62.5 in | Wt 197.2 lb

## 2022-09-01 DIAGNOSIS — M189 Osteoarthritis of first carpometacarpal joint, unspecified: Secondary | ICD-10-CM | POA: Diagnosis not present

## 2022-09-01 DIAGNOSIS — I10 Essential (primary) hypertension: Secondary | ICD-10-CM | POA: Diagnosis not present

## 2022-09-01 DIAGNOSIS — J4489 Other specified chronic obstructive pulmonary disease: Secondary | ICD-10-CM | POA: Diagnosis not present

## 2022-09-01 DIAGNOSIS — J449 Chronic obstructive pulmonary disease, unspecified: Secondary | ICD-10-CM | POA: Diagnosis not present

## 2022-09-01 DIAGNOSIS — J849 Interstitial pulmonary disease, unspecified: Secondary | ICD-10-CM

## 2022-09-01 DIAGNOSIS — E1140 Type 2 diabetes mellitus with diabetic neuropathy, unspecified: Secondary | ICD-10-CM | POA: Diagnosis not present

## 2022-09-01 DIAGNOSIS — K219 Gastro-esophageal reflux disease without esophagitis: Secondary | ICD-10-CM | POA: Diagnosis not present

## 2022-09-01 NOTE — Progress Notes (Signed)
Cheryl Brown    [id number]    [birth year]  Primary Care Physician:Gates, [name], MD  Referring Physician: [name], MD [address] [place],  [place] [address]  Chief complaint: Follow up for emphysema, interstitial lung disease.  HPI: 87 y.o. with history of hypertension, diabetes, COPD, OSA (noncompliant with CPAP].   She was diagnosed with COPD in 2012.  Has dyspnea with rest, chronic cough with white mucus, wheezing, occasional fevers and chills at night.  She does not have seasonal allergies. + for acid reflux and indigestion.   Diagnosed with sleep apnea.  She has been intolerant of CPAP and is not interested in trying again. History noted for elevated ANA with positive ro antibody.  She was referred to Dr. Amil Amen in August 2018 but was told she did not have any rheumatologic issue.  She continues to have dry mouth, dry eyes.  Denies any joint pain, rash.  Office note from Dr. Amil Amen, rheumatology dated 05/20/17 Evaluated for positive ANA, SSA. Thought to be false-positive in spite of having xerostomia.  No systemic immunosuppression reccomended as there are no extraglandular manifestation.  Underwent bronchoscopy on 06/13/2019 and discussion at multidisciplinary conference on 06/20/2019 with diagnosis of hypersensitivity pneumonitis with moderate confidence. She was started on prednisone on 06/28/19 at 40 mg which was then tapered to 20 mg in 07/26/2019. Came off the prednisone in January 2020.  She did not like being on the steroids which caused weight gain and she cannot tell if it made any difference with her breathing  Diagnosed with COVID-19 in January 2020.  She did not need admission and self isolated at home  Pets: No pets Occupation: Retired from [employer] in 1979 Exposures: No known exposures, no mold, hot tub. ILD questionnaire-Negative for exposures. Smoking history: 30-pack-year smoking history.  Quit in 1988 Travel History:  Lived in [place] all her life.  No significant travel history.  Interim history: Has chronic dyspnea on exertion without change.  States that she is developed mild dryness of unclear reason and also chest heaviness   Outpatient Encounter Medications as of 09/01/2022  Medication Sig   albuterol (VENTOLIN HFA) 108 (90 Base) MCG/ACT inhaler Inhale 2 puffs into the lungs every 6 (six) hours as needed.    amitriptyline (ELAVIL) 50 MG tablet Take 50 mg by mouth at bedtime.   atorvastatin (LIPITOR) 20 MG tablet Take 20 mg by mouth daily.   BD PEN NEEDLE NANO U/F 32G X 4 MM MISC use as directed once daily   betamethasone valerate (VALISONE) 0.1 % cream APPLY A THIN LAYER TO AFFECTED AREA(S) TWICE A DAY FOR 2 WEEKS THEN USE 2 TO 3 TIMES A WEEK   clotrimazole (LOTRIMIN) 1 % cream as needed.   estradiol (ESTRACE) 0.1 MG/GM vaginal cream Place 1 g vaginally 3 (three) times a week.   furosemide (LASIX) 20 MG tablet Take 1 tablet (20 mg total) by mouth daily.   glimepiride (AMARYL) 2 MG tablet Take 4 mg by mouth daily.    glucose blood (ONE TOUCH ULTRA TEST) test strip 1 each by Other route daily. Use to check blood sugar daily Dx: E11.41   losartan (COZAAR) 50 MG tablet Take 50 mg by mouth daily.   omeprazole (PRILOSEC) 20 MG capsule Take 20 mg by mouth daily.   ONETOUCH DELICA LANCETS MISC Check blood sugar as directed   OXYGEN See admin instructions. Patient take 3 liters at bedtime and at daytime if  needed   potassium chloride SA (KLOR-CON) 20 MEQ tablet Take 20 mEq by mouth daily.   pramipexole (MIRAPEX) 0.5 MG tablet Take 0.5 mg by mouth at bedtime.   senna-docusate (SENOKOT-S) 8.6-50 MG tablet Take 1 tablet by mouth at bedtime as needed for mild constipation or moderate constipation.   tobramycin (TOBREX) 0.3 % ophthalmic solution as directed.   TOUJEO SOLOSTAR 300 UNIT/ML SOPN Inject 42 Units into the skin every evening.    No facility-administered encounter medications on file as of 09/01/2022.    Physical Exam: Blood pressure 126/80, pulse 78, temperature 98.1 F (36.7 C), temperature source Oral, height 5' 2.5" (1.588 m), weight 197 lb 3.2 oz (89.4 kg), SpO2 95 %. Gen:      No acute distress HEENT:  EOMI, sclera anicteric Neck:     No masses; no thyromegaly Lungs:    Clear to auscultation bilaterally; normal respiratory effort CV:         Regular rate and rhythm; no murmurs Abd:      + bowel sounds; soft, non-tender; no palpable masses, no distension Ext:    No edema; adequate peripheral perfusion Skin:      Warm and dry; no rash Neuro: alert and oriented x 3 Psych: normal mood and affect   Data Reviewed: Imaging CT abdomen pelvis 08/28/16-bibasilar opacities, atelectasis Chest x-ray 08/28/16-coarse interstitial marking, nodular opacity in the left midlung region. Chest x-ray 09/10/17- stable coarse interstitial markings. High-resolution CT chest 09/15/17- bronchial wall thickening with mild emphysema.  Diffuse groundglass attenuation, septal thickening, bronchiectasis with moderate air trapping.  High-resolution CT 04/14/18-mild emphysema.  Stable findings of groundglass opacities, septal thickening, bronchiectasis with air-trapping.  Indeterminate for UIP.  Left main and three-vessel coronary artery disease. High-resolution CT 05/19/2019-stable findings of interstitial lung disease as before.  Possible chronic HP.  With slight progression compared to 2019 CT high-resolution 12/27/2019-stable interstitial lung disease. I have reviewed the images personally.  PFTs  09/21/17 FVC 1.96 [79%], FEV1 1.64 [90%], F/F 84, TLC 73%, DLCO 12.43 [55%]   04/22/2018 FVC 2.0 [82%], FEV1 1.73 [96%], F/F 87, TLC 74%, DLCO 12.04 [54%]  01/23/2019 FVC 1.95 [80%], FEV1 1.56 [86%],/F 80, DLCO 10.92 [61%]  11/21/2019 FVC 1.77 [1 4%), FEV1 1.46 [82%], F/F 82, DLCO 9.11 [62%  12/13/2020 FVC 1.88 [80%], FEV1 1.55 [9%], F/F 83, TLC 3.18 [64%], DLCO 11.46 [64%]  04/29/22 FVC 1.77 [7 7%], FEV1 1.50 [88%],  F/F 85, TLC 3.62 [75%], DLCO 10.30 [58%] Mild diffusion defect, mild restriction  FENO 09/10/17- 9  6-minute walk test 11/02/17-  Starting heart rate, sats-69, 93% Starting heart rate, sats-108, 94% Distance walked 408,  Patient stopped with 30 seconds left due to SOB and leg cramps.  6-minute walk 04/08/2020-408 m  Labs: 04/05/17-positive ANA, Ro antibody 1.4  Repeat labs 09/21/17 Blood allergy profile-negative, IgE 4 ILD panel-ANA negative, angiotensin-converting enzyme-86 Rheumatoid factor, CCP-negative Ro antibody 1.1 La negative Hypersensitivity panel-negative  Cardiac Echocardiogram 09/24/17- Normal LV size with mild LV hypertrophy. EF 55-60%. Normal RV size and systolic function. No significant valvular abnormalities.  PA peak pressure 31  Cardiac stress test 09/24/2016- low risk study, EF 55-65%.  Bronchoscopy 06/13/2019 Cell count nucleated cells 213, 30% lymphs, 18% eos, 22% neutrophils, 30% monocyte macrophage Cultures -normal respiratory flora Transbronchial biopsy-isolated nonnecrotizing microgranuloma  Assessment:  Interstitial lung disease CT scan reviewed which shows nonspecific interstitial lung disease with air trapping.  Looks like chronic HP.  She does endorse exposure to hairspray which may be the allergen.  She  has been evaluated by Dr. Amil Amen in the past for positive ANA, Ro antibody and was told that there is no evidence of rheumatologic issues.   With bronchoscope showing lymphocytosis, granuloma on path and possible exposure to hairspray this is high likelihood of being hypersensitivity pneumonitis. Not sure if there is additional underlying CTD associated ILD with positive serologies  I have asked her to avoid exposure to the hairspray and she has changed the brand. She has not tolerated prednisone.  We will continue monitoring for now.  If worsening on follow-up high-res CT then reconsider treatment plan.   I have a low threshold to start CellCept as  there is still a question of connective tissue disease  PFTs have been largely stable over the years.  She did not get her CT scan as planned and we will reorder it.  Follow-up in 1-2 months  Emphysema PFTs shows mild emphysematous changes.  There is no evidence of obstruction on PFTs.  She is off Trelegy inhaler and continues supplemental oxygen at night.  GERD She has ongoing GERD and may have reflux contributing to ILD.   Continue prilosec  Plan/Recommendations: - Monitor off steroids - Follow-up HRCT at next available  Marshell Garfinkel MD [facility] 09/01/2022, 3:51 PM

## 2022-09-01 NOTE — Patient Instructions (Signed)
Will get a CT scan high-resolution for evaluation of the lung Follow-up in 1 to 2 months for review and plan for next steps.

## 2022-09-10 ENCOUNTER — Ambulatory Visit (HOSPITAL_COMMUNITY): Payer: PPO | Attending: Pulmonary Disease

## 2022-09-18 ENCOUNTER — Ambulatory Visit: Payer: PPO | Admitting: Pulmonary Disease

## 2022-10-06 DIAGNOSIS — H4322 Crystalline deposits in vitreous body, left eye: Secondary | ICD-10-CM | POA: Diagnosis not present

## 2022-10-06 DIAGNOSIS — H353222 Exudative age-related macular degeneration, left eye, with inactive choroidal neovascularization: Secondary | ICD-10-CM | POA: Diagnosis not present

## 2022-10-06 DIAGNOSIS — H43822 Vitreomacular adhesion, left eye: Secondary | ICD-10-CM | POA: Diagnosis not present

## 2022-10-06 DIAGNOSIS — H35371 Puckering of macula, right eye: Secondary | ICD-10-CM | POA: Diagnosis not present

## 2022-10-06 DIAGNOSIS — H353211 Exudative age-related macular degeneration, right eye, with active choroidal neovascularization: Secondary | ICD-10-CM | POA: Diagnosis not present

## 2022-10-13 ENCOUNTER — Other Ambulatory Visit (HOSPITAL_BASED_OUTPATIENT_CLINIC_OR_DEPARTMENT_OTHER): Payer: PPO

## 2022-10-28 ENCOUNTER — Ambulatory Visit (HOSPITAL_COMMUNITY)
Admission: RE | Admit: 2022-10-28 | Discharge: 2022-10-28 | Disposition: A | Discharge status: Home / Self Care | Payer: PPO | Source: Ambulatory Visit | Attending: Pulmonary Disease | Admitting: Pulmonary Disease

## 2022-10-28 ENCOUNTER — Encounter (HOSPITAL_COMMUNITY): Payer: Self-pay

## 2022-10-28 DIAGNOSIS — J849 Interstitial pulmonary disease, unspecified: Secondary | ICD-10-CM | POA: Insufficient documentation

## 2022-10-28 DIAGNOSIS — R918 Other nonspecific abnormal finding of lung field: Secondary | ICD-10-CM | POA: Diagnosis not present

## 2022-11-16 ENCOUNTER — Encounter: Payer: Self-pay | Admitting: Pulmonary Disease

## 2022-11-16 ENCOUNTER — Ambulatory Visit (INDEPENDENT_AMBULATORY_CARE_PROVIDER_SITE_OTHER): Payer: PPO | Admitting: Pulmonary Disease

## 2022-11-16 VITALS — BP 136/82 | HR 87 | Temp 98.1°F | Ht 62.5 in | Wt 199.4 lb

## 2022-11-16 DIAGNOSIS — J849 Interstitial pulmonary disease, unspecified: Secondary | ICD-10-CM

## 2022-11-16 DIAGNOSIS — J4489 Other specified chronic obstructive pulmonary disease: Secondary | ICD-10-CM

## 2022-11-16 NOTE — Patient Instructions (Signed)
I am glad your breathing is stable Your CT shows stable changes in the lungs which we will continue to monitor Order repeat high-resolution CT in 1 year Return to clinic in 1 year.

## 2022-11-16 NOTE — Addendum Note (Signed)
Addended by: Jacquiline Doe on: 11/16/2022 02:47 PM   Modules accepted: Orders

## 2022-11-16 NOTE — Progress Notes (Signed)
Cheryl HawkingDoris A Bacci    [id number]    Apr 01, 1936  Primary Care Physician:Raju, Dorris SinghSneha P, MD  Referring Physician: Marden NobleGates, Robert, MD [address] [place],  [place] [id number]  Chief complaint: Follow up for emphysema, interstitial lung disease.  HPI: 87 y.o. with history of hypertension, diabetes, COPD, OSA (noncompliant with CPAP].   She was diagnosed with COPD in 2012.  Has dyspnea with rest, chronic cough with white mucus, wheezing, occasional fevers and chills at night.  She does not have seasonal allergies. + for acid reflux and indigestion.   Diagnosed with sleep apnea.  She has been intolerant of CPAP and is not interested in trying again. History noted for elevated ANA with positive ro antibody.  She was referred to Dr. Dierdre ForthBeekman in August 2018 but was told she did not have any rheumatologic issue.  She continues to have dry mouth, dry eyes.  Denies any joint pain, rash.  Office note from Dr. Dierdre ForthBeekman, rheumatology dated 05/20/17 Evaluated for positive ANA, SSA. Thought to be false-positive in spite of having xerostomia.  No systemic immunosuppression reccomended as there are no extraglandular manifestation.  Underwent bronchoscopy on 06/13/2019 and discussion at multidisciplinary conference on 06/20/2019 with diagnosis of hypersensitivity pneumonitis with moderate confidence. She was started on prednisone on 06/28/19 at 40 mg which was then tapered to 20 mg in 07/26/2019. Came off the prednisone in January 2020.  She did not like being on the steroids which caused weight gain and she cannot tell if it made any difference with her breathing  Diagnosed with COVID-19 in January 2020.  She did not need admission and self isolated at home  Pets: No pets Occupation: Retired from [employer] in 1979 Exposures: No known exposures, no mold, hot tub. ILD questionnaire-Negative for exposures. Smoking history: 30-pack-year smoking history.  Quit in 1988 Travel History:  Lived in [place] all her life.  No significant travel history.  Interim history: Has chronic dyspnea on exertion without change.  States that she is developed mild dryness of mouth which she attributes to amitriptyline. She is here for review of CT scan   Outpatient Encounter Medications as of 11/16/2022  Medication Sig   albuterol (VENTOLIN HFA) 108 (90 Base) MCG/ACT inhaler Inhale 2 puffs into the lungs every 6 (six) hours as needed.    amitriptyline (ELAVIL) 50 MG tablet Take 50 mg by mouth at bedtime.   atorvastatin (LIPITOR) 20 MG tablet Take 20 mg by mouth daily.   BD PEN NEEDLE NANO U/F 32G X 4 MM MISC use as directed once daily   betamethasone valerate (VALISONE) 0.1 % cream APPLY A THIN LAYER TO AFFECTED AREA(S) TWICE A DAY FOR 2 WEEKS THEN USE 2 TO 3 TIMES A WEEK   clotrimazole (LOTRIMIN) 1 % cream as needed.   estradiol (ESTRACE) 0.1 MG/GM vaginal cream Place 1 g vaginally 3 (three) times a week.   furosemide (LASIX) 20 MG tablet Take 1 tablet (20 mg total) by mouth daily.   glimepiride (AMARYL) 2 MG tablet Take 4 mg by mouth daily.    glucose blood (ONE TOUCH ULTRA TEST) test strip 1 each by Other route daily. Use to check blood sugar daily Dx: E11.41   losartan (COZAAR) 50 MG tablet Take 50 mg by mouth daily.   omeprazole (PRILOSEC) 20 MG capsule Take 20 mg by mouth daily.   ONETOUCH DELICA LANCETS MISC Check blood sugar as directed   OXYGEN See admin instructions. Patient  take 3 liters at bedtime and at daytime if needed   potassium chloride SA (KLOR-CON) 20 MEQ tablet Take 20 mEq by mouth daily.   pramipexole (MIRAPEX) 0.5 MG tablet Take 0.5 mg by mouth at bedtime.   senna-docusate (SENOKOT-S) 8.6-50 MG tablet Take 1 tablet by mouth at bedtime as needed for mild constipation or moderate constipation.   tobramycin (TOBREX) 0.3 % ophthalmic solution as directed.   TOUJEO SOLOSTAR 300 UNIT/ML SOPN Inject 42 Units into the skin every evening.    No facility-administered encounter  medications on file as of 11/16/2022.   Physical Exam: Blood pressure 136/82, pulse 87, temperature 98.1 F (36.7 C), temperature source Oral, height 5' 2.5" (1.588 m), weight 199 lb 6.4 oz (90.4 kg), SpO2 92 %. Gen:      No acute distress HEENT:  EOMI, sclera anicteric Neck:     No masses; no thyromegaly Lungs:    Clear to auscultation bilaterally; normal respiratory effort CV:         Regular rate and rhythm; no murmurs Abd:      + bowel sounds; soft, non-tender; no palpable masses, no distension Ext:    No edema; adequate peripheral perfusion Skin:      Warm and dry; no rash Neuro: alert and oriented x 3 Psych: normal mood and affect   Data Reviewed: Imaging CT abdomen pelvis 08/28/16-bibasilar opacities, atelectasis Chest x-ray 08/28/16-coarse interstitial marking, nodular opacity in the left midlung region. Chest x-ray 09/10/17- stable coarse interstitial markings. High-resolution CT chest 09/15/17- bronchial wall thickening with mild emphysema.  Diffuse groundglass attenuation, septal thickening, bronchiectasis with moderate air trapping.  High-resolution CT 04/14/18-mild emphysema.  Stable findings of groundglass opacities, septal thickening, bronchiectasis with air-trapping.  Indeterminate for UIP.  Left main and three-vessel coronary artery disease. High-resolution CT 05/19/2019-stable findings of interstitial lung disease as before.  Possible chronic HP.  With slight progression compared to 2019 CT high-resolution 12/27/2019-stable interstitial lung disease. High resolution CT 10/28/2022-stable interstitial lung disease I have reviewed the images personally.  PFTs  09/21/17 FVC 1.96 [79%], FEV1 1.64 [90%], F/F 84, TLC 73%, DLCO 12.43 [55%]   04/22/2018 FVC 2.0 [82%], FEV1 1.73 [96%], F/F 87, TLC 74%, DLCO 12.04 [54%]  01/23/2019 FVC 1.95 [80%], FEV1 1.56 [86%],/F 80, DLCO 10.92 [61%]  11/21/2019 FVC 1.77 [1 4%), FEV1 1.46 [82%], F/F 82, DLCO 9.11 [62%  12/13/2020 FVC 1.88 [80%], FEV1  1.55 [9%], F/F 83, TLC 3.18 [64%], DLCO 11.46 [64%]  04/29/22 FVC 1.77 [7 7%], FEV1 1.50 [88%], F/F 85, TLC 3.62 [75%], DLCO 10.30 [58%] Mild diffusion defect, mild restriction  FENO 09/10/17- 9  6-minute walk test 11/02/17-  Starting heart rate, sats-69, 93% Starting heart rate, sats-108, 94% Distance walked 408,  Patient stopped with 30 seconds left due to SOB and leg cramps.  6-minute walk 04/08/2020-408 m  Labs: 04/05/17-positive ANA, Ro antibody 1.4  Repeat labs 09/21/17 Blood allergy profile-negative, IgE 4 ILD panel-ANA negative, angiotensin-converting enzyme-86 Rheumatoid factor, CCP-negative Ro antibody 1.1 La negative Hypersensitivity panel-negative  Cardiac Echocardiogram 12/16/2021-LVEF 55%, grade 1 diastolic dysfunction, RV systolic function is normal.  Normal PA systolic pressure.  Estimated RVSP 28.6  Cardiac stress test 09/24/2016- low risk study, EF 55-65%.  Bronchoscopy 06/13/2019 Cell count nucleated cells 213, 30% lymphs, 18% eos, 22% neutrophils, 30% monocyte macrophage Cultures -normal respiratory flora Transbronchial biopsy-isolated nonnecrotizing microgranuloma  Assessment:  Interstitial lung disease CT scan reviewed which shows nonspecific interstitial lung disease with air trapping.  Looks like chronic HP.  She does  endorse exposure to hairspray which may be the allergen.  She has been evaluated by Dr. Dierdre Forth in the past for positive ANA, Ro antibody and was told that there is no evidence of rheumatologic issues.   With bronchoscope showing lymphocytosis, granuloma on path and possible exposure to hairspray this is high likelihood of being hypersensitivity pneumonitis. Not sure if there is additional underlying CTD associated ILD with positive serologies  I have asked her to avoid exposure to the hairspray and she has changed the brand. She has not tolerated prednisone.  We will continue monitoring for now.  If worsening on follow-up high-res CT then  reconsider treatment plan.   Can consider CellCept as there is still a question of connective tissue disease but since she has been stable we have held off  PFTs and PFTs have been largely stable over the years.  Order follow-up CT in 1 year  Enlarged PA Has been noted over several years.  No evidence of pulmonary hypertension on echocardiogram in 2023  Emphysema PFTs shows mild emphysematous changes.  There is no evidence of obstruction on PFTs.  She is off Trelegy inhaler and continues supplemental oxygen at night.  GERD She has ongoing GERD and may have reflux contributing to ILD.   Continue prilosec  Plan/Recommendations: -High-res CT in 1 year.  Chilton Greathouse MD [facility] 11/16/2022, 2:27 PM

## 2022-11-24 DIAGNOSIS — H43811 Vitreous degeneration, right eye: Secondary | ICD-10-CM | POA: Diagnosis not present

## 2022-11-24 DIAGNOSIS — H353222 Exudative age-related macular degeneration, left eye, with inactive choroidal neovascularization: Secondary | ICD-10-CM | POA: Diagnosis not present

## 2022-11-24 DIAGNOSIS — H353211 Exudative age-related macular degeneration, right eye, with active choroidal neovascularization: Secondary | ICD-10-CM | POA: Diagnosis not present

## 2022-11-24 DIAGNOSIS — H35371 Puckering of macula, right eye: Secondary | ICD-10-CM | POA: Diagnosis not present

## 2022-11-24 DIAGNOSIS — H4322 Crystalline deposits in vitreous body, left eye: Secondary | ICD-10-CM | POA: Diagnosis not present

## 2022-11-24 DIAGNOSIS — H43822 Vitreomacular adhesion, left eye: Secondary | ICD-10-CM | POA: Diagnosis not present

## 2023-01-19 DIAGNOSIS — H43811 Vitreous degeneration, right eye: Secondary | ICD-10-CM | POA: Diagnosis not present

## 2023-01-19 DIAGNOSIS — H4322 Crystalline deposits in vitreous body, left eye: Secondary | ICD-10-CM | POA: Diagnosis not present

## 2023-01-19 DIAGNOSIS — H43822 Vitreomacular adhesion, left eye: Secondary | ICD-10-CM | POA: Diagnosis not present

## 2023-01-19 DIAGNOSIS — H353211 Exudative age-related macular degeneration, right eye, with active choroidal neovascularization: Secondary | ICD-10-CM | POA: Diagnosis not present

## 2023-01-19 DIAGNOSIS — H353222 Exudative age-related macular degeneration, left eye, with inactive choroidal neovascularization: Secondary | ICD-10-CM | POA: Diagnosis not present

## 2023-01-19 DIAGNOSIS — I288 Other diseases of pulmonary vessels: Secondary | ICD-10-CM | POA: Insufficient documentation

## 2023-01-19 DIAGNOSIS — H35371 Puckering of macula, right eye: Secondary | ICD-10-CM | POA: Diagnosis not present

## 2023-01-19 NOTE — Progress Notes (Unsigned)
Cardiology Office Note:    Date:  01/21/2023   ID:  Cheryl Brown, DOB May 15, 1936, MRN [id number]  PCP:  Thana Ates, MD  [facility] Providers Cardiologist:  Tonny Bollman, MD    Referring MD: Thana Ates, MD   Chief Complaint:  No chief complaint on file.    Patient Profile:  Hypertension Hyperlipidemia Hypersensitivity pneumonitis COPD OSA Intolerant of CPAP Restless leg syndrome  Cardiac Studies & Procedures     STRESS TESTS  MYOCARDIAL PERFUSION IMAGING 09/24/2016  Narrative  The left ventricular ejection fraction is normal (55-65%).  Nuclear stress EF: 62%.  There was no ST segment deviation noted during stress.  No T wave inversion was noted during stress.  The study is normal.  This is a low risk study.  Low risk stress nuclear study with normal perfusion and normal left ventricular regional and global systolic function.   ECHOCARDIOGRAM  ECHOCARDIOGRAM COMPLETE 12/18/2021  Narrative ECHOCARDIOGRAM REPORT    Patient Name:   Cheryl Brown Date of Exam: 12/18/2021 Medical Rec #:  [id number]       Height:       60.5 in Accession #:    [id number]      Weight:       203.4 lb Date of Birth:  Nov 12, 1935      BSA:          1.892 m Patient Age:    87 years        BP:           182/110 mmHg Patient Gender: F               HR:           68 bpm. Exam Location:  [address]  Procedure: 2D Echo, Cardiac Doppler, Color Doppler and Strain Analysis  Indications:    R06.00 SOB  History:        Patient has prior history of Echocardiogram examinations, most recent 09/24/2017. COPD and Edema, Signs/Symptoms:Palpitations; Risk Factors:Hypertension, Diabetes and HLD.  Sonographer:    Clearence Ped RCS Referring Phys: [phone] [place] Coupe, JR DICK  IMPRESSIONS   1. Left ventricular ejection fraction, by estimation, is 55%. The left ventricle has normal function. The left ventricle has no regional wall motion abnormalities. Left ventricular  diastolic parameters are consistent with Grade I diastolic dysfunction (impaired relaxation). 2. Right ventricular systolic function is normal. The right ventricular size is normal. There is normal pulmonary artery systolic pressure. The estimated right ventricular systolic pressure is 28.6 mmHg. 3. Left atrial size was mildly dilated. 4. The mitral valve is normal in structure. Trivial mitral valve regurgitation. No evidence of mitral stenosis. 5. The aortic valve is tricuspid. There is mild calcification of the aortic valve. Aortic valve regurgitation is not visualized. No aortic stenosis is present. 6. The inferior vena cava is normal in size with greater than 50% respiratory variability, suggesting right atrial pressure of 3 mmHg.  FINDINGS Left Ventricle: Left ventricular ejection fraction, by estimation, is 55%. The left ventricle has normal function. The left ventricle has no regional wall motion abnormalities. The left ventricular internal cavity size was normal in size. There is no left ventricular hypertrophy. Left ventricular diastolic parameters are consistent with Grade I diastolic dysfunction (impaired relaxation).  Right Ventricle: The right ventricular size is normal. No increase in right ventricular wall thickness. Right ventricular systolic function is normal. There is normal pulmonary artery systolic pressure. The tricuspid regurgitant velocity is 2.53 m/s, and  with an assumed right atrial pressure of 3 mmHg, the estimated right ventricular systolic pressure is 28.6 mmHg.  Left Atrium: Left atrial size was mildly dilated.  Right Atrium: Right atrial size was normal in size.  Pericardium: There is no evidence of pericardial effusion.  Mitral Valve: The mitral valve is normal in structure. Mild mitral annular calcification. Trivial mitral valve regurgitation. No evidence of mitral valve stenosis.  Tricuspid Valve: The tricuspid valve is normal in structure. Tricuspid valve  regurgitation is trivial.  Aortic Valve: The aortic valve is tricuspid. There is mild calcification of the aortic valve. Aortic valve regurgitation is not visualized. No aortic stenosis is present.  Pulmonic Valve: The pulmonic valve was normal in structure. Pulmonic valve regurgitation is not visualized.  Aorta: The aortic root is normal in size and structure.  Venous: The inferior vena cava is normal in size with greater than 50% respiratory variability, suggesting right atrial pressure of 3 mmHg.  IAS/Shunts: No atrial level shunt detected by color flow Doppler.   LEFT VENTRICLE PLAX 2D LVIDd:         4.20 cm   Diastology LVIDs:         2.80 cm   LV e' medial:    6.85 cm/s LV PW:         0.90 cm   LV E/e' medial:  13.2 LV IVS:        1.00 cm   LV e' lateral:   9.46 cm/s LVOT diam:     1.90 cm   LV E/e' lateral: 9.5 LV SV:         87 LV SV Index:   46        2D Longitudinal Strain LVOT Area:     2.84 cm  2D Strain GLS (A2C):   -17.8 % 2D Strain GLS (A3C):   -14.1 % 2D Strain GLS (A4C):   -26.1 % 2D Strain GLS Avg:     -19.3 %  RIGHT VENTRICLE RV Basal diam:  2.00 cm RV S prime:     10.20 cm/s TAPSE (M-mode): 2.0 cm RVSP:           28.6 mmHg  LEFT ATRIUM             Index        RIGHT ATRIUM           Index LA diam:        3.70 cm 1.96 cm/m   RA Pressure: 3.00 mmHg LA Vol (A2C):   42.5 ml 22.47 ml/m  RA Area:     7.15 cm LA Vol (A4C):   35.6 ml 18.82 ml/m  RA Volume:   8.32 ml   4.40 ml/m LA Biplane Vol: 38.6 ml 20.41 ml/m AORTIC VALVE LVOT Vmax:   117.00 cm/s LVOT Vmean:  93.300 cm/s LVOT VTI:    0.308 m  AORTA Ao Root diam: 3.60 cm Ao Asc diam:  3.70 cm  MITRAL VALVE                TRICUSPID VALVE MV Area (PHT):              TR Peak grad:   25.6 mmHg MV Decel Time:              TR Vmax:        253.00 cm/s MV E velocity: 90.10 cm/s   Estimated RAP:  3.00 mmHg MV A velocity: 122.00 cm/s  RVSP:  28.6 mmHg MV E/A ratio:  0.74 SHUNTS Systemic VTI:   0.31 m Systemic Diam: 1.90 cm  Dalton McleanMD Electronically signed by Wilfred Lacy Signature Date/Time: 12/18/2021/8:15:07 PM    Final              History of Present Illness:   Cheryl Brown is a 87 y.o. female with the above problem list.  She presents today for cardiology follow up and to discuss her recent CT. Per patient, her pulmonologist told her that her "heart was strained" and she should follow up with cardiology. Patient is also followed by pulmonology, last seen 11/16/22 for review of updated high resolution pulmonary CT(from 10/28/22) . Per notes, CT with chronic hypersensitivity pneumonitis. It was noted that hairspray exposure felt to be trigger. This study also noted overall stable enlarged pulmonary artery (4.0cm in diameter in 2024 study, 4.1 in 2022). TTE after the initial observation of enlarged pulmonary artery showed normal pulmonary artery systolic pressure.  She was last seen by Dr. Excell Seltzer in November 2023. At that time, patient reported ongoing chronic dyspnea, O2 dependence (though not wearing in the day). Patient was continued on Losartan and Atorvastatin.  Today patient reports that her dyspnea has worsened with warmer/humid weather. She continues to wear O2 continuously only at night, PRN daytime use. She reports that the weight of her condenser is a barrier to more regular use. She admits to easy dyspnea while completing cleaning tasks around the house and she has to take frequent breaks. Is not able to ambulate long distances either without support of O2. Her husband has recently transitioned to hospice care and this is also a stressor for her. They do have the support of nearby family but she voices concern that they are a burden to family. Patient has switched to taking her Lasix 20mg  PRN due to frequent incontinence. She hasn't noticed significant increase in lower extremity edema with PRN use. Denies orthopnea or PND. Today patient also denies chest pain,  palpitations, melena, hematuria, hemoptysis, diaphoresis, presyncope, syncope. She continues to have regular sleep disruption secondary to restless leg syndrome.     Past Medical History:  Diagnosis Date   Blood transfusion    COPD (chronic obstructive pulmonary disease) (HCC)    Diabetes mellitus    Dysmetabolic syndrome    Hyperlipemia    Hypertension    IBS (irritable bowel syndrome)    Neoplasm of pituitary gland    Dr.Nudelman    Palpitations    Pituitary adenoma (HCC) 1991   Dr.Love and Dr.Nudelman    Recurrent UTI 2013-14   Dr. Cleatrice Burke and Dr. Senaida Ores   Reflux esophagitis    Restless leg syndrome    Scoliosis    Spinal Stenosis   Shingles outbreak    Current Medications: Current Meds  Medication Sig   amitriptyline (ELAVIL) 50 MG tablet Take 50 mg by mouth at bedtime.   atorvastatin (LIPITOR) 20 MG tablet Take 20 mg by mouth daily.   BD PEN NEEDLE NANO U/F 32G X 4 MM MISC use as directed once daily   betamethasone valerate (VALISONE) 0.1 % cream APPLY A THIN LAYER TO AFFECTED AREA(S) TWICE A DAY FOR 2 WEEKS THEN USE 2 TO 3 TIMES A WEEK   clotrimazole (LOTRIMIN) 1 % cream as needed.   estradiol (ESTRACE) 0.1 MG/GM vaginal cream Place 1 g vaginally 3 (three) times a week.   furosemide (LASIX) 20 MG tablet Take 1 tablet (20 mg total) by mouth daily. (Patient taking differently:  Take 20 mg by mouth as needed for fluid or edema.)   glucose blood (ONE TOUCH ULTRA TEST) test strip 1 each by Other route daily. Use to check blood sugar daily Dx: E11.41   magnesium hydroxide (MILK OF MAGNESIA) 400 MG/5ML suspension Take by mouth daily as needed for mild constipation.   omeprazole (PRILOSEC) 20 MG capsule Take 20 mg by mouth daily.   ONETOUCH DELICA LANCETS MISC Check blood sugar as directed   OXYGEN See admin instructions. Patient take 3 liters at bedtime and at daytime if needed   tobramycin (TOBREX) 0.3 % ophthalmic solution as directed.   TOUJEO SOLOSTAR 300 UNIT/ML SOPN  Inject 42 Units into the skin every evening.    [DISCONTINUED] losartan (COZAAR) 50 MG tablet Take 50 mg by mouth daily.    Allergies:   Morphine and codeine, Bacitracin-polymyxin b, Codeine, Empagliflozin, Ipratropium-albuterol, Ipratropium-albuterol, Metformin hcl, Morphine sulfate, Neosporin [neomycin-bacitracin zn-polymyx], Niaspan [niacin], Nitrofurantoin macrocrystal, Norvasc [amlodipine besylate], Novolin n [insulin isophane], Pregabalin, Propoxyphene n-acetaminophen, Simvastatin, Sulfa antibiotics, Sulfonamide derivatives, Pramipexole, and Pramipexole dihydrochloride   Social History   Occupational History   Occupation: Retired  Tobacco Use   Smoking status: Former    Packs/day: 0.50    Years: 25.00    Additional pack years: 0.00    Total pack years: 12.50    Types: Cigarettes    Quit date: 08/10/1978    Years since quitting: 44.4   Smokeless tobacco: Never  Vaping Use   Vaping Use: Never used  Substance and Sexual Activity   Alcohol use: No   Drug use: No   Sexual activity: Not on file    Family Hx: The patient's family history includes Alzheimer's disease in her maternal aunt, mother, and sister; Arthritis in her mother; Bone cancer in her father; Breast cancer (age of onset: 60) in her sister; Cancer in her father; Coronary artery disease in her father; Diabetes in her father and sister; Hypertension in her sister; Mental illness in her maternal aunt, maternal grandmother, and mother; Mitral valve prolapse in her sister; Osteoporosis in her sister; Prostate cancer in her father; Stroke in her paternal grandmother.  Review of Systems  Constitutional: Negative for weight gain and weight loss.  Cardiovascular:  Positive for leg swelling. Negative for chest pain, dyspnea on exertion, irregular heartbeat, orthopnea, palpitations and syncope.  Respiratory:  Positive for shortness of breath (chronic). Negative for cough.   Endocrine: Positive for heat intolerance.   Musculoskeletal:  Positive for joint pain (left shoulder/arm).  Genitourinary:  Positive for bladder incontinence.  All other systems reviewed and are negative.    EKGs/Labs/Other Test Reviewed:    EKG:  EKG is ordered today.  The ekg ordered today demonstrates sinus rhythm with incomplete RBBB. Significant baseline wandering likely due to tachypnea upon arrival. No acute ischemic changes noted.    Recent Labs: 01/20/2023: ALT 6; BUN 12; Creatinine, Ser 0.70; Hemoglobin 12.6; Platelets 215; Potassium 4.3; Sodium 141   Recent Lipid Panel Recent Labs    01/20/23 1218  LDLDIRECT 69      Risk Assessment/Calculations/Metrics:         HYPERTENSION CONTROL Vitals:   01/20/23 1136 01/20/23 1249  BP: (!) 190/100 (!) 160/80    The patient's blood pressure is elevated above target today.  In order to address the patient's elevated BP: A current anti-hypertensive medication was adjusted today.       Physical Exam:    VS:  BP (!) 160/80 (BP Location: Left Arm, Patient Position: Sitting)  Pulse 61   Ht 5' 2.5" (1.588 m)   Wt 207 lb 3.2 oz (94 kg)   SpO2 97%   BMI 37.29 kg/m     Wt Readings from Last 3 Encounters:  01/20/23 207 lb 3.2 oz (94 kg)  11/16/22 199 lb 6.4 oz (90.4 kg)  09/01/22 197 lb 3.2 oz (89.4 kg)    Constitutional:      Appearance: Healthy appearance. Not in distress.  Eyes:     Pupils: Pupils are equal, round, and reactive to light.  Neck:     Vascular: No JVR. JVD normal.  Pulmonary:     Effort: Pulmonary effort is normal.     Breath sounds: Normal breath sounds. No wheezing. No rhonchi. No rales.  Chest:     Chest wall: Not tender to palpatation.  Cardiovascular:     PMI at left midclavicular line. Normal rate. Regular rhythm. Normal S1. Normal S2.      Murmurs: There is no murmur.     No gallop.  No click. No rub.  Pulses:    Intact distal pulses.  Edema:    Peripheral edema present.    Feet: bilateral 1+ edema of the feet. Abdominal:      General: Bowel sounds are normal.     Palpations: Abdomen is soft.     Tenderness: There is no abdominal tenderness.  Musculoskeletal: Normal range of motion.        General: No tenderness. Skin:    General: Skin is warm and dry.  Neurological:     General: No focal deficit present.     Mental Status: Alert and oriented to person, place and time.         ASSESSMENT & PLAN:   Enlarged pulmonary artery (HCC) Patient with PA diameter 4.1cm on 2022 high res lung CT. May 2023 TTE with report of normal pulmonary artery systolic pressure. The estimated right ventricular systolic pressure was 28.6 mmHg. Repeat high res CT in 2024 showed overall stable appearing PA, diameter 4.0. Will update echocardiogram.   Chronic respiratory failure with hypoxia (HCC) Patient initially appeared short of breath and was found with O2 saturation of 79% upon being roomed after walking from the parking deck to the office. She has been recommended to be on oxygen continuously at a flow of 3 LPM by pulmonology. After being placed on this flow in office, O2 saturation quickly recovered to 98% and patient no longer appeared short of breath. I had a thorough discussion with patient today that she needs to begin wearing oxygen at all times, not just at night or with exertion. She was transported back to her vehicle via wheelchair at the end of our visit today. O2 saturation checked on room air prior to leaving, stable at 88%. Instructed her to put on O2 upon arriving home.   Essential hypertension BP not at goal today. Initially 190/100 mmHg, improved to 160/80 mmHg on repeat check. Will increase Losartan to 100mg  QD. Patient also asked to check daily BP at home (has automatic cuff). Check metabolic panel today. Will see patient again in 1 month following echocardiogram.  (HFpEF) heart failure with preserved ejection fraction [facility]) Patient with grade I diastolic dysfunction, impaired relaxation with LVEF 55% per March 2023  TTE. No evidence of pulmonary edema on physical exam. Trace to 1+ bilateral lower extremity edema in feet. Increase Losartan to 100mg  QD given hypertension today in clinic. Patient previously intolerant to [place] and is having incontinence issues secondary to daily  lasix so will defer use of MRA at this time. Continue Lasix 20mg  PRN for edema. Will update echocardiogram.  Hypersensitivity pneumonitis [facility]) Patient followed by pulmonology, Dr. Isaiah Serge. Instructed patient to wear O2 continuously as above.  Hyperlipidemia LDL goal <100 LDL 76 as of April 2022. Check direct LDL today. Continue Atorvastatin 20mg .             Dispo:  Return in about 1 month (around 02/19/2023) for Close follow up in 1 months with Perlie Gold, PA-C.   Medication Adjustments/Labs and Tests Ordered: Current medicines are reviewed at length with the patient today.  Concerns regarding medicines are outlined above.  Tests Ordered: Orders Placed This Encounter  Procedures   CBC   Comp Met (CMET)   Direct LDL   EKG 12-Lead   ECHOCARDIOGRAM COMPLETE   Medication Changes: Meds ordered this encounter  Medications   losartan (COZAAR) 100 MG tablet    Sig: Take 1 tablet (100 mg total) by mouth daily.    Dispense:  90 tablet    Refill:  3   Signed, Perlie Gold, PA-C  01/21/2023 12:08 PM    [facility] [address] [place], [place], [place]  [address] Phone: [phone]; Fax: [phone]

## 2023-01-19 NOTE — Assessment & Plan Note (Signed)
Patient with PA diameter 4.1cm on 2022 high res lung CT. May 2023 TTE with report of normal pulmonary artery systolic pressure. The estimated right ventricular systolic pressure was 28.6 mmHg. Repeat high res CT in 2024 showed overall stable appearing PA, diameter 4.0. Will update echocardiogram.

## 2023-01-20 ENCOUNTER — Encounter: Payer: Self-pay | Admitting: Cardiology

## 2023-01-20 ENCOUNTER — Ambulatory Visit: Payer: PPO | Attending: Cardiology | Admitting: Cardiology

## 2023-01-20 VITALS — BP 160/80 | HR 61 | Ht 62.5 in | Wt 207.2 lb

## 2023-01-20 DIAGNOSIS — J679 Hypersensitivity pneumonitis due to unspecified organic dust: Secondary | ICD-10-CM

## 2023-01-20 DIAGNOSIS — J9611 Chronic respiratory failure with hypoxia: Secondary | ICD-10-CM

## 2023-01-20 DIAGNOSIS — I10 Essential (primary) hypertension: Secondary | ICD-10-CM | POA: Diagnosis not present

## 2023-01-20 DIAGNOSIS — I5030 Unspecified diastolic (congestive) heart failure: Secondary | ICD-10-CM | POA: Insufficient documentation

## 2023-01-20 DIAGNOSIS — I5032 Chronic diastolic (congestive) heart failure: Secondary | ICD-10-CM | POA: Diagnosis not present

## 2023-01-20 DIAGNOSIS — E785 Hyperlipidemia, unspecified: Secondary | ICD-10-CM

## 2023-01-20 DIAGNOSIS — I288 Other diseases of pulmonary vessels: Secondary | ICD-10-CM | POA: Diagnosis not present

## 2023-01-20 DIAGNOSIS — E782 Mixed hyperlipidemia: Secondary | ICD-10-CM

## 2023-01-20 DIAGNOSIS — R0602 Shortness of breath: Secondary | ICD-10-CM

## 2023-01-20 MED ORDER — LOSARTAN POTASSIUM 100 MG PO TABS: 100.0000 mg | ORAL_TABLET | Freq: Every day | ORAL | 3 refills | Status: DC

## 2023-01-20 NOTE — Assessment & Plan Note (Signed)
Patient followed by pulmonology, Dr. Isaiah Serge. Instructed patient to wear O2 continuously as above.

## 2023-01-20 NOTE — Assessment & Plan Note (Signed)
Patient initially appeared short of breath and was found with O2 saturation of 79% upon being roomed after walking from the parking deck to the office. She has been recommended to be on oxygen continuously at a flow of 3 LPM by pulmonology. After being placed on this flow in office, O2 saturation quickly recovered to 98% and patient no longer appeared short of breath. I had a thorough discussion with patient today that she needs to begin wearing oxygen at all times, not just at night or with exertion. She was transported back to her vehicle via wheelchair at the end of our visit today. O2 saturation checked on room air prior to leaving, stable at 88%. Instructed her to put on O2 upon arriving home.

## 2023-01-20 NOTE — Assessment & Plan Note (Signed)
LDL 76 as of April 2022. Check direct LDL today. Continue Atorvastatin 20mg .

## 2023-01-20 NOTE — Assessment & Plan Note (Signed)
Patient with grade I diastolic dysfunction, impaired relaxation with LVEF 55% per March 2023 TTE. No evidence of pulmonary edema on physical exam. Trace to 1+ bilateral lower extremity edema in feet. Increase Losartan to 100mg  QD given hypertension today in clinic. Patient previously intolerant to [place] and is having incontinence issues secondary to daily lasix so will defer use of MRA at this time. Continue Lasix 20mg  PRN for edema. Will update echocardiogram.

## 2023-01-20 NOTE — Assessment & Plan Note (Addendum)
BP not at goal today. Initially 190/100 mmHg, improved to 160/80 mmHg on repeat check. Will increase Losartan to 100mg  QD. Patient also asked to check daily BP at home (has automatic cuff). Check metabolic panel today. Will see patient again in 1 month following echocardiogram.

## 2023-01-20 NOTE — Patient Instructions (Signed)
Medication Instructions:   INCREASE Losartan one (1) tablet by mouth ( 100 mg) daily.  You can take two (2) of your ( 50 mg) tablets at the same time and use them up.  *If you need a refill on your cardiac medications before your next appointment, please call your pharmacy*   Lab Work:  TODAY!!!! CBC/CMET/DIRECT LDL  If you have labs (blood work) drawn today and your tests are completely normal, you will receive your results only by: MyChart Message (if you have MyChart) OR A paper copy in the mail If you have any lab test that is abnormal or we need to change your treatment, we will call you to review the results.   Testing/Procedures:  Your physician has requested that you have an echocardiogram. Echocardiography is a painless test that uses sound waves to create images of your heart. It provides your doctor with information about the size and shape of your heart and how well your heart's chambers and valves are working. This procedure takes approximately one hour. There are no restrictions for this procedure. Please do NOT wear cologne, perfume  or lotions (deodorant is allowed). Please arrive 15 minutes prior to your appointment time.    Follow-Up: At [facility], you and your health needs are our priority.  As part of our continuing mission to provide you with exceptional heart care, we have created designated Provider Care Teams.  These Care Teams include your primary Cardiologist (physician) and Advanced Practice Providers (APPs -  Physician Assistants and Nurse Practitioners) who all work together to provide you with the care you need, when you need it.  We recommend signing up for the patient portal called "MyChart".  Sign up information is provided on this After Visit Summary.  MyChart is used to connect with patients for Virtual Visits (Telemedicine).  Patients are able to view lab/test results, encounter notes, upcoming appointments, etc.  Non-urgent messages can be  sent to your provider as well.   To learn more about what you can do with MyChart, go to [email or web address].    Your next appointment:   1 month(s)  Provider:   Perlie Gold, PA-C         Other Instructions  Please wear your oxygen continuously.

## 2023-01-21 ENCOUNTER — Telehealth: Payer: Self-pay | Admitting: Pulmonary Disease

## 2023-01-21 DIAGNOSIS — I700 Atherosclerosis of aorta: Secondary | ICD-10-CM | POA: Diagnosis not present

## 2023-01-21 DIAGNOSIS — I288 Other diseases of pulmonary vessels: Secondary | ICD-10-CM | POA: Diagnosis not present

## 2023-01-21 DIAGNOSIS — Z794 Long term (current) use of insulin: Secondary | ICD-10-CM | POA: Diagnosis not present

## 2023-01-21 DIAGNOSIS — I110 Hypertensive heart disease with heart failure: Secondary | ICD-10-CM | POA: Diagnosis not present

## 2023-01-21 DIAGNOSIS — J449 Chronic obstructive pulmonary disease, unspecified: Secondary | ICD-10-CM | POA: Diagnosis not present

## 2023-01-21 DIAGNOSIS — K219 Gastro-esophageal reflux disease without esophagitis: Secondary | ICD-10-CM | POA: Diagnosis not present

## 2023-01-21 DIAGNOSIS — E1165 Type 2 diabetes mellitus with hyperglycemia: Secondary | ICD-10-CM | POA: Diagnosis not present

## 2023-01-21 DIAGNOSIS — J439 Emphysema, unspecified: Secondary | ICD-10-CM | POA: Diagnosis not present

## 2023-01-21 DIAGNOSIS — R682 Dry mouth, unspecified: Secondary | ICD-10-CM | POA: Diagnosis not present

## 2023-01-21 DIAGNOSIS — J9611 Chronic respiratory failure with hypoxia: Secondary | ICD-10-CM | POA: Diagnosis not present

## 2023-01-21 DIAGNOSIS — I5032 Chronic diastolic (congestive) heart failure: Secondary | ICD-10-CM | POA: Diagnosis not present

## 2023-01-21 DIAGNOSIS — E559 Vitamin D deficiency, unspecified: Secondary | ICD-10-CM | POA: Diagnosis not present

## 2023-01-21 DIAGNOSIS — E1140 Type 2 diabetes mellitus with diabetic neuropathy, unspecified: Secondary | ICD-10-CM | POA: Diagnosis not present

## 2023-01-21 LAB — COMPREHENSIVE METABOLIC PANEL
ALT: 6 IU/L (ref 0–32)
AST: 12 IU/L (ref 0–40)
Albumin/Globulin Ratio: 1.7
Albumin: 4 g/dL (ref 3.7–4.7)
Alkaline Phosphatase: 128 IU/L — ABNORMAL HIGH (ref 44–121)
BUN/Creatinine Ratio: 17 (ref 12–28)
BUN: 12 mg/dL (ref 8–27)
Bilirubin Total: 0.4 mg/dL (ref 0.0–1.2)
CO2: 28 mmol/L (ref 20–29)
Calcium: 9.1 mg/dL (ref 8.7–10.3)
Chloride: 104 mmol/L (ref 96–106)
Creatinine, Ser: 0.7 mg/dL (ref 0.57–1.00)
Globulin, Total: 2.3 g/dL (ref 1.5–4.5)
Glucose: 100 mg/dL — ABNORMAL HIGH (ref 70–99)
Potassium: 4.3 mmol/L (ref 3.5–5.2)
Sodium: 141 mmol/L (ref 134–144)
Total Protein: 6.3 g/dL (ref 6.0–8.5)
eGFR: 85 mL/min/{1.73_m2} (ref >59)

## 2023-01-21 LAB — CBC
Hematocrit: 38.1 % (ref 34.0–46.6)
Hemoglobin: 12.6 g/dL (ref 11.1–15.9)
MCH: 29.9 pg (ref 26.6–33.0)
MCHC: 33.1 g/dL (ref 31.5–35.7)
MCV: 90 fL (ref 79–97)
Platelets: 215 10*3/uL (ref 150–450)
RBC: 4.22 x10E6/uL (ref 3.77–5.28)
RDW: 13.2 % (ref 11.7–15.4)
WBC: 6.1 10*3/uL (ref 3.4–10.8)

## 2023-01-21 LAB — LDL CHOLESTEROL, DIRECT: LDL Direct: 69 mg/dL (ref 0–99)

## 2023-01-21 NOTE — Telephone Encounter (Signed)
PT calling because the O2 levels drop when she is carrying her [place] O2. Is there something lighter she can get?  Please call to advise @ [phone]

## 2023-02-04 NOTE — Telephone Encounter (Signed)
Two weeks old. Please call PT. TY.

## 2023-02-05 NOTE — Telephone Encounter (Signed)
Listed number in message [phone], is not correct patient number. ATC patient x's 2 at listed home number [phone], busy signal only. ATC patient at listed mobile number [phone], no answer and VM has not been set up at this time.

## 2023-02-17 ENCOUNTER — Ambulatory Visit (HOSPITAL_COMMUNITY): Payer: PPO | Attending: Internal Medicine

## 2023-02-17 DIAGNOSIS — E782 Mixed hyperlipidemia: Secondary | ICD-10-CM | POA: Insufficient documentation

## 2023-02-17 DIAGNOSIS — I288 Other diseases of pulmonary vessels: Secondary | ICD-10-CM | POA: Diagnosis not present

## 2023-02-17 DIAGNOSIS — R0602 Shortness of breath: Secondary | ICD-10-CM | POA: Diagnosis not present

## 2023-02-17 DIAGNOSIS — I10 Essential (primary) hypertension: Secondary | ICD-10-CM | POA: Diagnosis not present

## 2023-02-17 DIAGNOSIS — J679 Hypersensitivity pneumonitis due to unspecified organic dust: Secondary | ICD-10-CM | POA: Diagnosis not present

## 2023-02-17 LAB — ECHOCARDIOGRAM COMPLETE
Area-P 1/2: 2.54 cm2
S' Lateral: 2.9 cm

## 2023-02-26 ENCOUNTER — Ambulatory Visit: Payer: PPO | Attending: Cardiology | Admitting: Cardiology

## 2023-02-26 ENCOUNTER — Encounter: Payer: Self-pay | Admitting: Cardiology

## 2023-02-26 VITALS — BP 138/82 | HR 70 | Ht 62.0 in | Wt 203.8 lb

## 2023-02-26 DIAGNOSIS — I288 Other diseases of pulmonary vessels: Secondary | ICD-10-CM | POA: Diagnosis not present

## 2023-02-26 DIAGNOSIS — E785 Hyperlipidemia, unspecified: Secondary | ICD-10-CM | POA: Diagnosis not present

## 2023-02-26 DIAGNOSIS — J679 Hypersensitivity pneumonitis due to unspecified organic dust: Secondary | ICD-10-CM

## 2023-02-26 DIAGNOSIS — I5032 Chronic diastolic (congestive) heart failure: Secondary | ICD-10-CM

## 2023-02-26 DIAGNOSIS — I10 Essential (primary) hypertension: Secondary | ICD-10-CM | POA: Diagnosis not present

## 2023-02-26 MED ORDER — SPIRONOLACTONE 25 MG PO TABS: 25.0000 mg | ORAL_TABLET | Freq: Every day | ORAL | 3 refills | Status: DC

## 2023-02-26 NOTE — Assessment & Plan Note (Signed)
Patient doing better with continuous use of oxygen. I've encouraged her to continue with close follow up with DR. Mannam with pulmonology.

## 2023-02-26 NOTE — Assessment & Plan Note (Signed)
Patient with PA diameter 4.1cm on 2022 high res lung CT. May 2023 TTE with report of normal pulmonary artery systolic pressure. The estimated right ventricular systolic pressure was 28.6 mmHg. Repeat high res CT in 2024 showed overall stable appearing PA, diameter 4.0. Repeat TTE on 02/17/23 with normal RV function and size, no apparent evidence of change in PA size.

## 2023-02-26 NOTE — Assessment & Plan Note (Addendum)
02/17/23 TTE showed left ventricular ejection fraction, by estimation, is 60 to 65%. The left ventricle has normal function. The left ventricle has no regional wall motion abnormalities. There is mild concentric left ventricular hypertrophy. Left ventricular diastolic parameters are consistent with Grade I diastolic dysfunction. Clinically today patient appears . Continue Lasix 20mg  PRN, Losartan 100mg  every day. Intolerant to [place]. Start Spironolactone 25mg . Stop potassium. Check BMP on 7/22 or 7/23.

## 2023-02-26 NOTE — Assessment & Plan Note (Addendum)
BP elevated at last OV and patient's Losartan was increase to 100mg . Since this change, blood pressure has been improved but remains above goal, recent home readings 140s/80s. Will add Spironolactone 25mg  given concurrent HFpEF. Check BMP on 7/22 or 7/23. Stop potassium supplementation. Continue Losartan 100mg .

## 2023-02-26 NOTE — Patient Instructions (Signed)
Medication Instructions:  Your physician has recommended you make the following change in your medication:   Stop taking potassium. 2    Start taking spironolactone 25 mg daily  *If you need a refill on your cardiac medications before your next appointment, please call your pharmacy*   Lab Work: BMET - Monday or Tuesday If you have labs (blood work) drawn today and your tests are completely normal, you will receive your results only by: MyChart Message (if you have MyChart) OR A paper copy in the mail If you have any lab test that is abnormal or we need to change your treatment, we will call you to review the results.   Follow-Up: At [facility], you and your health needs are our priority.  As part of our continuing mission to provide you with exceptional heart care, we have created designated Provider Care Teams.  These Care Teams include your primary Cardiologist (physician) and Advanced Practice Providers (APPs -  Physician Assistants and Nurse Practitioners) who all work together to provide you with the care you need, when you need it.  We recommend signing up for the patient portal called "MyChart".  Sign up information is provided on this After Visit Summary.  MyChart is used to connect with patients for Virtual Visits (Telemedicine).  Patients are able to view lab/test results, encounter notes, upcoming appointments, etc.  Non-urgent messages can be sent to your provider as well.   To learn more about what you can do with MyChart, go to [email or web address].    Your next appointment:   3 month(s)  Provider:   Perlie Gold, PA-C

## 2023-02-26 NOTE — Progress Notes (Unsigned)
Cardiology Office Note:   Date:  02/26/2023  ID:  Cheryl Brown, DOB 06-28-1936, MRN [id number] PCP: Thana Ates, MD  [facility] Providers Cardiologist:  Tonny Bollman, MD { Click to update primary MD,subspecialty MD or APP then REFRESH:1}   History of Present Illness:   Cheryl Brown is a 87 y.o. female with history of hypertension, hyperlipidemia, hypersensitivity pneumonitis, COPD, OSA (intolerant of CPAP), restless leg syndrome. She is accompanied by her daughter today.   Patient last seen 6/12 for follow up after a CT ordered by pulmonology was concerning for "heart strain." At that visit, patient noted to have worsening dyspnea, easy shortness of breath activity around the house. She reported that she was only wearing O2 at night with PRN daytime use, primarily because the weight of her condenser makes regular use difficult. Since this visit, patient continues with chronic dyspnea, rapid O2 desaturation when off of oxygen (bath/shower, etc) but is wearing O2 all the time. When using her condenser or O2 tank at home, she manages small amounts of exertion and keeps O2 over 88%. Home blood pressures have been improved but still elevated, per daughter also at visit today typically 140s/80s. She continues to take lasix every day except for when she will be away from home for extended periods of time due to urinary urgency caused by lasix. Continues with some mild lower extremity edema that she feels is stable. No change in orthopnea, does fine as long as she lays on her side.   Her husband has recently transitioned to hospice care and this continues to be a stressor for her. They do have the support of nearby family and her daughter has recently moved in to help provide care. Per patient, her husband has begun to decline quite rapidly now.  Today patient denies chest pain, palpitations, melena, hematuria, hemoptysis, diaphoresis, weakness, presyncope, syncope, and PND.   Studies  Reviewed:    EKG:        No new ECG  Risk Assessment/Calculations:              Physical Exam:   VS:  BP 138/82 (BP Location: Right Arm, Patient Position: Sitting)   Pulse 70   Ht 5\' 2"  (1.575 m)   Wt 203 lb 12.8 oz (92.4 kg)   SpO2 93%   BMI 37.28 kg/m    Wt Readings from Last 3 Encounters:  02/26/23 203 lb 12.8 oz (92.4 kg)  01/20/23 207 lb 3.2 oz (94 kg)  11/16/22 199 lb 6.4 oz (90.4 kg)     Physical Exam Constitutional:      Appearance: Normal appearance.     Comments: On oxygen at 3LPM  HENT:     Head: Normocephalic.     Nose: Nose normal.  Cardiovascular:     Rate and Rhythm: Normal rate and regular rhythm.     Pulses: Normal pulses.     Heart sounds: Normal heart sounds.  Pulmonary:     Effort: Pulmonary effort is normal. No respiratory distress.     Breath sounds: Normal breath sounds. No wheezing.  Abdominal:     General: Abdomen is flat.     Palpations: Abdomen is soft.  Musculoskeletal:     Right lower leg: Edema (trace to 1+) present.     Left lower leg: Edema (trace to 1+) present.  Skin:    General: Skin is warm and dry.     Capillary Refill: Capillary refill takes less than 2 seconds.  Neurological:  General: No focal deficit present.     Mental Status: She is alert and oriented to person, place, and time.  Psychiatric:        Mood and Affect: Mood normal.        Behavior: Behavior normal.        Thought Content: Thought content normal.        Judgment: Judgment normal.      ASSESSMENT AND PLAN:   Enlarged pulmonary artery (HCC) Patient with PA diameter 4.1cm on 2022 high res lung CT. May 2023 TTE with report of normal pulmonary artery systolic pressure. The estimated right ventricular systolic pressure was 28.6 mmHg. Repeat high res CT in 2024 showed overall stable appearing PA, diameter 4.0. Repeat TTE on 02/17/23 with normal RV function and size, no apparent evidence of change in PA size.  (HFpEF) heart failure with preserved  ejection fraction (HCC) 02/17/23 TTE showed left ventricular ejection fraction, by estimation, is 60 to 65%. The left ventricle has normal function. The left ventricle has no regional wall motion abnormalities. There is mild concentric left ventricular hypertrophy. Left ventricular diastolic parameters are consistent with Grade I diastolic dysfunction. Clinically today patient appears . Continue Lasix 20mg  PRN, Losartan 100mg  every day. Intolerant to [place]. Start Spironolactone 25mg . Stop potassium. Check BMP on 7/22 or 7/23.  Hyperlipidemia LDL goal <100 LDL 69 as of 01/20/23. Continue Atorvastatin 20mg .  Essential hypertension BP elevated at last OV and patient's Losartan was increase to 100mg . Since this change, blood pressure has been improved but remains above goal, recent home readings 140s/80s. Will add Spironolactone 25mg  given concurrent HFpEF. Check BMP on 7/22 or 7/23. Stop potassium supplementation. Continue Losartan 100mg .   Hypersensitivity pneumonitis [facility]) Patient doing better with continuous use of oxygen. I've encouraged her to continue with close follow up with DR. Mannam with pulmonology.           Signed, Perlie Gold, PA-C

## 2023-02-26 NOTE — Assessment & Plan Note (Signed)
LDL 69 as of 01/20/23. Continue Atorvastatin 20mg .

## 2023-03-01 ENCOUNTER — Ambulatory Visit: Payer: PPO | Attending: Cardiology

## 2023-03-01 DIAGNOSIS — E785 Hyperlipidemia, unspecified: Secondary | ICD-10-CM

## 2023-03-01 DIAGNOSIS — J679 Hypersensitivity pneumonitis due to unspecified organic dust: Secondary | ICD-10-CM | POA: Diagnosis not present

## 2023-03-01 DIAGNOSIS — I10 Essential (primary) hypertension: Secondary | ICD-10-CM | POA: Diagnosis not present

## 2023-03-01 DIAGNOSIS — I288 Other diseases of pulmonary vessels: Secondary | ICD-10-CM

## 2023-03-01 DIAGNOSIS — I5032 Chronic diastolic (congestive) heart failure: Secondary | ICD-10-CM | POA: Diagnosis not present

## 2023-03-02 LAB — BASIC METABOLIC PANEL
BUN/Creatinine Ratio: 19 (ref 12–28)
BUN: 16 mg/dL (ref 8–27)
CO2: 28 mmol/L (ref 20–29)
Calcium: 9.6 mg/dL (ref 8.7–10.3)
Chloride: 96 mmol/L (ref 96–106)
Creatinine, Ser: 0.85 mg/dL (ref 0.57–1.00)
Glucose: 181 mg/dL — ABNORMAL HIGH (ref 70–99)
Potassium: 4 mmol/L (ref 3.5–5.2)
Sodium: 139 mmol/L (ref 134–144)
eGFR: 68 mL/min/{1.73_m2} (ref >59)

## 2023-03-10 DIAGNOSIS — H353211 Exudative age-related macular degeneration, right eye, with active choroidal neovascularization: Secondary | ICD-10-CM | POA: Diagnosis not present

## 2023-03-10 DIAGNOSIS — H43811 Vitreous degeneration, right eye: Secondary | ICD-10-CM | POA: Diagnosis not present

## 2023-03-10 DIAGNOSIS — H4322 Crystalline deposits in vitreous body, left eye: Secondary | ICD-10-CM | POA: Diagnosis not present

## 2023-03-10 DIAGNOSIS — H353222 Exudative age-related macular degeneration, left eye, with inactive choroidal neovascularization: Secondary | ICD-10-CM | POA: Diagnosis not present

## 2023-03-10 DIAGNOSIS — H43822 Vitreomacular adhesion, left eye: Secondary | ICD-10-CM | POA: Diagnosis not present

## 2023-03-10 DIAGNOSIS — H35371 Puckering of macula, right eye: Secondary | ICD-10-CM | POA: Diagnosis not present

## 2023-04-09 DIAGNOSIS — M5416 Radiculopathy, lumbar region: Secondary | ICD-10-CM | POA: Diagnosis not present

## 2023-04-09 DIAGNOSIS — G2581 Restless legs syndrome: Secondary | ICD-10-CM | POA: Diagnosis not present

## 2023-05-05 DIAGNOSIS — H35371 Puckering of macula, right eye: Secondary | ICD-10-CM | POA: Diagnosis not present

## 2023-05-05 DIAGNOSIS — H43822 Vitreomacular adhesion, left eye: Secondary | ICD-10-CM | POA: Diagnosis not present

## 2023-05-05 DIAGNOSIS — H43811 Vitreous degeneration, right eye: Secondary | ICD-10-CM | POA: Diagnosis not present

## 2023-05-05 DIAGNOSIS — H353222 Exudative age-related macular degeneration, left eye, with inactive choroidal neovascularization: Secondary | ICD-10-CM | POA: Diagnosis not present

## 2023-05-05 DIAGNOSIS — H4322 Crystalline deposits in vitreous body, left eye: Secondary | ICD-10-CM | POA: Diagnosis not present

## 2023-05-05 DIAGNOSIS — H353211 Exudative age-related macular degeneration, right eye, with active choroidal neovascularization: Secondary | ICD-10-CM | POA: Diagnosis not present

## 2023-05-11 ENCOUNTER — Telehealth: Payer: Self-pay | Admitting: Pulmonary Disease

## 2023-05-11 DIAGNOSIS — J4489 Other specified chronic obstructive pulmonary disease: Secondary | ICD-10-CM

## 2023-05-11 DIAGNOSIS — J849 Interstitial pulmonary disease, unspecified: Secondary | ICD-10-CM

## 2023-05-11 NOTE — Telephone Encounter (Signed)
Pt calling in to get a a smaller oxygen the one she has is to heavy  [phone]

## 2023-05-17 NOTE — Telephone Encounter (Signed)
Referral to [employer] placed.  Patient aware.

## 2023-05-17 NOTE — Telephone Encounter (Signed)
Patient would like POC. States the one she has is making noise. Patient phone number is [phone].

## 2023-05-19 ENCOUNTER — Telehealth: Payer: Self-pay | Admitting: Pulmonary Disease

## 2023-05-19 NOTE — Telephone Encounter (Signed)
Council Mechanic Tomie China; Angus Seller, [place] Good afternoon, This patient has reach her 5 year billing cap. We will need to get her requalified. A New RX, Sats and face to face notes.  Please have nurse call with any question. [phone].  Thank you.

## 2023-05-21 NOTE — Telephone Encounter (Signed)
Called and spoke with pt letting her know the message from [employer] and she verbalized understanding. Pt has been scheduled for an appt with Dr. Isaiah Serge 10/24. Nothing further needed.

## 2023-06-03 ENCOUNTER — Ambulatory Visit: Payer: PPO | Admitting: Pulmonary Disease

## 2023-06-03 ENCOUNTER — Encounter: Payer: Self-pay | Admitting: Pulmonary Disease

## 2023-06-03 VITALS — BP 134/70 | HR 75 | Ht 60.0 in | Wt 202.2 lb

## 2023-06-03 DIAGNOSIS — J9611 Chronic respiratory failure with hypoxia: Secondary | ICD-10-CM

## 2023-06-03 DIAGNOSIS — J849 Interstitial pulmonary disease, unspecified: Secondary | ICD-10-CM

## 2023-06-03 NOTE — Progress Notes (Signed)
Cheryl Brown    [id number]    [birth year]  Primary Care Physician:Raju, Dorris Singh, MD  Referring Physician: Thana Ates, MD [address] [place],  [place] [address]  Chief complaint: Follow up for emphysema, interstitial lung disease.  HPI: 87 y.o. with history of hypertension, diabetes, COPD, OSA (noncompliant with CPAP].   She was diagnosed with COPD in 2012.  Has dyspnea with rest, chronic cough with white mucus, wheezing, occasional fevers and chills at night.  She does not have seasonal allergies. + for acid reflux and indigestion.   Diagnosed with sleep apnea.  She has been intolerant of CPAP and is not interested in trying again. History noted for elevated ANA with positive ro antibody.  She was referred to Dr. Dierdre Forth in August 2018 but was told she did not have any rheumatologic issue.  She continues to have dry mouth, dry eyes.  Denies any joint pain, rash.  Office note from Dr. Dierdre Forth, rheumatology dated 05/20/17 Evaluated for positive ANA, SSA. Thought to be false-positive in spite of having xerostomia.  No systemic immunosuppression reccomended as there are no extraglandular manifestation.  Underwent bronchoscopy on 06/13/2019 and discussion at multidisciplinary conference on 06/20/2019 with diagnosis of hypersensitivity pneumonitis with moderate confidence. She was started on prednisone on 06/28/19 at 40 mg which was then tapered to 20 mg in 07/26/2019. Came off the prednisone in January 2020.  She did not like being on the steroids which caused weight gain and she cannot tell if it made any difference with her breathing  Diagnosed with COVID-19 in January 2020.  She did not need admission and self isolated at home  Pets: No pets Occupation: Retired from [employer] in 1979 Exposures: No known exposures, no mold, hot tub. ILD questionnaire-Negative for exposures. Smoking history: 30-pack-year smoking history.  Quit in 1988 Travel History:  Lived in [place] all her life.  No significant travel history.  Interim history: Discussed the use of AI scribe software for clinical note transcription with the patient, who gave verbal consent to proceed.  The patient, with a history of age-related macular degeneration and interstitial lung disease, presents with concerns about her oxygen concentrator. She reports that the device, which she has had for over five years, is noisy, gets hot, and is not working as well as it used to. She expresses a desire for a new device, as the current one is not suitable for use in public places like church due to its noise level.  In addition to the issues with the oxygen concentrator, the patient also discusses her ongoing treatment for macular degeneration. She is receiving Avastin injections in one eye, but the other eye is not suitable for this treatment. She expresses frustration with the cost of the injections.  Regarding her interstitial lung disease, the patient reports that her breathing is okay when she is on oxygen, but she experiences headaches and lightheadedness when she is not. She has an albuterol inhaler, but she uses it infrequently due to nervousness. Her last CT scan was in March and showed stable disease.   Outpatient Encounter Medications as of 06/03/2023  Medication Sig   albuterol (VENTOLIN HFA) 108 (90 Base) MCG/ACT inhaler Inhale 2 puffs into the lungs every 6 (six) hours as needed.   amitriptyline (ELAVIL) 50 MG tablet Take 50 mg by mouth at bedtime.   atorvastatin (LIPITOR) 20 MG tablet Take 20 mg by mouth daily.   BD PEN NEEDLE  NANO U/F 32G X 4 MM MISC use as directed once daily   betamethasone valerate (VALISONE) 0.1 % cream APPLY A THIN LAYER TO AFFECTED AREA(S) TWICE A DAY FOR 2 WEEKS THEN USE 2 TO 3 TIMES A WEEK   estradiol (ESTRACE) 0.1 MG/GM vaginal cream Place 1 g vaginally 3 (three) times a week.   furosemide (LASIX) 20 MG tablet Take 1 tablet (20 mg total) by mouth daily.  (Patient taking differently: Take 20 mg by mouth as needed for fluid or edema.)   glimepiride (AMARYL) 2 MG tablet Take 4 mg by mouth daily.   glucose blood (ONE TOUCH ULTRA TEST) test strip 1 each by Other route daily. Use to check blood sugar daily Dx: E11.41   losartan (COZAAR) 100 MG tablet Take 1 tablet (100 mg total) by mouth daily.   magnesium hydroxide (MILK OF MAGNESIA) 400 MG/5ML suspension Take by mouth daily as needed for mild constipation.   omeprazole (PRILOSEC) 20 MG capsule Take 20 mg by mouth daily.   ONETOUCH DELICA LANCETS MISC Check blood sugar as directed   OXYGEN See admin instructions. Patient take 3 liters at bedtime and at daytime if needed   pramipexole (MIRAPEX) 0.5 MG tablet Take 0.5 mg by mouth at bedtime.   TOUJEO SOLOSTAR 300 UNIT/ML SOPN Inject 42 Units into the skin every evening.    spironolactone (ALDACTONE) 25 MG tablet Take 1 tablet (25 mg total) by mouth daily.   [DISCONTINUED] clotrimazole (LOTRIMIN) 1 % cream as needed.   [DISCONTINUED] tobramycin (TOBREX) 0.3 % ophthalmic solution as directed.   No facility-administered encounter medications on file as of 06/03/2023.   Physical Exam: Blood pressure 134/70, pulse 75, height 5' (1.524 m), weight 202 lb 3.2 oz (91.7 kg), SpO2 96%. Gen:      No acute distress HEENT:  EOMI, sclera anicteric Neck:     No masses; no thyromegaly Lungs:    Clear to auscultation bilaterally; normal respiratory effort CV:         Regular rate and rhythm; no murmurs Abd:      + bowel sounds; soft, non-tender; no palpable masses, no distension Ext:    No edema; adequate peripheral perfusion Skin:      Warm and dry; no rash Neuro: alert and oriented x 3 Psych: normal mood and affect   Data Reviewed: Imaging CT abdomen pelvis 08/28/16-bibasilar opacities, atelectasis Chest x-ray 08/28/16-coarse interstitial marking, nodular opacity in the left midlung region. Chest x-ray 09/10/17- stable coarse interstitial  markings. High-resolution CT chest 09/15/17- bronchial wall thickening with mild emphysema.  Diffuse groundglass attenuation, septal thickening, bronchiectasis with moderate air trapping.  High-resolution CT 04/14/18-mild emphysema.  Stable findings of groundglass opacities, septal thickening, bronchiectasis with air-trapping.  Indeterminate for UIP.  Left main and three-vessel coronary artery disease. High-resolution CT 05/19/2019-stable findings of interstitial lung disease as before.  Possible chronic HP.  With slight progression compared to 2019 CT high-resolution 12/27/2019-stable interstitial lung disease. High resolution CT 10/28/2022-stable interstitial lung disease I have reviewed the images personally.  PFTs  09/21/17 FVC 1.96 [79%], FEV1 1.64 [90%], F/F 84, TLC 73%, DLCO 12.43 [55%]   04/22/2018 FVC 2.0 [82%], FEV1 1.73 [96%], F/F 87, TLC 74%, DLCO 12.04 [54%]  01/23/2019 FVC 1.95 [80%], FEV1 1.56 [86%],/F 80, DLCO 10.92 [61%]  11/21/2019 FVC 1.77 [1 4%), FEV1 1.46 [82%], F/F 82, DLCO 9.11 [62%  12/13/2020 FVC 1.88 [80%], FEV1 1.55 [9%], F/F 83, TLC 3.18 [64%], DLCO 11.46 [64%]  04/29/22 FVC 1.77 [7 7%], FEV1  1.50 [88%], F/F 85, TLC 3.62 [75%], DLCO 10.30 [58%] Mild diffusion defect, mild restriction  FENO 09/10/17- 9  6-minute walk test 11/02/17-  Starting heart rate, sats-69, 93% Starting heart rate, sats-108, 94% Distance walked 408,  Patient stopped with 30 seconds left due to SOB and leg cramps.  6-minute walk 04/08/2020-408 m  Labs: 04/05/17-positive ANA, Ro antibody 1.4  Repeat labs 09/21/17 Blood allergy profile-negative, IgE 4 ILD panel-ANA negative, angiotensin-converting enzyme-86 Rheumatoid factor, CCP-negative Ro antibody 1.1 La negative Hypersensitivity panel-negative  Cardiac Echocardiogram 12/16/2021-LVEF 55%, grade 1 diastolic dysfunction, RV systolic function is normal.  Normal PA systolic pressure.  Estimated RVSP 28.6  Cardiac stress test 09/24/2016- low  risk study, EF 55-65%.  Bronchoscopy 06/13/2019 Cell count nucleated cells 213, 30% lymphs, 18% eos, 22% neutrophils, 30% monocyte macrophage Cultures -normal respiratory flora Transbronchial biopsy-isolated nonnecrotizing microgranuloma  Assessment:  Interstitial lung disease CT scan reviewed which shows nonspecific interstitial lung disease with air trapping.  Looks like chronic HP.  She does endorse exposure to hairspray which may be the allergen.  She has been evaluated by Dr. Dierdre Forth in the past for positive ANA, Ro antibody and was told that there is no evidence of rheumatologic issues.   With bronchoscope showing lymphocytosis, granuloma on path and possible exposure to hairspray this is high likelihood of being hypersensitivity pneumonitis. Not sure if there is additional underlying CTD associated ILD with positive serologies  I have asked her to avoid exposure to the hairspray and she has changed the brand. She has not tolerated prednisone.  We will continue monitoring for now.  If worsening on follow-up high-res CT then reconsider treatment plan.   Can consider CellCept as there is still a question of connective tissue disease but since she has been stable we have held off. PFTs and PFTs have been largely stable over the years.    -Continue current oxygen therapy. -Order follow-up CT in six months (April 2025). -Consider evaluation for updated oxygen concentrator due to noise and heat from current device.  Enlarged PA Has been noted over several years.  No evidence of pulmonary hypertension on echocardiogram in 2023  Emphysema PFTs shows mild emphysematous changes.  There is no evidence of obstruction on PFTs.  She is off Trelegy inhaler and continues supplemental oxygen at night.  GERD She has ongoing GERD and may have reflux contributing to ILD.   Continue prilosec  Age-Related Macular Degeneration Receiving Avastin injections for management. -Continue current treatment  with ophthalmologist Dr. Lelon Perla.    Plan/Recommendations: -High-res CT in 6 months  Chilton Greathouse MD [facility] 06/03/2023, 3:04 PM

## 2023-06-03 NOTE — Patient Instructions (Signed)
VISIT SUMMARY:  During today's visit, we discussed your concerns about your oxygen concentrator, ongoing treatment for macular degeneration, and management of interstitial lung disease. We reviewed your current treatments and made plans for follow-up care.  YOUR PLAN:  -AGE-RELATED MACULAR DEGENERATION: Age-related macular degeneration is an eye condition that can cause vision loss. You are currently receiving Avastin injections in one eye to manage this condition. Please continue your treatment with Dr. Lelon Perla as planned.  -INTERSTITIAL LUNG DISEASE: Interstitial lung disease is a group of disorders that cause scarring of the lungs, leading to breathing difficulties. Your last CT scan in March showed stable disease. Continue your current oxygen therapy. We will order a follow-up CT scan in six months (April 2025). We will also consider evaluating you for an updated oxygen concentrator due to the issues with your current device.  -GENERAL HEALTH MAINTENANCE: We will schedule a follow-up appointment in six months after your next CT scan. Please continue with your current treatments and follow any lifestyle recommendations provided.  INSTRUCTIONS:  Please schedule a follow-up appointment in six months after your next CT scan in April 2025.

## 2023-06-04 ENCOUNTER — Encounter: Payer: Self-pay | Admitting: Pulmonary Disease

## 2023-06-07 ENCOUNTER — Ambulatory Visit: Payer: PPO | Attending: Cardiology | Admitting: Cardiology

## 2023-06-07 ENCOUNTER — Encounter: Payer: Self-pay | Admitting: Cardiology

## 2023-06-07 VITALS — BP 120/72 | HR 76 | Ht 62.0 in | Wt 201.0 lb

## 2023-06-07 DIAGNOSIS — J9611 Chronic respiratory failure with hypoxia: Secondary | ICD-10-CM

## 2023-06-07 DIAGNOSIS — I5032 Chronic diastolic (congestive) heart failure: Secondary | ICD-10-CM

## 2023-06-07 DIAGNOSIS — I10 Essential (primary) hypertension: Secondary | ICD-10-CM | POA: Diagnosis not present

## 2023-06-07 DIAGNOSIS — E785 Hyperlipidemia, unspecified: Secondary | ICD-10-CM | POA: Diagnosis not present

## 2023-06-07 MED ORDER — LOSARTAN POTASSIUM 100 MG PO TABS: 50.0000 mg | ORAL_TABLET | Freq: Every day | ORAL | Status: DC

## 2023-06-07 NOTE — Progress Notes (Signed)
Cardiology Office Note:   Date:  06/07/2023  ID:  Cheryl Brown, DOB 06-09-1936, MRN [id number] PCP: Thana Ates, MD   [facility] Providers Cardiologist:  Tonny Bollman, MD    History of Present Illness:   Discussed the use of AI scribe software for clinical note transcription with the patient, who gave verbal consent to proceed.  History of Present Illness   The patient is an 87 year old female with a history of hypertension, hyperlipidemia, hypersensitivity pneumonitis, COPD, obstructive sleep apnea, and restless leg syndrome. She presents for follow up today with her daughter (patient just moved in with daughter).   The patient was seen for follow-up after a CT scan indicated potential heart strain earlier this year. The patient reported worsening dyspnea and shortness of breath with activity around the house. The patient was using oxygen primarily at night and as needed during the day due to the weight of the condenser.  Between June and July visits with me, the patient was using oxygen more consistently and had improvement in symptoms of dyspnea. With the use of the condenser O2 tank at home, the patient was able to manage small amounts of exertion and maintain O2 levels over 88%.  An echocardiogram in July revealed a left ventricular ejection fraction of 60-65% and grade one diastolic dysfunction. No significant valvular abnormalities were noted.  Today the patient reports checking blood pressure at home, with readings typically between in the 140s. 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 The patient also reports increased reliance on oxygen, experiencing headaches and near fainting spells when off oxygen for any significant length of time. The patient expressed concern about the progression of  her pneumonitis/O2 requirements and says she has been scheduled for a follow-up CT scan by her pulmonologist.   Regarding medications, patient says today that she has only been taking Losartan 50mg  despite this being increased back at her June visit with me. She has apparently been confused about the dosing. She has been compliant with spironolactone 25mg , and furosemide 20mg  daily (doesn't take Lasix on days with significant time away from the house due to incontinence concerns).   Aside from chronic and progressive dyspnea along with fatigue and dizziness (when off O2), patient denies: chest pain, lower extremity edema, palpitations, melena, hematuria, hemoptysis, diaphoresis, weakness, orthopnea, and PND.   Studies Reviewed:    02/17/23 TTE  IMPRESSIONS     1. Left ventricular ejection fraction, by estimation, is 60 to 65%. The  left ventricle has normal function. The left ventricle has no regional  wall motion abnormalities. There is mild concentric left ventricular  hypertrophy. Left ventricular diastolic  parameters are consistent with Grade I diastolic dysfunction (impaired  relaxation).   2. Right ventricular systolic function is normal. The right ventricular  size is normal.   3. No evidence of mitral valve regurgitation.   4. The aortic valve is tricuspid. Aortic valve regurgitation is not  visualized.   5. The inferior vena cava is normal in size with greater than 50%  respiratory variability, suggesting right atrial pressure of 3 mmHg.   Comparison(s): No significant change from prior study.   FINDINGS   Left Ventricle: Left ventricular ejection fraction, by estimation, is 60  to 65%. The left ventricle has normal function. The left ventricle has no  regional wall motion abnormalities.  The left ventricular internal cavity  size was normal in size. There is   mild concentric left ventricular hypertrophy. Left ventricular diastolic  parameters are consistent with Grade I  diastolic dysfunction (impaired  relaxation).   Right Ventricle: The right ventricular size is normal. Right ventricular  systolic function is normal.   Left Atrium: Left atrial size was normal in size.   Right Atrium: Right atrial size was normal in size.   Pericardium: There is no evidence of pericardial effusion.   Mitral Valve: Mild mitral annular calcification. No evidence of mitral  valve regurgitation.   Tricuspid Valve: Tricuspid valve regurgitation is not demonstrated.   Aortic Valve: The aortic valve is tricuspid. Aortic valve regurgitation is  not visualized.   Pulmonic Valve: The pulmonic valve was normal in structure. Pulmonic valve  regurgitation is not visualized.   Aorta: The aortic root and ascending aorta are structurally normal, with  no evidence of dilitation.   Venous: The inferior vena cava is normal in size with greater than 50%  respiratory variability, suggesting right atrial pressure of 3 mmHg.   IAS/Shunts: No atrial level shunt detected by color flow Doppler.    Risk Assessment/Calculations:              Physical Exam:   VS:  BP 120/72   Pulse 76   Ht 5\' 2"  (1.575 m)   Wt 201 lb (91.2 kg)   SpO2 96%   BMI 36.76 kg/m    Wt Readings from Last 3 Encounters:  06/07/23 201 lb (91.2 kg)  06/03/23 202 lb 3.2 oz (91.7 kg)  02/26/23 203 lb 12.8 oz (92.4 kg)     Physical Exam Vitals reviewed.  Constitutional:      Appearance: Normal appearance.  HENT:     Head: Normocephalic.     Nose: Nose normal.  Eyes:     Pupils: Pupils are equal, round, and reactive to light.  Cardiovascular:     Rate and Rhythm: Normal rate and regular rhythm.     Pulses: Normal pulses.     Heart sounds: Normal heart sounds. No murmur heard.    No friction rub. No gallop.  Pulmonary:     Effort: Pulmonary effort is normal.     Breath sounds: Normal breath sounds.  Abdominal:     General: Abdomen is flat.  Musculoskeletal:     Right lower leg: Edema (trace)  present.     Left lower leg: Edema (trace) present.  Skin:    General: Skin is warm and dry.     Capillary Refill: Capillary refill takes less than 2 seconds.  Neurological:     General: No focal deficit present.     Mental Status: She is alert and oriented to person, place, and time.  Psychiatric:        Mood and Affect: Mood normal.        Behavior: Behavior normal.        Thought Content: Thought content normal.        Judgment: Judgment normal.     ASSESSMENT AND PLAN:     Assessment and Plan    Hypertension Blood pressure readings at home in the 140s systolic, but in-office reading of 120/72 after settling. Patient has been taking Losartan 50mg  instead of the prescribed 100mg . -Continue Losartan 50mg  daily as blood pressure is controlled at this dose. -Continue Spironolactone 25mg  -Advise patient to compare home medication list with office list and to bring medication bottles to next visit.  Chronic Dyspnea Hypersensitivity Pneumonitis Patient reports worsening dyspnea and rapid O2 desaturation when off oxygen. Patient is now wearing oxygen all the time but is able to keep O2 over 88% even with small amounts of exertion (with the death of her spouse, has moved in with her daughter, is less active now).  -Continue current oxygen therapy. -Encourage patient to stay on oxygen as much as possible to prevent hypoxia and associated symptoms. -Continue to follow with pulmonology (working to get her a new portable O2 device). High res chest CT also pending.   (HFpEF) heart failure with preserved ejection fraction (HCC)  Diastolic Dysfunction Grade 1 diastolic dysfunction noted on recent echocardiogram. Preserved LVEF. Patient without clear symptoms of CHF, suspect chronic dyspnea is driven by pulmonary disease.  -Continue current antihypertensive regimen (Losartan 50mg  daily, Spironolactone 25mg  daily) to manage blood pressure.  Hyperlipidemia Last cholesterol level was well  controlled at 69 in June. -Continue Atorvastatin 20mg  daily.  General Health Maintenance / Followup Plans -Plan to see patient in 6 months unless patient needs sooner. -Advise patient to check blood pressure at home after resting for 10-15 minutes. -Advise patient to compare home medication list with office list and to bring medication bottles to next visit.             Signed, Perlie Gold, PA-C

## 2023-06-07 NOTE — Patient Instructions (Signed)
Medication Instructions:  Your physician has recommended you make the following change in your medication:   REDUCE Losartan back to 50 mg taking 1 daily   TAKE YOUR BOTTLES OF MEDS AND MATCH THEM TO THIS MED LIST... IF YOU NOTICE THE LOSARTAN IS 100 MG CALL ME SO WE CAN KEEP IT THERE.  *If you need a refill on your cardiac medications before your next appointment, please call your pharmacy*   Lab Work: None ordered  If you have labs (blood work) drawn today and your tests are completely normal, you will receive your results only by: MyChart Message (if you have MyChart) OR A paper copy in the mail If you have any lab test that is abnormal or we need to change your treatment, we will call you to review the results.   Testing/Procedures: None ordered   Follow-Up: At [facility], you and your health needs are our priority.  As part of our continuing mission to provide you with exceptional heart care, we have created designated Provider Care Teams.  These Care Teams include your primary Cardiologist (physician) and Advanced Practice Providers (APPs -  Physician Assistants and Nurse Practitioners) who all work together to provide you with the care you need, when you need it.  We recommend signing up for the patient portal called "MyChart".  Sign up information is provided on this After Visit Summary.  MyChart is used to connect with patients for Virtual Visits (Telemedicine).  Patients are able to view lab/test results, encounter notes, upcoming appointments, etc.  Non-urgent messages can be sent to your provider as well.   To learn more about what you can do with MyChart, go to [email or web address].    Your next appointment:   6 month(s)  Provider:   Tonny Bollman, MD     Other Instructions

## 2023-06-11 ENCOUNTER — Telehealth: Payer: Self-pay | Admitting: Pulmonary Disease

## 2023-06-11 DIAGNOSIS — J849 Interstitial pulmonary disease, unspecified: Secondary | ICD-10-CM

## 2023-06-11 NOTE — Telephone Encounter (Signed)
Pt wants a new Oxygen concentrator

## 2023-06-14 NOTE — Telephone Encounter (Signed)
PT calling again for a new POC. States it gets hot and does not operate. Adv to call supplier but they will likely need a RX. Pls call PT to advise. She is now homebound because of it.   Her number is [phone]

## 2023-06-14 NOTE — Telephone Encounter (Signed)
I have sent urgent message to [employer] asking them to check on this issue

## 2023-06-14 NOTE — Telephone Encounter (Signed)
PT just called the supplier. The supplier is waiting for out RX. PT states she really needs this 02 because she is trapped at home w/o it. TY.

## 2023-06-14 NOTE — Telephone Encounter (Signed)
I received a message from [place] with [employer] This patient has reach her 5 year billing cap. We will need to get her requalified. A New RX, Sats and face to face notes. Please have nurse call with any question. [phone].  Thank you."  We will need a New continuous RX and Continuous sats. I have send our a service call to tie her over.

## 2023-06-14 NOTE — Telephone Encounter (Signed)
We already placed referral so routing to [facility] per protocol to check status thanks

## 2023-06-15 ENCOUNTER — Ambulatory Visit
Admission: RE | Admit: 2023-06-15 | Discharge: 2023-06-15 | Disposition: A | Discharge status: Home / Self Care | Payer: PPO | Source: Ambulatory Visit | Attending: Pulmonary Disease | Admitting: Pulmonary Disease

## 2023-06-15 DIAGNOSIS — J849 Interstitial pulmonary disease, unspecified: Secondary | ICD-10-CM

## 2023-06-15 DIAGNOSIS — I2510 Atherosclerotic heart disease of native coronary artery without angina pectoris: Secondary | ICD-10-CM | POA: Diagnosis not present

## 2023-06-15 DIAGNOSIS — J8410 Pulmonary fibrosis, unspecified: Secondary | ICD-10-CM | POA: Diagnosis not present

## 2023-06-15 DIAGNOSIS — J398 Other specified diseases of upper respiratory tract: Secondary | ICD-10-CM | POA: Diagnosis not present

## 2023-06-15 DIAGNOSIS — K449 Diaphragmatic hernia without obstruction or gangrene: Secondary | ICD-10-CM | POA: Diagnosis not present

## 2023-06-15 NOTE — Telephone Encounter (Signed)
Order has been placed for 3L and POC as well based off of documentation and OV note.

## 2023-06-29 ENCOUNTER — Telehealth: Payer: Self-pay | Admitting: Pulmonary Disease

## 2023-06-29 NOTE — Telephone Encounter (Signed)
Patient would like to know the results of her CT scan. She doesn't use MyChart. Her number is [phone]

## 2023-06-30 DIAGNOSIS — H35371 Puckering of macula, right eye: Secondary | ICD-10-CM | POA: Diagnosis not present

## 2023-06-30 DIAGNOSIS — H4322 Crystalline deposits in vitreous body, left eye: Secondary | ICD-10-CM | POA: Diagnosis not present

## 2023-06-30 DIAGNOSIS — H43822 Vitreomacular adhesion, left eye: Secondary | ICD-10-CM | POA: Diagnosis not present

## 2023-06-30 DIAGNOSIS — H353222 Exudative age-related macular degeneration, left eye, with inactive choroidal neovascularization: Secondary | ICD-10-CM | POA: Diagnosis not present

## 2023-06-30 DIAGNOSIS — H353211 Exudative age-related macular degeneration, right eye, with active choroidal neovascularization: Secondary | ICD-10-CM | POA: Diagnosis not present

## 2023-06-30 DIAGNOSIS — H43811 Vitreous degeneration, right eye: Secondary | ICD-10-CM | POA: Diagnosis not present

## 2023-07-05 NOTE — Telephone Encounter (Signed)
I called and spoke with pt about ct results. Pt was told her results have not been read by radiology. I had advised pt she will be called when results are in. Pt does not have mychart, please call with results

## 2023-07-06 NOTE — Telephone Encounter (Signed)
I called and reviewed CT with patient that shows stable ILD. Nothing further needed

## 2023-09-02 NOTE — Telephone Encounter (Signed)
I just spoke with Cheryl Brown and she stated [employer] finally replaced her home concentrator

## 2023-10-07 ENCOUNTER — Other Ambulatory Visit (HOSPITAL_COMMUNITY): Payer: Self-pay | Admitting: Internal Medicine

## 2023-10-07 DIAGNOSIS — Z8719 Personal history of other diseases of the digestive system: Secondary | ICD-10-CM

## 2023-10-07 DIAGNOSIS — R112 Nausea with vomiting, unspecified: Secondary | ICD-10-CM

## 2023-10-07 DIAGNOSIS — R1030 Lower abdominal pain, unspecified: Secondary | ICD-10-CM

## 2023-10-08 ENCOUNTER — Ambulatory Visit (HOSPITAL_COMMUNITY)
Admission: RE | Admit: 2023-10-08 | Discharge: 2023-10-08 | Disposition: A | Discharge status: Home / Self Care | Payer: PPO | Source: Ambulatory Visit | Attending: Internal Medicine | Admitting: Internal Medicine

## 2023-10-08 DIAGNOSIS — R1030 Lower abdominal pain, unspecified: Secondary | ICD-10-CM | POA: Diagnosis present

## 2023-10-08 DIAGNOSIS — R112 Nausea with vomiting, unspecified: Secondary | ICD-10-CM | POA: Insufficient documentation

## 2023-10-08 DIAGNOSIS — K529 Noninfective gastroenteritis and colitis, unspecified: Secondary | ICD-10-CM | POA: Insufficient documentation

## 2023-10-08 DIAGNOSIS — Z8719 Personal history of other diseases of the digestive system: Secondary | ICD-10-CM | POA: Diagnosis present

## 2023-10-08 LAB — POCT I-STAT CREATININE: Creatinine, Ser: 0.8 mg/dL (ref 0.44–1.00)

## 2023-10-08 MED ORDER — IOHEXOL 300 MG/ML  SOLN
100.0000 mL | Freq: Once | INTRAMUSCULAR | Status: AC | PRN
  Administered 2023-10-08: 100 mL via INTRAVENOUS

## 2023-10-15 DIAGNOSIS — K5901 Slow transit constipation: Secondary | ICD-10-CM | POA: Diagnosis not present

## 2023-10-27 DIAGNOSIS — H43822 Vitreomacular adhesion, left eye: Secondary | ICD-10-CM | POA: Diagnosis not present

## 2023-10-27 DIAGNOSIS — H43811 Vitreous degeneration, right eye: Secondary | ICD-10-CM | POA: Diagnosis not present

## 2023-10-27 DIAGNOSIS — H353222 Exudative age-related macular degeneration, left eye, with inactive choroidal neovascularization: Secondary | ICD-10-CM | POA: Diagnosis not present

## 2023-10-27 DIAGNOSIS — H353211 Exudative age-related macular degeneration, right eye, with active choroidal neovascularization: Secondary | ICD-10-CM | POA: Diagnosis not present

## 2023-10-27 DIAGNOSIS — H35371 Puckering of macula, right eye: Secondary | ICD-10-CM | POA: Diagnosis not present

## 2023-10-27 DIAGNOSIS — H4322 Crystalline deposits in vitreous body, left eye: Secondary | ICD-10-CM | POA: Diagnosis not present

## 2023-12-15 ENCOUNTER — Other Ambulatory Visit: Payer: Self-pay | Admitting: Cardiology

## 2023-12-21 DIAGNOSIS — H903 Sensorineural hearing loss, bilateral: Secondary | ICD-10-CM | POA: Diagnosis not present

## 2023-12-21 DIAGNOSIS — H9313 Tinnitus, bilateral: Secondary | ICD-10-CM | POA: Diagnosis not present

## 2023-12-29 DIAGNOSIS — H4322 Crystalline deposits in vitreous body, left eye: Secondary | ICD-10-CM | POA: Diagnosis not present

## 2023-12-29 DIAGNOSIS — H353211 Exudative age-related macular degeneration, right eye, with active choroidal neovascularization: Secondary | ICD-10-CM | POA: Diagnosis not present

## 2023-12-29 DIAGNOSIS — H43811 Vitreous degeneration, right eye: Secondary | ICD-10-CM | POA: Diagnosis not present

## 2023-12-29 DIAGNOSIS — H43822 Vitreomacular adhesion, left eye: Secondary | ICD-10-CM | POA: Diagnosis not present

## 2023-12-29 DIAGNOSIS — H353222 Exudative age-related macular degeneration, left eye, with inactive choroidal neovascularization: Secondary | ICD-10-CM | POA: Diagnosis not present

## 2023-12-29 DIAGNOSIS — H35371 Puckering of macula, right eye: Secondary | ICD-10-CM | POA: Diagnosis not present

## 2024-01-25 DIAGNOSIS — J449 Chronic obstructive pulmonary disease, unspecified: Secondary | ICD-10-CM | POA: Diagnosis not present

## 2024-01-25 DIAGNOSIS — J9611 Chronic respiratory failure with hypoxia: Secondary | ICD-10-CM | POA: Diagnosis not present

## 2024-01-25 DIAGNOSIS — M792 Neuralgia and neuritis, unspecified: Secondary | ICD-10-CM | POA: Diagnosis not present

## 2024-01-25 DIAGNOSIS — I5032 Chronic diastolic (congestive) heart failure: Secondary | ICD-10-CM | POA: Diagnosis not present

## 2024-01-25 DIAGNOSIS — Z1331 Encounter for screening for depression: Secondary | ICD-10-CM | POA: Diagnosis not present

## 2024-01-25 DIAGNOSIS — Z794 Long term (current) use of insulin: Secondary | ICD-10-CM | POA: Diagnosis not present

## 2024-01-25 DIAGNOSIS — Z0000 Encounter for general adult medical examination without abnormal findings: Secondary | ICD-10-CM | POA: Diagnosis not present

## 2024-01-25 DIAGNOSIS — E1140 Type 2 diabetes mellitus with diabetic neuropathy, unspecified: Secondary | ICD-10-CM | POA: Diagnosis not present

## 2024-01-25 DIAGNOSIS — G2581 Restless legs syndrome: Secondary | ICD-10-CM | POA: Diagnosis not present

## 2024-01-25 DIAGNOSIS — I10 Essential (primary) hypertension: Secondary | ICD-10-CM | POA: Diagnosis not present

## 2024-01-25 DIAGNOSIS — Z79899 Other long term (current) drug therapy: Secondary | ICD-10-CM | POA: Diagnosis not present

## 2024-01-25 DIAGNOSIS — E559 Vitamin D deficiency, unspecified: Secondary | ICD-10-CM | POA: Diagnosis not present

## 2024-01-25 DIAGNOSIS — K219 Gastro-esophageal reflux disease without esophagitis: Secondary | ICD-10-CM | POA: Diagnosis not present

## 2024-03-03 DIAGNOSIS — M7742 Metatarsalgia, left foot: Secondary | ICD-10-CM | POA: Diagnosis not present

## 2024-03-03 DIAGNOSIS — M79675 Pain in left toe(s): Secondary | ICD-10-CM | POA: Diagnosis not present

## 2024-03-08 DIAGNOSIS — H35371 Puckering of macula, right eye: Secondary | ICD-10-CM | POA: Diagnosis not present

## 2024-03-08 DIAGNOSIS — H353222 Exudative age-related macular degeneration, left eye, with inactive choroidal neovascularization: Secondary | ICD-10-CM | POA: Diagnosis not present

## 2024-03-08 DIAGNOSIS — H43811 Vitreous degeneration, right eye: Secondary | ICD-10-CM | POA: Diagnosis not present

## 2024-03-08 DIAGNOSIS — H43822 Vitreomacular adhesion, left eye: Secondary | ICD-10-CM | POA: Diagnosis not present

## 2024-03-08 DIAGNOSIS — H4322 Crystalline deposits in vitreous body, left eye: Secondary | ICD-10-CM | POA: Diagnosis not present

## 2024-03-08 DIAGNOSIS — H353211 Exudative age-related macular degeneration, right eye, with active choroidal neovascularization: Secondary | ICD-10-CM | POA: Diagnosis not present

## 2024-03-11 ENCOUNTER — Other Ambulatory Visit: Payer: Self-pay | Admitting: Cardiology

## 2024-03-29 DIAGNOSIS — M79672 Pain in left foot: Secondary | ICD-10-CM | POA: Diagnosis not present

## 2024-05-11 NOTE — Progress Notes (Unsigned)
 Cardiology Office Note   Date:  05/12/2024  ID:  Cheryl Brown, DOB [birth year], MRN [id number] PCP: [name], MD  [facility] Providers Cardiologist:  Ozell Fell, MD   History of Present Illness Cheryl Brown is a 88 y.o. female with a past medical history of hypertension, hyperlipidemia, hypersensitivity pneumonitis, COPD, OSA, and restless leg syndrome here for follow-up appointment.  She presented with her daughter at her office visit last year.  Patient was seen for follow-up back then after CT scan indicated potential heart strain earlier last year.  Patient reported worsening dyspnea shortness of breath activity around the house.  Patient has been using her oxygen  primarily at night and as needed during the day due to the weight of the condenser.  Between June and July of last year patient was using oxygen  more consistently and had improvement in symptoms of dyspnea.  With the use of a condenser O2 tank at home patient was able to manage small amounts of exertion and maintain O2 levels over 88%.  Echocardiogram in July of last year revealed a left ventricular ejection fraction of 65%, grade 1 DD, no significant valvular abnormalities were noted.  At last year's visit 05/2023 she had been checking her blood pressure at home typically in the 140s.  Stop checking in September due to personal stressors.  Patient had reported increased reliance on oxygen  and experiencing headaches and near fainting spells when off oxygen  for any significant length of time.  Patient expressed concern about the progression of her O2 requirements and said that she was set up for a CT scan by her pulmonologist.  Regarding her medications she has only been taking the losartan  50 mg despite being increased back in June.  Apparently had some confusion about the dosing.  Has been compliant with spironolactone  25 mg and furosemide  20 mg daily (does not take Lasix  on days with significant time away  from the house due to urination concerns).  Aside from chronic and progressive dyspnea along with fatigue and dizziness when off oxygen  patient denied chest pain, lower extremity edema, palpitations, melena, hematuria, hemoptysis, diaphoresis, weakness, orthopnea, and PND.  Today, she presents with a history of hypertension and hyperlipidemia for a routine cardiac follow-up visit.  She has experienced low blood pressure since her last visit a year ago. Her losartan  dosage was reduced from 100 mg to 50 mg due to hypotension, but her blood pressure remains low. She is on spironolactone  25 mg daily, with a potassium level of 5.2 mEq/L in June, and takes Lasix  20 mg daily.  Her last echocardiogram in July of the previous year showed an LVEF of 65% with mild concentric left ventricular hypertrophy and grade one diastolic dysfunction. The aortic valve was normal with no changes from prior studies.  She is on chronic oxygen  therapy, and her oxygen  saturation today is 93%. She experiences chronic and progressive shortness of breath. No fatigue, dizziness, lower extremity edema, palpitations, or chest pain.  She takes fish oil 2000 mg daily and Lipitor 20 mg daily for cholesterol management. Her LDL was 102 mg/dL, HDL 45 mg/dL, total cholesterol 817 mg/dL, and triglycerides 796 mg/dL as of June. Her creatinine was 0.8 mg/dL, and her blood glucose was elevated. She is working with her primary care provider to decrease her hemoglobin A1c. She does not have coronary artery disease.  Reports no chest pain, pressure, or tightness. No edema, orthopnea, PND. Reports no palpitations.    ROS: Pertinent ROS in HPI  Studies Reviewed EKG Interpretation Date/Time:  Friday May 12 2024 13:07:13 EDT Ventricular Rate:  79 PR Interval:  166 QRS Duration:  90 QT Interval:  388 QTC Calculation: 444 R Axis:   -40  Text Interpretation: Normal sinus rhythm Left axis deviation Pulmonary disease pattern RSR' or QR  pattern in V1 suggests right ventricular conduction delay When compared with ECG of 28-Aug-2016 18:56, PREVIOUS ECG IS PRESENT Confirmed by Lucien Blanc [phone]) on 05/12/2024 4:31:57 PM    02/17/23 TTE   IMPRESSIONS     1. Left ventricular ejection fraction, by estimation, is 60 to 65%. The  left ventricle has normal function. The left ventricle has no regional  wall motion abnormalities. There is mild concentric left ventricular  hypertrophy. Left ventricular diastolic  parameters are consistent with Grade I diastolic dysfunction (impaired  relaxation).   2. Right ventricular systolic function is normal. The right ventricular  size is normal.   3. No evidence of mitral valve regurgitation.   4. The aortic valve is tricuspid. Aortic valve regurgitation is not  visualized.   5. The inferior vena cava is normal in size with greater than 50%  respiratory variability, suggesting right atrial pressure of 3 mmHg.   Comparison(s): No significant change from prior study.   FINDINGS   Left Ventricle: Left ventricular ejection fraction, by estimation, is 60  to 65%. The left ventricle has normal function. The left ventricle has no  regional wall motion abnormalities. The left ventricular internal cavity  size was normal in size. There is   mild concentric left ventricular hypertrophy. Left ventricular diastolic  parameters are consistent with Grade I diastolic dysfunction (impaired  relaxation).   Right Ventricle: The right ventricular size is normal. Right ventricular  systolic function is normal.   Left Atrium: Left atrial size was normal in size.   Right Atrium: Right atrial size was normal in size.   Pericardium: There is no evidence of pericardial effusion.   Mitral Valve: Mild mitral annular calcification. No evidence of mitral  valve regurgitation.   Tricuspid Valve: Tricuspid valve regurgitation is not demonstrated.   Aortic Valve: The aortic valve is tricuspid. Aortic  valve regurgitation is  not visualized.   Pulmonic Valve: The pulmonic valve was normal in structure. Pulmonic valve  regurgitation is not visualized.   Aorta: The aortic root and ascending aorta are structurally normal, with  no evidence of dilitation.   Venous: The inferior vena cava is normal in size with greater than 50%  respiratory variability, suggesting right atrial pressure of 3 mmHg.   IAS/Shunts: No atrial level shunt detected by color flow Doppler.   Physical Exam VS:  BP 105/64   Pulse 79   Ht 5' 2 (1.575 m)   Wt 196 lb (88.9 kg)   SpO2 93%   BMI 35.85 kg/m        Wt Readings from Last 3 Encounters:  05/12/24 196 lb (88.9 kg)  06/07/23 201 lb (91.2 kg)  06/03/23 202 lb 3.2 oz (91.7 kg)    GEN: Well nourished, well developed in no acute distress NECK: No JVD; No carotid bruits CARDIAC: RRR, no murmurs, rubs, gallops RESPIRATORY:  Clear to auscultation without rales, wheezing or rhonchi  ABDOMEN: Soft, non-tender, non-distended EXTREMITIES:  No edema; No deformity   ASSESSMENT AND PLAN  Chronic diastolic heart failure with preserved ejection fraction Confirmed by echocardiogram showing LVEF of 65% and grade one diastolic dysfunction. No coronary artery disease or fluid overload. - Continue spironolactone  25  mg daily. - Continue Lasix  20 mg daily. - Ordered follow-up echocardiogram for continued surveillance  Chronic dyspnea Chronic and progressive shortness of breath, multifactorial. No palpitations or chest pain. - Follow up in 6 months with Dr. Wonda.  Essential hypertension with hypotension on antihypertensive therapy Hypertension with recent hypotension. Current blood pressure is 105/64 mmHg. Losartan  reduced due to hypotension. - Reduce losartan  to 25 mg daily. - Continue to monitor blood pressure closely at home  Hyperlipidemia LDL of 102 mg/dL, HDL of 45 mg/dL, total cholesterol of 817 mg/dL. Triglycerides elevated at 203 mg/dL. Goal LDL <100  mg/dL due to no coronary artery disease. - Continue Lipitor 20 mg daily. - Continue fish oil 2000 mg daily. - Recheck lipid panel with primary care provider.  Chronic respiratory failure with hypoxia Chronic respiratory failure with hypoxia, on chronic oxygen  therapy. Oxygen  saturation is 93%. - Continue chronic oxygen  therapy.      Dispo: She can follow-up in 6 months with Dr. Wonda  Signed, Orren LOISE Fabry, PA-C

## 2024-05-12 ENCOUNTER — Ambulatory Visit: Attending: Physician Assistant | Admitting: Physician Assistant

## 2024-05-12 VITALS — BP 105/64 | HR 79 | Ht 62.0 in | Wt 196.0 lb

## 2024-05-12 DIAGNOSIS — J679 Hypersensitivity pneumonitis due to unspecified organic dust: Secondary | ICD-10-CM

## 2024-05-12 DIAGNOSIS — I5032 Chronic diastolic (congestive) heart failure: Secondary | ICD-10-CM

## 2024-05-12 DIAGNOSIS — J9611 Chronic respiratory failure with hypoxia: Secondary | ICD-10-CM

## 2024-05-12 DIAGNOSIS — I10 Essential (primary) hypertension: Secondary | ICD-10-CM

## 2024-05-12 DIAGNOSIS — E785 Hyperlipidemia, unspecified: Secondary | ICD-10-CM | POA: Diagnosis not present

## 2024-05-12 DIAGNOSIS — R0609 Other forms of dyspnea: Secondary | ICD-10-CM | POA: Diagnosis not present

## 2024-05-12 MED ORDER — LOSARTAN POTASSIUM 25 MG PO TABS: 25.0000 mg | ORAL_TABLET | Freq: Every day | ORAL | 3 refills | Status: AC

## 2024-05-12 NOTE — Patient Instructions (Addendum)
 Medication Instructions:   START TAKING : LOSARTAN   25 MG ONCE A DAY   *If you need a refill on your cardiac medications before your next appointment, please call your pharmacy*   Lab Work: NONE ORDERED  TODAY     If you have labs (blood work) drawn today and your tests are completely normal, you will receive your results only by: MyChart Message (if you have MyChart) OR A paper copy in the mail If you have any lab test that is abnormal or we need to change your treatment, we will call you to review the results.    Testing/Procedures:  Your physician has requested that you have an echocardiogram. Echocardiography is a painless test that uses sound waves to create images of your heart. It provides your doctor with information about the size and shape of your heart and how well your heart's chambers and valves are working. This procedure takes approximately one hour. There are no restrictions for this procedure. Please do NOT wear cologne, perfume, aftershave, or lotions (deodorant is allowed). Please arrive 15 minutes prior to your appointment time.  Please note: We ask at that you not bring children with you during ultrasound (echo/ vascular) testing. Due to room size and safety concerns, children are not allowed in the ultrasound rooms during exams. Our front office staff cannot provide observation of children in our lobby area while testing is being conducted. An adult accompanying a patient to their appointment will only be allowed in the ultrasound room at the discretion of the ultrasound technician under special circumstances. We apologize for any inconvenience.    Follow-Up: At [facility], you and your health needs are our priority.  As part of our continuing mission to provide you with exceptional heart care, our providers are all part of one team.  This team includes your primary Cardiologist (physician) and Advanced Practice Providers or APPs (Physician Assistants and  Nurse Practitioners) who all work together to provide you with the care you need, when you need it.  Your next appointment:    6 month(s)   Provider:    Ozell Fell, MD    We recommend signing up for the patient portal called MyChart.  Sign up information is provided on this After Visit Summary.  MyChart is used to connect with patients for Virtual Visits (Telemedicine).  Patients are able to view lab/test results, encounter notes, upcoming appointments, etc.  Non-urgent messages can be sent to your provider as well.   To learn more about what you can do with MyChart, go to [email or web address].      Low-Sodium Eating Plan Salt (sodium) helps you keep a healthy balance of fluids in your body. Too much sodium can raise your blood pressure. It can also cause fluid and waste to be held in your body. Your health care provider or dietitian may recommend a low-sodium eating plan if you have high blood pressure (hypertension), kidney disease, liver disease, or heart failure. Eating less sodium can help lower your blood pressure and reduce swelling. It can also protect your heart, liver, and kidneys. What are tips for following this plan? Reading food labels  Check food labels for the amount of sodium per serving. If you eat more than one serving, you must multiply the listed amount by the number of servings. Choose foods with less than 140 milligrams (mg) of sodium per serving. Avoid foods with 300 mg of sodium or more per serving. Always check how much sodium is in a  product, even if the label says unsalted or no salt added. Shopping  Buy products labeled as low-sodium or no salt added. Buy fresh foods. Avoid canned foods and pre-made or frozen meals. Avoid canned, cured, or processed meats. Buy breads that have less than 80 mg of sodium per slice. Cooking  Eat more home-cooked food. Try to eat less restaurant, buffet, and fast food. Try not to add salt when you cook. Use  salt-free seasonings or herbs instead of table salt or sea salt. Check with your provider or pharmacist before using salt substitutes. Cook with plant-based oils, such as canola, sunflower, or olive oil. Meal planning When eating at a restaurant, ask if your food can be made with less salt or no salt. Avoid dishes labeled as brined, pickled, cured, or smoked. Avoid dishes made with soy sauce, miso, or teriyaki sauce. Avoid foods that have monosodium glutamate (MSG) in them. MSG may be added to some restaurant food, sauces, soups, bouillon, and canned foods. Make meals that can be grilled, baked, poached, roasted, or steamed. These are often made with less sodium. General information Try to limit your sodium intake to 1,500-2,300 mg each day, or the amount told by your provider. What foods should I eat? Fruits Fresh, frozen, or canned fruit. Fruit juice. Vegetables Fresh or frozen vegetables. No salt added canned vegetables. No salt added tomato sauce and paste. Low-sodium or reduced-sodium tomato and vegetable juice. Grains Low-sodium cereals, such as oats, puffed wheat and rice, and shredded wheat. Low-sodium crackers. Unsalted rice. Unsalted pasta. Low-sodium bread. Whole grain breads and whole grain pasta. Meats and other proteins Fresh or frozen meat, poultry, seafood, and fish. These should have no added salt. Low-sodium canned tuna and salmon. Unsalted nuts. Dried peas, beans, and lentils without added salt. Unsalted canned beans. Eggs. Unsalted nut butters. Dairy Milk. Soy milk. Cheese that is naturally low in sodium, such as ricotta cheese, fresh mozzarella, or Swiss cheese. Low-sodium or reduced-sodium cheese. Cream cheese. Yogurt. Seasonings and condiments Fresh and dried herbs and spices. Salt-free seasonings. Low-sodium mustard and ketchup. Sodium-free salad dressing. Sodium-free light mayonnaise. Fresh or refrigerated horseradish. Lemon juice. Vinegar. Other foods Homemade,  reduced-sodium, or low-sodium soups. Unsalted popcorn and pretzels. Low-salt or salt-free chips. The items listed above may not be all the foods and drinks you can have. Talk to a dietitian to learn more. What foods should I avoid? Vegetables Sauerkraut, pickled vegetables, and relishes. Olives. [place] fries. Onion rings. Regular canned vegetables, except low-sodium or reduced-sodium items. Regular canned tomato sauce and paste. Regular tomato and vegetable juice. Frozen vegetables in sauces. Grains Instant hot cereals. Bread stuffing, pancake, and biscuit mixes. Croutons. Seasoned rice or pasta mixes. Noodle soup cups. Boxed or frozen macaroni and cheese. Regular salted crackers. Self-rising flour. Meats and other proteins Meat or fish that is salted, canned, smoked, spiced, or pickled. Precooked or cured meat, such as sausages or meat loaves. [place]. Ham. Pepperoni. Hot dogs. Corned beef. Chipped beef. Salt pork. Jerky. Pickled herring, anchovies, and sardines. Regular canned tuna. Salted nuts. Dairy Processed cheese and cheese spreads. Hard cheeses. Cheese curds. Blue cheese. Feta cheese. String cheese. Regular cottage cheese. Buttermilk. Canned milk. Fats and oils Salted butter. Regular margarine. Ghee. Bacon fat. Seasonings and condiments Onion salt, garlic salt, seasoned salt, table salt, and sea salt. Canned and packaged gravies. Worcestershire sauce. Tartar sauce. Barbecue sauce. Teriyaki sauce. Soy sauce, including reduced-sodium soy sauce. Steak sauce. Fish sauce. Oyster sauce. Cocktail sauce. Horseradish that you find on the  shelf. Regular ketchup and mustard. Meat flavorings and tenderizers. Bouillon cubes. Hot sauce. Pre-made or packaged marinades. Pre-made or packaged taco seasonings. Relishes. Regular salad dressings. Salsa. Other foods Salted popcorn and pretzels. Corn chips and puffs. Potato and tortilla chips. Canned or dried soups. Pizza. Frozen entrees and pot pies. The items  listed above may not be all the foods and drinks you should avoid. Talk to a dietitian to learn more. This information is not intended to replace advice given to you by your health care provider. Make sure you discuss any questions you have with your health care provider. Document Revised: 08/13/2022 Document Reviewed: 08/13/2022 Elsevier Patient Education  2024 [employer].

## 2024-05-23 DIAGNOSIS — Z23 Encounter for immunization: Secondary | ICD-10-CM | POA: Diagnosis not present

## 2024-05-23 DIAGNOSIS — E1140 Type 2 diabetes mellitus with diabetic neuropathy, unspecified: Secondary | ICD-10-CM | POA: Diagnosis not present

## 2024-05-23 DIAGNOSIS — M792 Neuralgia and neuritis, unspecified: Secondary | ICD-10-CM | POA: Diagnosis not present

## 2024-05-23 DIAGNOSIS — Z794 Long term (current) use of insulin: Secondary | ICD-10-CM | POA: Diagnosis not present

## 2024-05-23 DIAGNOSIS — I10 Essential (primary) hypertension: Secondary | ICD-10-CM | POA: Diagnosis not present

## 2024-05-24 DIAGNOSIS — H4322 Crystalline deposits in vitreous body, left eye: Secondary | ICD-10-CM | POA: Diagnosis not present

## 2024-05-24 DIAGNOSIS — H353222 Exudative age-related macular degeneration, left eye, with inactive choroidal neovascularization: Secondary | ICD-10-CM | POA: Diagnosis not present

## 2024-05-24 DIAGNOSIS — H35371 Puckering of macula, right eye: Secondary | ICD-10-CM | POA: Diagnosis not present

## 2024-05-24 DIAGNOSIS — H43811 Vitreous degeneration, right eye: Secondary | ICD-10-CM | POA: Diagnosis not present

## 2024-05-24 DIAGNOSIS — H353211 Exudative age-related macular degeneration, right eye, with active choroidal neovascularization: Secondary | ICD-10-CM | POA: Diagnosis not present

## 2024-05-24 DIAGNOSIS — H43822 Vitreomacular adhesion, left eye: Secondary | ICD-10-CM | POA: Diagnosis not present

## 2024-06-01 DIAGNOSIS — M3500 Sicca syndrome, unspecified: Secondary | ICD-10-CM | POA: Insufficient documentation

## 2024-06-05 ENCOUNTER — Ambulatory Visit: Admitting: Neurology

## 2024-06-05 ENCOUNTER — Encounter: Payer: Self-pay | Admitting: Neurology

## 2024-06-05 VITALS — BP 138/78 | HR 64 | Ht 62.0 in | Wt 196.5 lb

## 2024-06-05 DIAGNOSIS — R202 Paresthesia of skin: Secondary | ICD-10-CM | POA: Diagnosis not present

## 2024-06-05 DIAGNOSIS — M542 Cervicalgia: Secondary | ICD-10-CM | POA: Diagnosis not present

## 2024-06-05 DIAGNOSIS — M5440 Lumbago with sciatica, unspecified side: Secondary | ICD-10-CM | POA: Diagnosis not present

## 2024-06-05 DIAGNOSIS — G8929 Other chronic pain: Secondary | ICD-10-CM | POA: Diagnosis not present

## 2024-06-05 MED ORDER — GABAPENTIN 100 MG PO CAPS: 300.0000 mg | ORAL_CAPSULE | Freq: Three times a day (TID) | ORAL | 11 refills | Status: DC | PRN

## 2024-06-05 NOTE — Progress Notes (Unsigned)
 Chief Complaint  Patient presents with   New Patient (Initial Visit)    Rm14, daughter present, Referral from [facility] @ [place] Brim MD [phone] for /RLS, neuropathy: pt stated that she has parasthesia in bilateral upper and lower extremities       ASSESSMENT AND PLAN  Cheryl Brown is a 88 y.o. female   Restless leg syndrome Worsening low back pain, neck pain, upper and lower extremity paresthesia  MRI of the cervical and lumbar spine for possible cervical or lumbar radiculopathy  Gabapentin  was helpful, she is on very low-dose, will try higher dose 100 mg up to 60 tablets in 1 day  Return in 6 months  DIAGNOSTIC DATA (LABS, IMAGING, TESTING) - I reviewed patient records, labs, notes, testing and imaging myself where available.   MEDICAL HISTORY:  Cheryl Brown, is a 88 year old female, accompanied by her daughter seen in request by her primary care from [facility]   Dr. Brim, Sneha P, for evaluation of intermittent upper lower extremity paresthesia, restless leg syndrome   History is obtained from the patient and review of electronic medical records. I personally reviewed pertinent available imaging films in PACS.   PMHx of  DM HLD HTN COPD-O2 dependent since 2024, Used to Smoke. Pituitary Adenoma Hx of [place] at right V1.  Chronic low back pain. Hx of knee replacement  Patient had a history of chronic low back pain for more than a decades, radiating pain to bilateral lower extremity, under a lot of stress since her husband passed away in 06-30-24not sleeping well, had long history of sleep apnea but could not tolerate CPAP, sleep with oxygen  on, get up frequently,  She has long history of bilateral lower extremity paresthesia, especially when she sits still trying to go to sleep, leg hurts, have to get up pacing around, also complains of ants crawling sensation at her upper extremity, chronic neck pain, denies bowel and bladder incontinence  Over the  years tried different medications, now on Mirapex  0.5 mg, gabapentin  200 mg, Elavil 50 mg at bedtime  Laboratory evaluation in 30-Jun-2025triglycerides 203, LDL 102, A1c 12.8, vitamin D22.6, CMP, creatinine 0.82, glucose 297, sodium 132, normal hemoglobin of 13.5,  PHYSICAL EXAM:   Vitals:   06/05/24 1047  BP: 138/78  Pulse: 64  SpO2: 90%  Weight: 196 lb 8 oz (89.1 kg)  Height: 5' 2 (1.575 m)   Body mass index is 35.94 kg/m.  PHYSICAL EXAMNIATION:  Gen: NAD, conversant, well nourised, well groomed                     Cardiovascular: Regular rate rhythm, no peripheral edema, warm, nontender. Eyes: Conjunctivae clear without exudates or hemorrhage Neck: Supple, no carotid bruits. Pulmonary: Clear to auscultation bilaterally   NEUROLOGICAL EXAM:  MENTAL STATUS: Speech/cognition: Awake, alert, oriented to history taking and casual conversation CRANIAL NERVES: CN II: Visual fields are full to confrontation. Pupils are round equal and  reactive to light. OD 20/400, OS could not finger counting, CN III, IV, VI: extraocular movement are normal. No ptosis. CN V: Facial sensation is intact to light touch CN VII: Face is symmetric with normal eye closure  CN VIII: Hearing is decreased to conversation. CN IX, X: Phonation is normal. CN XI: Head turning and shoulder shrug are intact  MOTOR: s/p right hand 1-4th amputation due to previous injury, otherwise, normal strenght  REFLEXES: Reflexes are 2+ and symmetric at the biceps, triceps, knees, and absent  at ankles. Plantar responses are flexor.  SENSORY: Length dependent decreased to  light touch, pinprick and vibratory sensation to distal shin level  COORDINATION: There is no trunk or limb dysmetria noted.  GAIT/STANCE: able to get up, arm acrossed, cautious  REVIEW OF SYSTEMS:  Full 14 system review of systems performed and notable only for as above All other review of systems were negative.   ALLERGIES: Allergies   Allergen Reactions   Morphine And Codeine Shortness Of Breath   Bacitracin-Polymyxin B     Other reaction(s): inflammation  Other Reaction(s): Unknown   Codeine Nausea Only    Other reaction(s): nausea/rash/itching  Other Reaction(s): Unknown   Empagliflozin Other (See Comments)    Other reaction(s): rash   Ipratropium-Albuterol Other (See Comments)    VERY NERVOUS   Ipratropium-Albuterol Other (See Comments)    VERY NERVOUS   Metformin  Hcl Other (See Comments)    Other reaction(s): severe abdominal pain and diarrhea   Morphine     Other Reaction(s): Not available  morphine   Morphine Sulfate     Other reaction(s): shortness of breath  Other Reaction(s): Unknown   Neosporin [Neomycin-Bacitracin Zn-Polymyx] Other (See Comments)    Had knee surgery and it did not help me heal   Niaspan  [Niacin ] Other (See Comments)    Abdominal pain   Nitrofurantoin Macrocrystal Nausea Only   Norvasc  [Amlodipine  Besylate] Other (See Comments)    10/15/14 headache 08/27/15 abdominal pain, indigestion   Novolin N [Insulin  Isophane]     Other reaction(s): itching / rash   Prednisone     Pregabalin Swelling   Propoxyphene N-Acetaminophen  Other (See Comments)    UPSET STOMACH   Simvastatin Other (See Comments)    Leg cramps   Sulfa  Antibiotics     Other reaction(s): Rash/itching   Sulfacetamide Sodium-Sulfur     Other Reaction(s): Unknown   Sulfonamide Derivatives Itching   Pramipexole  Itching and Rash    Patient is actively taking    Pramipexole  Dihydrochloride Itching and Rash    Patient is actively taking     HOME MEDICATIONS: Current Outpatient Medications  Medication Sig Dispense Refill   albuterol (VENTOLIN HFA) 108 (90 Base) MCG/ACT inhaler Inhale 2 puffs into the lungs every 6 (six) hours as needed.     amitriptyline (ELAVIL) 50 MG tablet Take 50 mg by mouth at bedtime.     atorvastatin (LIPITOR) 20 MG tablet Take 20 mg by mouth daily.     BD PEN NEEDLE NANO U/F 32G X 4 MM  MISC use as directed once daily 100 each 10   betamethasone valerate (VALISONE) 0.1 % cream APPLY A THIN LAYER TO AFFECTED AREA(S) TWICE A DAY FOR 2 WEEKS THEN USE 2 TO 3 TIMES A WEEK     estradiol (ESTRACE) 0.1 MG/GM vaginal cream Place 1 g vaginally 3 (three) times a week.     furosemide  (LASIX ) 20 MG tablet Take 1 tablet (20 mg total) by mouth daily. (Patient taking differently: Take 20 mg by mouth as needed for fluid or edema.) 30 tablet 6   gabapentin  (NEURONTIN ) 100 MG capsule Take 200 mg by mouth at bedtime.     glimepiride (AMARYL) 2 MG tablet Take 4 mg by mouth daily.     glucose blood (ONE TOUCH ULTRA TEST) test strip 1 each by Other route daily. Use to check blood sugar daily Dx: E11.41 90 each 3   losartan  (COZAAR ) 25 MG tablet Take 1 tablet (25 mg total) by mouth daily. 90 tablet  3   magnesium hydroxide (MILK OF MAGNESIA) 400 MG/5ML suspension Take by mouth daily as needed for mild constipation.     omeprazole (PRILOSEC OTC) 20 MG tablet daily.     ONETOUCH DELICA LANCETS MISC Check blood sugar as directed 100 each 3   OXYGEN  See admin instructions. Patient take 3 liters at bedtime and at daytime if needed     OZEMPIC, 0.25 OR 0.5 MG/DOSE, 2 MG/3ML SOPN once a week.     pramipexole  (MIRAPEX ) 0.5 MG tablet Take 0.5 mg by mouth at bedtime.     spironolactone  (ALDACTONE ) 25 MG tablet Take 1 tablet (25 mg total) by mouth daily. 90 tablet 1   TOUJEO  SOLOSTAR 300 UNIT/ML SOPN Inject 42 Units into the skin every evening.      No current facility-administered medications for this visit.    PAST MEDICAL HISTORY: Past Medical History:  Diagnosis Date   Blood transfusion    COPD (chronic obstructive pulmonary disease) (HCC)    Diabetes mellitus    Dysmetabolic syndrome    Hyperlipemia    Hypertension    IBS (irritable bowel syndrome)    Neoplasm of pituitary gland    Dr.Nudelman    Palpitations    Pituitary adenoma (HCC) 1991   Dr.Love and Dr.Nudelman    Recurrent UTI 2013-14    Dr. Terence and Dr. Estelle   Reflux esophagitis    Restless leg syndrome    Scoliosis    Spinal Stenosis   Shingles outbreak     PAST SURGICAL HISTORY: Past Surgical History:  Procedure Laterality Date   ANKLE FRACTURE SURGERY     left ankle   BREAST EXCISIONAL BIOPSY Bilateral    CHOLECYSTECTOMY     COLONOSCOPY  2014   [facility], [facility]   CYSTOSCOPY  2014    Dr Terence   ESOPHAGEAL DILATION  01/2003   FINGER AMPUTATION     related to work injury    g2 p2     KNEE SURGERY  08/2007    complicated by difficult resuscitation post anesthesia    LAPAROSCOPIC CHOLECYSTECTOMY  03/2011   [facility] [place]   PARTIAL HYSTERECTOMY     for dysfunctional bleeding    pituitary adenoma     Dr. Maurice   UPPER GI ENDOSCOPY  2014   gastric polyp   URETHRAL DILATION  2014   VIDEO BRONCHOSCOPY Bilateral 06/13/2019   Procedure: VIDEO BRONCHOSCOPY WITH FLUORO;  Surgeon: Mannam, Praveen, MD;  Location: [facility] ENDOSCOPY;  Service: Cardiopulmonary;  Laterality: Bilateral;    FAMILY HISTORY: Family History  Problem Relation Age of Onset   Alzheimer's disease Mother    Arthritis Mother    Mental illness Mother        alzheimers   Bone cancer Father    Prostate cancer Father    Diabetes Father    Coronary artery disease Father    Cancer Father        bone, prostate   Alzheimer's disease Maternal Aunt    Mental illness Maternal Aunt        alzheimers   Mitral valve prolapse Sister    Alzheimer's disease Sister    Hypertension Sister    Diabetes Sister    Stroke Paternal Grandmother    Osteoporosis Sister    Breast cancer Sister 58   Mental illness Maternal Grandmother        alzheimers    SOCIAL HISTORY: Social History   Socioeconomic History   Marital status: Married  Spouse name: Not on file   Number of children: 2   Years of education: 9th   Highest education level: Not on file  Occupational History   Occupation: Retired  Tobacco Use   Smoking status: Former     Current packs/day: 0.00    Average packs/day: 0.5 packs/day for 25.0 years (12.5 ttl pk-yrs)    Types: Cigarettes    Start date: 08/10/1953    Quit date: 08/10/1978    Years since quitting: 45.8   Smokeless tobacco: Never  Vaping Use   Vaping status: Never Used  Substance and Sexual Activity   Alcohol use: No   Drug use: No   Sexual activity: Not on file  Other Topics Concern   Not on file  Social History Narrative   Lives at home with her husband.   Right-handed.   No caffeine use.   Social Drivers of Corporate Investment Banker Strain: Not on file  Food Insecurity: Not on file  Transportation Needs: Not on file  Physical Activity: Not on file  Stress: Not on file  Social Connections: Not on file  Intimate Partner Violence: Not on file      Modena Callander, M.D. Ph.D.  [facility] [address] [place], [place] [address] Ph: [phone] Fax: [phone]  CC:  [name], MD [address] [place],  [place] [address]  [name], MD

## 2024-06-09 ENCOUNTER — Other Ambulatory Visit: Payer: Self-pay | Admitting: Cardiovascular Disease

## 2024-06-22 DIAGNOSIS — H903 Sensorineural hearing loss, bilateral: Secondary | ICD-10-CM | POA: Diagnosis not present

## 2024-06-26 ENCOUNTER — Encounter (HOSPITAL_COMMUNITY): Payer: Self-pay

## 2024-06-26 ENCOUNTER — Other Ambulatory Visit: Payer: Self-pay

## 2024-06-26 ENCOUNTER — Emergency Department (HOSPITAL_COMMUNITY)

## 2024-06-26 ENCOUNTER — Inpatient Hospital Stay (HOSPITAL_COMMUNITY)
Admission: EM | Admit: 2024-06-26 | Discharge: 2024-06-30 | DRG: 378 | Disposition: A | Discharge status: Home / Self Care | Attending: Family Medicine | Admitting: Family Medicine

## 2024-06-26 DIAGNOSIS — D72820 Lymphocytosis (symptomatic): Secondary | ICD-10-CM | POA: Diagnosis present

## 2024-06-26 DIAGNOSIS — K589 Irritable bowel syndrome without diarrhea: Secondary | ICD-10-CM | POA: Diagnosis present

## 2024-06-26 DIAGNOSIS — Z883 Allergy status to other anti-infective agents status: Secondary | ICD-10-CM

## 2024-06-26 DIAGNOSIS — Z882 Allergy status to sulfonamides status: Secondary | ICD-10-CM

## 2024-06-26 DIAGNOSIS — M545 Low back pain, unspecified: Secondary | ICD-10-CM | POA: Diagnosis present

## 2024-06-26 DIAGNOSIS — J679 Hypersensitivity pneumonitis due to unspecified organic dust: Secondary | ICD-10-CM | POA: Diagnosis present

## 2024-06-26 DIAGNOSIS — I5032 Chronic diastolic (congestive) heart failure: Secondary | ICD-10-CM | POA: Diagnosis present

## 2024-06-26 DIAGNOSIS — R932 Abnormal findings on diagnostic imaging of liver and biliary tract: Secondary | ICD-10-CM | POA: Diagnosis not present

## 2024-06-26 DIAGNOSIS — E66811 Obesity, class 1: Secondary | ICD-10-CM | POA: Diagnosis present

## 2024-06-26 DIAGNOSIS — G2581 Restless legs syndrome: Secondary | ICD-10-CM | POA: Diagnosis present

## 2024-06-26 DIAGNOSIS — K5730 Diverticulosis of large intestine without perforation or abscess without bleeding: Secondary | ICD-10-CM | POA: Diagnosis not present

## 2024-06-26 DIAGNOSIS — Z87891 Personal history of nicotine dependence: Secondary | ICD-10-CM

## 2024-06-26 DIAGNOSIS — K5733 Diverticulitis of large intestine without perforation or abscess with bleeding: Principal | ICD-10-CM | POA: Diagnosis present

## 2024-06-26 DIAGNOSIS — Z79890 Hormone replacement therapy (postmenopausal): Secondary | ICD-10-CM

## 2024-06-26 DIAGNOSIS — I110 Hypertensive heart disease with heart failure: Secondary | ICD-10-CM | POA: Diagnosis present

## 2024-06-26 DIAGNOSIS — Z79899 Other long term (current) drug therapy: Secondary | ICD-10-CM

## 2024-06-26 DIAGNOSIS — K449 Diaphragmatic hernia without obstruction or gangrene: Secondary | ICD-10-CM | POA: Diagnosis not present

## 2024-06-26 DIAGNOSIS — Z885 Allergy status to narcotic agent status: Secondary | ICD-10-CM

## 2024-06-26 DIAGNOSIS — K5792 Diverticulitis of intestine, part unspecified, without perforation or abscess without bleeding: Secondary | ICD-10-CM | POA: Diagnosis not present

## 2024-06-26 DIAGNOSIS — J9610 Chronic respiratory failure, unspecified whether with hypoxia or hypercapnia: Secondary | ICD-10-CM | POA: Diagnosis present

## 2024-06-26 DIAGNOSIS — Z6835 Body mass index (BMI) 35.0-35.9, adult: Secondary | ICD-10-CM

## 2024-06-26 DIAGNOSIS — R935 Abnormal findings on diagnostic imaging of other abdominal regions, including retroperitoneum: Secondary | ICD-10-CM | POA: Diagnosis not present

## 2024-06-26 DIAGNOSIS — K649 Unspecified hemorrhoids: Secondary | ICD-10-CM | POA: Diagnosis present

## 2024-06-26 DIAGNOSIS — Z90710 Acquired absence of both cervix and uterus: Secondary | ICD-10-CM

## 2024-06-26 DIAGNOSIS — Z833 Family history of diabetes mellitus: Secondary | ICD-10-CM

## 2024-06-26 DIAGNOSIS — Z8249 Family history of ischemic heart disease and other diseases of the circulatory system: Secondary | ICD-10-CM

## 2024-06-26 DIAGNOSIS — E785 Hyperlipidemia, unspecified: Secondary | ICD-10-CM | POA: Diagnosis present

## 2024-06-26 DIAGNOSIS — K2100 Gastro-esophageal reflux disease with esophagitis, without bleeding: Secondary | ICD-10-CM | POA: Diagnosis present

## 2024-06-26 DIAGNOSIS — Z7984 Long term (current) use of oral hypoglycemic drugs: Secondary | ICD-10-CM

## 2024-06-26 DIAGNOSIS — Z713 Dietary counseling and surveillance: Secondary | ICD-10-CM

## 2024-06-26 DIAGNOSIS — G4733 Obstructive sleep apnea (adult) (pediatric): Secondary | ICD-10-CM | POA: Diagnosis present

## 2024-06-26 DIAGNOSIS — K922 Gastrointestinal hemorrhage, unspecified: Principal | ICD-10-CM | POA: Diagnosis present

## 2024-06-26 DIAGNOSIS — Z888 Allergy status to other drugs, medicaments and biological substances status: Secondary | ICD-10-CM

## 2024-06-26 DIAGNOSIS — Z9049 Acquired absence of other specified parts of digestive tract: Secondary | ICD-10-CM

## 2024-06-26 DIAGNOSIS — E11649 Type 2 diabetes mellitus with hypoglycemia without coma: Secondary | ICD-10-CM | POA: Diagnosis not present

## 2024-06-26 DIAGNOSIS — Z7985 Long-term (current) use of injectable non-insulin antidiabetic drugs: Secondary | ICD-10-CM

## 2024-06-26 DIAGNOSIS — E1140 Type 2 diabetes mellitus with diabetic neuropathy, unspecified: Secondary | ICD-10-CM | POA: Diagnosis present

## 2024-06-26 DIAGNOSIS — H905 Unspecified sensorineural hearing loss: Secondary | ICD-10-CM | POA: Diagnosis present

## 2024-06-26 DIAGNOSIS — Z9981 Dependence on supplemental oxygen: Secondary | ICD-10-CM

## 2024-06-26 DIAGNOSIS — Z791 Long term (current) use of non-steroidal anti-inflammatories (NSAID): Secondary | ICD-10-CM

## 2024-06-26 DIAGNOSIS — K921 Melena: Secondary | ICD-10-CM | POA: Diagnosis not present

## 2024-06-26 DIAGNOSIS — J439 Emphysema, unspecified: Secondary | ICD-10-CM | POA: Diagnosis present

## 2024-06-26 LAB — COMPREHENSIVE METABOLIC PANEL WITH GFR
ALT: 7 U/L (ref 0–44)
AST: 16 U/L (ref 15–41)
Albumin: 3.7 g/dL (ref 3.5–5.0)
Alkaline Phosphatase: 61 U/L (ref 38–126)
Anion gap: 10 (ref 5–15)
BUN: 20 mg/dL (ref 8–23)
CO2: 26 mmol/L (ref 22–32)
Calcium: 9 mg/dL (ref 8.9–10.3)
Chloride: 103 mmol/L (ref 98–111)
Creatinine, Ser: 0.89 mg/dL (ref 0.44–1.00)
GFR, Estimated: >60 mL/min (ref >60)
Glucose, Bld: 82 mg/dL (ref 70–99)
Potassium: 4.5 mmol/L (ref 3.5–5.1)
Sodium: 139 mmol/L (ref 135–145)
Total Bilirubin: 0.6 mg/dL (ref 0.0–1.2)
Total Protein: 6.8 g/dL (ref 6.5–8.1)

## 2024-06-26 LAB — CBC WITH DIFFERENTIAL/PLATELET
Abs Immature Granulocytes: 0.04 K/uL (ref 0.00–0.07)
Basophils Absolute: 0.1 K/uL (ref 0.0–0.1)
Basophils Relative: 1 %
Eosinophils Absolute: 0.3 K/uL (ref 0.0–0.5)
Eosinophils Relative: 4 %
HCT: 34.3 % — ABNORMAL LOW (ref 36.0–46.0)
Hemoglobin: 11.2 g/dL — ABNORMAL LOW (ref 12.0–15.0)
Immature Granulocytes: 1 %
Lymphocytes Relative: 18 %
Lymphs Abs: 1.6 K/uL (ref 0.7–4.0)
MCH: 30.6 pg (ref 26.0–34.0)
MCHC: 32.7 g/dL (ref 30.0–36.0)
MCV: 93.7 fL (ref 80.0–100.0)
Monocytes Absolute: 0.5 K/uL (ref 0.1–1.0)
Monocytes Relative: 5 %
Neutro Abs: 6.2 K/uL (ref 1.7–7.7)
Neutrophils Relative %: 71 %
Platelets: 100 K/uL — ABNORMAL LOW (ref 150–400)
RBC: 3.66 MIL/uL — ABNORMAL LOW (ref 3.87–5.11)
RDW: 12.3 % (ref 11.5–15.5)
WBC: 8.7 K/uL (ref 4.0–10.5)
nRBC: 0 % (ref 0.0–0.2)

## 2024-06-26 MED ORDER — IOHEXOL 350 MG/ML SOLN
75.0000 mL | Freq: Once | INTRAVENOUS | Status: AC | PRN
  Administered 2024-06-26: 75 mL via INTRAVENOUS

## 2024-06-26 NOTE — ED Provider Triage Note (Signed)
 Emergency Medicine Provider Triage Evaluation Note  VERENA SHAWGO , a 88 y.o. female  was evaluated in triage.  Pt complains of bright red blood per rectum, referred by primary care for same.  Does endorse increased headaches, dizziness, fatigue.  Intermittent constipation, episode of large volume stool with bright red blood this weekend..  Review of Systems  Positive: As above Negative:   Physical Exam  BP (!) 138/57 (BP Location: Right Arm)   Pulse 72   Temp 98.3 F (36.8 C)   Resp 18   Ht 5' 2 (1.575 m)   Wt 88.5 kg   SpO2 97%   BMI 35.67 kg/m  Gen:   Awake, no distress   Resp:  Normal effort MSK:   Moves extremities without difficulty  Other:    Medical Decision Making  Medically screening exam initiated at 4:58 PM.  Appropriate orders placed.  Donia DELENA Lamer was informed that the remainder of the evaluation will be completed by another provider, this initial triage assessment does not replace that evaluation, and the importance of remaining in the ED until their evaluation is complete.  Initial orders placed.   Myriam Dorn BROCKS, [place] 06/26/24 [phone]

## 2024-06-26 NOTE — ED Triage Notes (Addendum)
 Patient sent from PCP to be evaluated for rectal bleeding. She reports dark tarry stool for the past 3-4 weeks. Patient reports shob, fatigue, dizziness. She also reports she is on oxygen  24/7. She has COPD. She reports abdominal pain.  She reports she is nauseous and had diarrhea Saturday with red blood.

## 2024-06-27 ENCOUNTER — Ambulatory Visit (HOSPITAL_COMMUNITY)

## 2024-06-27 DIAGNOSIS — K625 Hemorrhage of anus and rectum: Secondary | ICD-10-CM | POA: Diagnosis not present

## 2024-06-27 DIAGNOSIS — E66811 Obesity, class 1: Secondary | ICD-10-CM | POA: Diagnosis not present

## 2024-06-27 DIAGNOSIS — Z8249 Family history of ischemic heart disease and other diseases of the circulatory system: Secondary | ICD-10-CM | POA: Diagnosis not present

## 2024-06-27 DIAGNOSIS — E11649 Type 2 diabetes mellitus with hypoglycemia without coma: Secondary | ICD-10-CM | POA: Diagnosis not present

## 2024-06-27 DIAGNOSIS — K449 Diaphragmatic hernia without obstruction or gangrene: Secondary | ICD-10-CM | POA: Diagnosis not present

## 2024-06-27 DIAGNOSIS — Z7985 Long-term (current) use of injectable non-insulin antidiabetic drugs: Secondary | ICD-10-CM | POA: Diagnosis not present

## 2024-06-27 DIAGNOSIS — D72820 Lymphocytosis (symptomatic): Secondary | ICD-10-CM | POA: Diagnosis not present

## 2024-06-27 DIAGNOSIS — K589 Irritable bowel syndrome without diarrhea: Secondary | ICD-10-CM | POA: Diagnosis not present

## 2024-06-27 DIAGNOSIS — Z9981 Dependence on supplemental oxygen: Secondary | ICD-10-CM | POA: Diagnosis not present

## 2024-06-27 DIAGNOSIS — J439 Emphysema, unspecified: Secondary | ICD-10-CM | POA: Diagnosis not present

## 2024-06-27 DIAGNOSIS — K649 Unspecified hemorrhoids: Secondary | ICD-10-CM | POA: Diagnosis not present

## 2024-06-27 DIAGNOSIS — Z7984 Long term (current) use of oral hypoglycemic drugs: Secondary | ICD-10-CM | POA: Diagnosis not present

## 2024-06-27 DIAGNOSIS — G4733 Obstructive sleep apnea (adult) (pediatric): Secondary | ICD-10-CM | POA: Diagnosis not present

## 2024-06-27 DIAGNOSIS — R932 Abnormal findings on diagnostic imaging of liver and biliary tract: Secondary | ICD-10-CM | POA: Diagnosis not present

## 2024-06-27 DIAGNOSIS — K5792 Diverticulitis of intestine, part unspecified, without perforation or abscess without bleeding: Secondary | ICD-10-CM | POA: Diagnosis not present

## 2024-06-27 DIAGNOSIS — K5730 Diverticulosis of large intestine without perforation or abscess without bleeding: Secondary | ICD-10-CM | POA: Diagnosis not present

## 2024-06-27 DIAGNOSIS — J9610 Chronic respiratory failure, unspecified whether with hypoxia or hypercapnia: Secondary | ICD-10-CM | POA: Diagnosis not present

## 2024-06-27 DIAGNOSIS — E785 Hyperlipidemia, unspecified: Secondary | ICD-10-CM | POA: Diagnosis not present

## 2024-06-27 DIAGNOSIS — R935 Abnormal findings on diagnostic imaging of other abdominal regions, including retroperitoneum: Secondary | ICD-10-CM | POA: Diagnosis not present

## 2024-06-27 DIAGNOSIS — K922 Gastrointestinal hemorrhage, unspecified: Secondary | ICD-10-CM | POA: Diagnosis present

## 2024-06-27 DIAGNOSIS — I110 Hypertensive heart disease with heart failure: Secondary | ICD-10-CM | POA: Diagnosis not present

## 2024-06-27 DIAGNOSIS — H905 Unspecified sensorineural hearing loss: Secondary | ICD-10-CM | POA: Diagnosis not present

## 2024-06-27 DIAGNOSIS — Z833 Family history of diabetes mellitus: Secondary | ICD-10-CM | POA: Diagnosis not present

## 2024-06-27 DIAGNOSIS — R109 Unspecified abdominal pain: Secondary | ICD-10-CM | POA: Diagnosis not present

## 2024-06-27 DIAGNOSIS — G2581 Restless legs syndrome: Secondary | ICD-10-CM | POA: Diagnosis not present

## 2024-06-27 DIAGNOSIS — J679 Hypersensitivity pneumonitis due to unspecified organic dust: Secondary | ICD-10-CM | POA: Diagnosis not present

## 2024-06-27 DIAGNOSIS — E1140 Type 2 diabetes mellitus with diabetic neuropathy, unspecified: Secondary | ICD-10-CM | POA: Diagnosis not present

## 2024-06-27 DIAGNOSIS — K2100 Gastro-esophageal reflux disease with esophagitis, without bleeding: Secondary | ICD-10-CM | POA: Diagnosis not present

## 2024-06-27 DIAGNOSIS — I5032 Chronic diastolic (congestive) heart failure: Secondary | ICD-10-CM | POA: Diagnosis not present

## 2024-06-27 DIAGNOSIS — Z87891 Personal history of nicotine dependence: Secondary | ICD-10-CM | POA: Diagnosis not present

## 2024-06-27 DIAGNOSIS — Z9049 Acquired absence of other specified parts of digestive tract: Secondary | ICD-10-CM | POA: Diagnosis not present

## 2024-06-27 DIAGNOSIS — K5733 Diverticulitis of large intestine without perforation or abscess with bleeding: Secondary | ICD-10-CM | POA: Diagnosis not present

## 2024-06-27 LAB — CBG MONITORING, ED
Glucose-Capillary: 108 mg/dL — ABNORMAL HIGH (ref 70–99)
Glucose-Capillary: 104 mg/dL — ABNORMAL HIGH (ref 70–99)

## 2024-06-27 LAB — PROTIME-INR
INR: 1 (ref 0.8–1.2)
Prothrombin Time: 14.2 s (ref 11.4–15.2)

## 2024-06-27 LAB — CBC WITH DIFFERENTIAL/PLATELET
Abs Immature Granulocytes: 0.02 K/uL (ref 0.00–0.07)
Basophils Absolute: 0.1 K/uL (ref 0.0–0.1)
Basophils Relative: 1 %
Eosinophils Absolute: 0.3 K/uL (ref 0.0–0.5)
Eosinophils Relative: 6 %
HCT: 32.5 % — ABNORMAL LOW (ref 36.0–46.0)
Hemoglobin: 10.4 g/dL — ABNORMAL LOW (ref 12.0–15.0)
Immature Granulocytes: 0 %
Lymphocytes Relative: 21 %
Lymphs Abs: 1.3 K/uL (ref 0.7–4.0)
MCH: 30.4 pg (ref 26.0–34.0)
MCHC: 32 g/dL (ref 30.0–36.0)
MCV: 95 fL (ref 80.0–100.0)
Monocytes Absolute: 0.4 K/uL (ref 0.1–1.0)
Monocytes Relative: 7 %
Neutro Abs: 3.9 K/uL (ref 1.7–7.7)
Neutrophils Relative %: 65 %
Platelets: 185 K/uL (ref 150–400)
RBC: 3.42 MIL/uL — ABNORMAL LOW (ref 3.87–5.11)
RDW: 12.6 % (ref 11.5–15.5)
Smear Review: NORMAL
WBC: 6.1 K/uL (ref 4.0–10.5)
nRBC: 0 % (ref 0.0–0.2)

## 2024-06-27 LAB — COMPREHENSIVE METABOLIC PANEL WITH GFR
ALT: 8 U/L (ref 0–44)
AST: 19 U/L (ref 15–41)
Albumin: 3.4 g/dL — ABNORMAL LOW (ref 3.5–5.0)
Alkaline Phosphatase: 62 U/L (ref 38–126)
Anion gap: 8 (ref 5–15)
BUN: 21 mg/dL (ref 8–23)
CO2: 25 mmol/L (ref 22–32)
Calcium: 8.7 mg/dL — ABNORMAL LOW (ref 8.9–10.3)
Chloride: 104 mmol/L (ref 98–111)
Creatinine, Ser: 0.89 mg/dL (ref 0.44–1.00)
GFR, Estimated: >60 mL/min (ref >60)
Glucose, Bld: 104 mg/dL — ABNORMAL HIGH (ref 70–99)
Potassium: 4.3 mmol/L (ref 3.5–5.1)
Sodium: 137 mmol/L (ref 135–145)
Total Bilirubin: 0.5 mg/dL (ref 0.0–1.2)
Total Protein: 6.2 g/dL — ABNORMAL LOW (ref 6.5–8.1)

## 2024-06-27 LAB — GLUCOSE, CAPILLARY
Glucose-Capillary: 123 mg/dL — ABNORMAL HIGH (ref 70–99)
Glucose-Capillary: 148 mg/dL — ABNORMAL HIGH (ref 70–99)

## 2024-06-27 LAB — HEMOGLOBIN A1C
Hgb A1c MFr Bld: 6.9 % — ABNORMAL HIGH (ref 4.8–5.6)
Mean Plasma Glucose: 151.33 mg/dL

## 2024-06-27 LAB — TYPE AND SCREEN
ABO/RH(D): O POS
Antibody Screen: NEGATIVE

## 2024-06-27 LAB — POC OCCULT BLOOD, ED: Fecal Occult Bld: POSITIVE — AB

## 2024-06-27 LAB — HEMOGLOBIN AND HEMATOCRIT, BLOOD
HCT: 29.6 % — ABNORMAL LOW (ref 36.0–46.0)
Hemoglobin: 9.8 g/dL — ABNORMAL LOW (ref 12.0–15.0)

## 2024-06-27 LAB — MAGNESIUM: Magnesium: 2.1 mg/dL (ref 1.7–2.4)

## 2024-06-27 MED ORDER — GABAPENTIN 100 MG PO CAPS: 200.0000 mg | ORAL_CAPSULE | Freq: Every day | ORAL | Status: DC

## 2024-06-27 MED ORDER — HYDRALAZINE HCL 20 MG/ML IJ SOLN: 5.0000 mg | INTRAMUSCULAR | Status: DC | PRN

## 2024-06-27 MED ORDER — LACTATED RINGERS IV SOLN: INTRAVENOUS | Status: DC

## 2024-06-27 MED ORDER — INSULIN ASPART 100 UNIT/ML IJ SOLN: 0.0000 [IU] | INTRAMUSCULAR | Status: DC

## 2024-06-27 MED ORDER — PIPERACILLIN-TAZOBACTAM 3.375 G IVPB: 3.3750 g | Freq: Three times a day (TID) | INTRAVENOUS | Status: DC

## 2024-06-27 MED ORDER — PANTOPRAZOLE SODIUM 40 MG IV SOLR
40.0000 mg | Freq: Two times a day (BID) | INTRAVENOUS | Status: DC
  Administered 2024-06-27 – 2024-06-30 (×7): 40 mg via INTRAVENOUS
  Filled 2024-06-27 (×7): qty 10

## 2024-06-27 MED ORDER — ACETAMINOPHEN 325 MG PO TABS
650.0000 mg | ORAL_TABLET | Freq: Four times a day (QID) | ORAL | Status: DC | PRN
  Administered 2024-06-28 – 2024-06-29 (×2): 650 mg via ORAL
  Filled 2024-06-27 (×2): qty 2

## 2024-06-27 MED ORDER — TRIAMCINOLONE ACETONIDE 0.1 % EX CREA: TOPICAL_CREAM | Freq: Two times a day (BID) | CUTANEOUS | Status: DC

## 2024-06-27 MED ORDER — ACETAMINOPHEN 650 MG RE SUPP: 650.0000 mg | Freq: Four times a day (QID) | RECTAL | Status: DC | PRN

## 2024-06-27 MED ORDER — PRAMIPEXOLE DIHYDROCHLORIDE 0.25 MG PO TABS
0.5000 mg | ORAL_TABLET | Freq: Every day | ORAL | Status: DC
  Administered 2024-06-27 – 2024-06-29 (×3): 0.5 mg via ORAL
  Filled 2024-06-27 (×4): qty 2

## 2024-06-27 MED ORDER — GABAPENTIN 300 MG PO CAPS: 300.0000 mg | ORAL_CAPSULE | Freq: Three times a day (TID) | ORAL | Status: DC

## 2024-06-27 MED ORDER — AMITRIPTYLINE HCL 50 MG PO TABS
50.0000 mg | ORAL_TABLET | Freq: Every day | ORAL | Status: DC
  Administered 2024-06-27 – 2024-06-29 (×3): 50 mg via ORAL
  Filled 2024-06-27 (×3): qty 1

## 2024-06-27 MED ORDER — ALBUTEROL SULFATE (2.5 MG/3ML) 0.083% IN NEBU: 2.5000 mg | INHALATION_SOLUTION | RESPIRATORY_TRACT | Status: DC | PRN

## 2024-06-27 MED ORDER — ONDANSETRON HCL 4 MG/2ML IJ SOLN: 4.0000 mg | Freq: Four times a day (QID) | INTRAMUSCULAR | Status: DC | PRN

## 2024-06-27 MED ORDER — GABAPENTIN 300 MG PO CAPS
300.0000 mg | ORAL_CAPSULE | Freq: Every day | ORAL | Status: DC
  Administered 2024-06-27 – 2024-06-29 (×3): 300 mg via ORAL
  Filled 2024-06-27 (×3): qty 1

## 2024-06-27 MED ORDER — INSULIN GLARGINE-YFGN 100 UNIT/ML ~~LOC~~ SOLN
18.0000 [IU] | Freq: Every day | SUBCUTANEOUS | Status: DC
  Administered 2024-06-27: 18 [IU] via SUBCUTANEOUS
  Filled 2024-06-27 (×2): qty 0.18

## 2024-06-27 MED ORDER — ALBUTEROL SULFATE (2.5 MG/3ML) 0.083% IN NEBU: 2.5000 mg | INHALATION_SOLUTION | Freq: Four times a day (QID) | RESPIRATORY_TRACT | Status: DC | PRN

## 2024-06-27 MED ORDER — INSULIN GLARGINE-YFGN 100 UNIT/ML ~~LOC~~ SOLN
18.0000 [IU] | Freq: Every day | SUBCUTANEOUS | Status: DC
  Administered 2024-06-27: 18 [IU] via SUBCUTANEOUS
  Filled 2024-06-27 (×3): qty 0.18

## 2024-06-27 NOTE — Plan of Care (Signed)
  Problem: Coping: Goal: Ability to adjust to condition or change in health will improve 06/27/2024 1533 by Detrich Rakestraw, LPN Outcome: Progressing 06/27/2024 1533 by Sareen Randon, LPN Outcome: Progressing   Problem: Fluid Volume: Goal: Ability to maintain a balanced intake and output will improve 06/27/2024 1533 by Ambert Virrueta, LPN Outcome: Progressing 06/27/2024 1533 by Even Budlong, LPN Outcome: Progressing   Problem: Health Behavior/Discharge Planning: Goal: Ability to identify and utilize available resources and services will improve 06/27/2024 1533 by Terin Dierolf, LPN Outcome: Progressing 06/27/2024 1533 by Trishia Cuthrell, LPN Outcome: Progressing Goal: Ability to manage health-related needs will improve 06/27/2024 1533 by Alpa Salvo, LPN Outcome: Progressing 06/27/2024 1533 by Devann Cribb, LPN Outcome: Progressing

## 2024-06-27 NOTE — H&P (Signed)
 [facility] HPI   Cheryl Brown FMW:[id number] DOB: Jul 03, 1936 DOA: 06/26/2024 From: Home with daughter code Status confirmed DNR status  PCP: [name], MD   Chief Complaint: Bleeding  HPI:  88 year old white female DM TY 2 with neuropathy, HTN h/o known sensorineural hearing loss followed by Dr. Carlie  COPD/emphysema follows with pulmonology Dr. Sanjuana smoking 1988-she is chronically on oxygen  Previous necrotizing pneumonia Possible IPF--- bronchoscopy showed lymphocytosis granulomas on path and possibility of exposure to hairspray causing hypersensitivity pneumonitis Positive ANA in the past followed by Dr. [name] antibody and was told no rheumatological issue OSA  She has chronic respiratory failure uses oxygen  HFpEF last EF 65% Class I obesity BMI 35   Previous hospitalization in 2018 for small bowel obstruction There is some report in the [facility] progress notes that she has with her that she had a colonoscopy in 2014?  I am unable to see those records   followed by Dr. [name] gastroenterology-intolerant to Linzess as this causes along with several other medications for constipation severe neuropathy and she cannot take them regularly-the only thing that seems to work is milk of magnesium mag citrate-had an endoscopy in 2004 for stomach stricture which was apparently nonmalignant I cannot see the records  She occasionally takes Goody powders but does not take them regularly according to her and her daughter-she recently started taking Ozempic about 2 months ago and has become somewhat nauseated with this and it was initiated by primary care for better diabetic control  11/15 experienced 3 episodes of what sounds like unformed bright red stool per rectum with some balls in it no nausea no vomiting no real abdominal pain the stool was somewhat loose according to her and her daughter-she was not able to eat a whole lot subsequent to that had some anorexia and  yesterday evening 11/17 had 1 more episode of what seemed like darker stool She had not taken any of the Linzess or milk of magnesium per her report and this happened on its own She experienced some lightheadedness and went to primary care who immediately sent her here there is some documentation in the notes of hypotension at the time   Review of Systems:  As mentioned above in HPI are pertinent +'s Pertinent negatives as per below   ED Course: Kept n.p.o. saline 75 cc/H placed-Protonix 40 twice daily placed placed on SCDs   Past Medical History:  Diagnosis Date   Blood transfusion    COPD (chronic obstructive pulmonary disease) (HCC)    Diabetes mellitus    Dysmetabolic syndrome    Hyperlipemia    Hypertension    IBS (irritable bowel syndrome)    Neoplasm of pituitary gland    Dr.Nudelman    Palpitations    Pituitary adenoma (HCC) 1991   Dr.Love and Dr.Nudelman    Recurrent UTI 2013-14   Dr. Terence and Dr. Estelle   Reflux esophagitis    Restless leg syndrome    Scoliosis    Spinal Stenosis   Shingles outbreak    Past Surgical History:  Procedure Laterality Date   ANKLE FRACTURE SURGERY     left ankle   BREAST EXCISIONAL BIOPSY Bilateral    CHOLECYSTECTOMY     COLONOSCOPY  2014   [facility], [facility]   CYSTOSCOPY  2014    Dr Terence   ESOPHAGEAL DILATION  01/2003   FINGER AMPUTATION     related to work injury    g2 p2  KNEE SURGERY  08/2007    complicated by difficult resuscitation post anesthesia    LAPAROSCOPIC CHOLECYSTECTOMY  03/2011   [facility], [place]   PARTIAL HYSTERECTOMY     for dysfunctional bleeding    pituitary adenoma     Dr. Maurice   UPPER GI ENDOSCOPY  2014   gastric polyp   URETHRAL DILATION  2014   VIDEO BRONCHOSCOPY Bilateral 06/13/2019   Procedure: VIDEO BRONCHOSCOPY WITH FLUORO;  Surgeon: Mannam, Praveen, MD;  Location: [facility] ENDOSCOPY;  Service: Cardiopulmonary;  Laterality: Bilateral;    reports that she quit smoking about 45  years ago. Her smoking use included cigarettes. She started smoking about 70 years ago. She has a 12.5 pack-year smoking history. She has never used smokeless tobacco. She reports that she does not drink alcohol and does not use drugs.  Mobility: Relatively independent at baseline  Allergies  Allergen Reactions   Morphine And Codeine Shortness Of Breath   Bacitracin-Polymyxin B     Other reaction(s): inflammation  Other Reaction(s): Unknown   Codeine Nausea Only    Other reaction(s): nausea/rash/itching  Other Reaction(s): Unknown   Empagliflozin Other (See Comments)    Other reaction(s): rash   Ipratropium-Albuterol Other (See Comments)    VERY NERVOUS   Metformin  Hcl Other (See Comments)    Other reaction(s): severe abdominal pain and diarrhea   Morphine Sulfate     Other reaction(s): shortness of breath  Other Reaction(s): Unknown   Neosporin [Neomycin-Bacitracin Zn-Polymyx] Other (See Comments)    Had knee surgery and it did not help me heal   Niaspan  [Niacin ] Other (See Comments)    Abdominal pain   Nitrofurantoin Macrocrystal Nausea Only   Norvasc  [Amlodipine  Besylate] Other (See Comments)    10/15/14 headache 08/27/15 abdominal pain, indigestion   Novolin N [Insulin  Isophane]     Other reaction(s): itching / rash   Prednisone     Pregabalin Swelling   Propoxyphene N-Acetaminophen  Other (See Comments)    UPSET STOMACH   Simvastatin Other (See Comments)    Leg cramps   Sulfa  Antibiotics     Other reaction(s): Rash/itching   Sulfacetamide Sodium-Sulfur     Other Reaction(s): Unknown   Sulfonamide Derivatives Itching   Pramipexole  Dihydrochloride Itching and Rash    Patient is actively taking    Family History  Problem Relation Age of Onset   Alzheimer's disease Mother    Arthritis Mother    Mental illness Mother        alzheimers   Bone cancer Father    Prostate cancer Father    Diabetes Father    Coronary artery disease Father    Cancer Father         bone, prostate   Alzheimer's disease Maternal Aunt    Mental illness Maternal Aunt        alzheimers   Mitral valve prolapse Sister    Alzheimer's disease Sister    Hypertension Sister    Diabetes Sister    Stroke Paternal Grandmother    Osteoporosis Sister    Breast cancer Sister 43   Mental illness Maternal Grandmother        alzheimers   Prior to Admission medications   Medication Sig Start Date End Date Taking? Authorizing Provider  albuterol (VENTOLIN HFA) 108 (90 Base) MCG/ACT inhaler Inhale 2 puffs into the lungs every 6 (six) hours as needed.    [provider]  amitriptyline (ELAVIL) 50 MG tablet Take 50 mg by mouth at bedtime.  [provider]  atorvastatin (LIPITOR) 20 MG tablet Take 20 mg by mouth daily. 04/17/22   [provider]  BD PEN NEEDLE NANO U/F 32G X 4 MM MISC use as directed once daily 07/28/16   Trixie File, MD  betamethasone valerate (VALISONE) 0.1 % cream APPLY A THIN LAYER TO AFFECTED AREA(S) TWICE A DAY FOR 2 WEEKS THEN USE 2 TO 3 TIMES A WEEK    [provider]  estradiol (ESTRACE) 0.1 MG/GM vaginal cream Place 1 g vaginally 3 (three) times a week. 09/10/20   [provider]  furosemide  (LASIX ) 20 MG tablet Take 1 tablet (20 mg total) by mouth daily. Patient taking differently: Take 20 mg by mouth as needed for fluid or edema. 01/27/16   Wonda Sharper, MD  gabapentin  (NEURONTIN ) 100 MG capsule Take 200 mg by mouth at bedtime. 04/20/23   [provider]  gabapentin  (NEURONTIN ) 100 MG capsule Take 3 capsules (300 mg total) by mouth 3 (three) times daily as needed. 06/05/24   Onita Duos, MD  glimepiride (AMARYL) 2 MG tablet Take 4 mg by mouth daily. 06/15/19   [provider]  glucose blood (ONE TOUCH ULTRA TEST) test strip 1 each by Other route daily. Use to check blood sugar daily Dx: E11.41 03/14/15   Tish Elsie FALCON, MD  losartan  (COZAAR ) 25 MG tablet Take 1 tablet (25 mg total) by mouth  daily. 05/12/24   Lucien Orren SAILOR, PA-C  magnesium hydroxide (MILK OF MAGNESIA) 400 MG/5ML suspension Take by mouth daily as needed for mild constipation.    [provider]  omeprazole (PRILOSEC OTC) 20 MG tablet daily.    [provider]  [facility] LANCETS MISC Check blood sugar as directed 02/16/11   Tish Elsie FALCON, MD  OXYGEN  See admin instructions. Patient take 3 liters at bedtime and at daytime if needed    [provider]  OZEMPIC, 0.25 OR 0.5 MG/DOSE, 2 MG/3ML SOPN once a week. 05/08/24   [provider]  pramipexole  (MIRAPEX ) 0.5 MG tablet Take 0.5 mg by mouth at bedtime.    [provider]  spironolactone  (ALDACTONE ) 25 MG tablet TAKE 1 TABLET (25 MG TOTAL) BY MOUTH DAILY. 06/09/24   Wonda Sharper, MD  TOUJEO  SOLOSTAR 300 UNIT/ML SOPN Inject 42 Units into the skin every evening.  04/07/19   [provider]    Physical Exam:  Vitals:   06/27/24 0500 06/27/24 0600  BP: (!) 100/57 132/60  Pulse: 62 62  Resp: 17 16  Temp:    SpO2: 100% 100%    Awake coherent engaging female no distress Chest is clear no wheeze no rales no rhonchi S1-S2 no murmur no rub no gallop Abdomen is soft with no rebound no guarding it is quite patulous When I examined her rectum she has dark red stool around her anus and a 6:00 hemorrhoid She has no lower extremity edema although she is somewhat swollen in general she has 2 prior knee replacements Power is 5/5 reflexes are 2/3 ROM is intact with no focal deficit   I have personally reviewed following labs and imaging studies  Labs:  Sodium 139 potassium 4.5 BUN/creatinine 20/0.8 AST/ALT 16/7 WBC 8.7 hemoglobin 11.2 platelet 100  Imaging studies:  CT abdomen pelvis no acute localizing process in abdomen or pelvis colonic diverticulosis without diverticulitis indeterminant hypodensities in spleen (2.1 cm recommend further evaluation with ultrasound aortic atherosclerosis-status post  hysterectomy no ascites surgically absent gallbladder subcentimeter hypodensity right lobe liver possible  hemangioma-2.1  Medical tests:  EKG independently reviewed: No EKG performed  Test discussed with performing physician: No  Decision to obtain old records:  Yes  Review and summation of old records:  Yes it is best possible  Principal Problem:   Acute diverticulitis Active Problems:   Acute lower GI bleeding   Assessment/Plan ?  Diverticular bleed versus stercoral colitis History not entirely convincing for diverticular bleed Patient has a long history of intermittent constipation and is intolerant to several purgative laxatives other than milk of magnesium and I wonder if she has some diverticulitis in addition CT does not clearly call this diverticulitis-GI should see her and decide whether she needs a bowel prep and she can have sips of fluid She has been placed on Protonix 40 every 12 IV agree with getting hemoglobin and hematocrit every 12 for now although hemodynamically she is stable and can go to telemetry IV saline 75 cc/H ongoing Clear liquid diet until she is seen by gastroenterology in anticipation of prep etc. COPD/emphysema overlap with possible IPF She has been told to stop using hairspray which is probably the cause-ANA was positive in the past-defer to pulmonology as an outpatient From my perspective she is stable for scope this is a stable process She will need outpatient follow-up with her pulmonologist resume albuterol she has no wheeze currently DM TY 2 with severe neuropathy Recent start Ozempic which may be cause for some of her nausea which we will stop and have her discuss with her outpatient physician I have stopped her high dose Toujeo  and stopped her Amaryl-she has been placed on long-acting 18 units given she is relatively n.p.o.-we will start sliding scale for her at this time every 4 hours until it can be determined when she will be scoped and if  she is cleared for a liquid diet she can have that Resume gabapentin  and amitriptyline for her severe neuropathy--- follow-up with Dr. Ross in the outpatient setting for further instructions Lumbago Was supposed to have an MRI of the back today which has been canceled by daughter-continue meds as above for neuropathy HFpEF-careful volume resuscitation strategy Rescheduled outpatient echo that was ordered--Lasix  20 Cozaar  25 Aldactone  25 have all been held and can be resumed As needed hydralazine  ordered.  Thrombocytopenia which appears to be new This looks more like a lab error-repeat this morning showed platelets in the 185 range platelet smear is normal BMI 35 restrictive body habitus Needs to attempt weight loss-Ozempic is not the answer for her it sounds like she has side effects Hold statin until discharge  Severity of Illness: The appropriate patient status for this patient is INPATIENT. Inpatient status is judged to be reasonable and necessary in order to provide the required intensity of service to ensure the patient's safety. The patient's presenting symptoms, physical exam findings, and initial radiographic and laboratory data in the context of their chronic comorbidities is felt to place them at high risk for further clinical deterioration. Furthermore, it is not anticipated that the patient will be medically stable for discharge from the hospital within 2 midnights of admission.   * I certify that at the point of admission it is my clinical judgment that the patient will require inpatient hospital care spanning beyond 2 midnights from the point of admission due to high intensity of service, high risk for further deterioration and high frequency of surveillance required.*   Family Communication: Daughter at the bedside  DVT ppx: SCD Consults called & Whom: GI consulted by  the ED  Time spent: 90 minutes  Linnell Swords, MD  [facility] --Via amion app OR , [email or web address]; password  [facility]  06/27/2024, 6:11 AM

## 2024-06-27 NOTE — Consult Note (Signed)
 Referring Provider: TH Primary Care Physician:  [name], MD Primary Gastroenterologist:  Dr. [name]  Reason for Consultation: Rectal bleeding  HPI: Cheryl Brown is a 88 y.o. female with past medical history of chronic constipation, COPD, CHF with preserved EF, possible IPF, history of necrotizing pneumonia and diabetes presented to the hospital with rectal bleeding a few days duration. Blood work this morning showed mild drop in hemoglobin to 10.4, normal CMP.  Occult blood positive.  Normal INR. CT abdomen pelvis with IV contrast showed no acute changes and colonic diverticulosis.  GI is consulted for further evaluation.  Patient seen and examined at bedside.  Patient denies similar bleeding in the past.  She has seen some blood on the tissue paper before but this time it was large amount of bright red blood in the rectum without any stool.  Had some abdominal discomfort prior to initial bowel movement which subsequently resolved.  Denied any nausea or vomiting.  She took some BC's powder and some Aleve last week for her headache.    Past Medical History:  Diagnosis Date   Blood transfusion    COPD (chronic obstructive pulmonary disease) (HCC)    Diabetes mellitus    Dysmetabolic syndrome    Hyperlipemia    Hypertension    IBS (irritable bowel syndrome)    Neoplasm of pituitary gland    Dr.Nudelman    Palpitations    Pituitary adenoma (HCC) 1991   Dr.Love and Dr.Nudelman    Recurrent UTI 2013-14   Dr. Terence and Dr. Estelle   Reflux esophagitis    Restless leg syndrome    Scoliosis    Spinal Stenosis   Shingles outbreak     Past Surgical History:  Procedure Laterality Date   ANKLE FRACTURE SURGERY     left ankle   BREAST EXCISIONAL BIOPSY Bilateral    CHOLECYSTECTOMY     COLONOSCOPY  2014   [facility], [facility]   CYSTOSCOPY  2014    Dr Terence   ESOPHAGEAL DILATION  01/2003   FINGER AMPUTATION     related to work injury    g2 p2     KNEE SURGERY   08/2007    complicated by difficult resuscitation post anesthesia    LAPAROSCOPIC CHOLECYSTECTOMY  03/2011   [facility] [place]   PARTIAL HYSTERECTOMY     for dysfunctional bleeding    pituitary adenoma     Dr. Maurice   UPPER GI ENDOSCOPY  2014   gastric polyp   URETHRAL DILATION  2014   VIDEO BRONCHOSCOPY Bilateral 06/13/2019   Procedure: VIDEO BRONCHOSCOPY WITH FLUORO;  Surgeon: Mannam, Praveen, MD;  Location: [facility] ENDOSCOPY;  Service: Cardiopulmonary;  Laterality: Bilateral;    Prior to Admission medications   Medication Sig Start Date End Date Taking? Authorizing Provider  albuterol (VENTOLIN HFA) 108 (90 Base) MCG/ACT inhaler Inhale 2 puffs into the lungs every 6 (six) hours as needed.    [provider]  amitriptyline (ELAVIL) 50 MG tablet Take 50 mg by mouth at bedtime.    [provider]  atorvastatin (LIPITOR) 20 MG tablet Take 20 mg by mouth daily. 04/17/22   [provider]  BD PEN NEEDLE NANO U/F 32G X 4 MM MISC use as directed once daily 07/28/16   Trixie File, MD  betamethasone valerate (VALISONE) 0.1 % cream APPLY A THIN LAYER TO AFFECTED AREA(S) TWICE A DAY FOR 2 WEEKS THEN USE 2 TO 3 TIMES A WEEK    [provider]  estradiol (ESTRACE) 0.1 MG/GM vaginal cream Place 1 g vaginally 3 (three) times a week. 09/10/20   [provider]  furosemide  (LASIX ) 20 MG tablet Take 1 tablet (20 mg total) by mouth daily. Patient taking differently: Take 20 mg by mouth as needed for fluid or edema. 01/27/16   Wonda Sharper, MD  gabapentin  (NEURONTIN ) 100 MG capsule Take 200 mg by mouth at bedtime. 04/20/23   [provider]  gabapentin  (NEURONTIN ) 100 MG capsule Take 3 capsules (300 mg total) by mouth 3 (three) times daily as needed. 06/05/24   Onita Duos, MD  glimepiride (AMARYL) 2 MG tablet Take 4 mg by mouth daily. 06/15/19   [provider]  glucose blood (ONE TOUCH ULTRA TEST) test strip 1 each by Other route daily. Use to check  blood sugar daily Dx: E11.41 03/14/15   Tish Elsie FALCON, MD  losartan  (COZAAR ) 25 MG tablet Take 1 tablet (25 mg total) by mouth daily. 05/12/24   Lucien Orren SAILOR, PA-C  magnesium hydroxide (MILK OF MAGNESIA) 400 MG/5ML suspension Take by mouth daily as needed for mild constipation.    [provider]  omeprazole (PRILOSEC OTC) 20 MG tablet daily.    [provider]  [facility] LANCETS MISC Check blood sugar as directed 02/16/11   Tish Elsie FALCON, MD  OXYGEN  See admin instructions. Patient take 3 liters at bedtime and at daytime if needed    [provider]  OZEMPIC, 0.25 OR 0.5 MG/DOSE, 2 MG/3ML SOPN once a week. 05/08/24   [provider]  pramipexole  (MIRAPEX ) 0.5 MG tablet Take 0.5 mg by mouth at bedtime.    [provider]  spironolactone  (ALDACTONE ) 25 MG tablet TAKE 1 TABLET (25 MG TOTAL) BY MOUTH DAILY. 06/09/24   Cooper, Michael, MD  TOUJEO  SOLOSTAR 300 UNIT/ML SOPN Inject 42 Units into the skin every evening.  04/07/19   [provider]    Scheduled Meds:  amitriptyline  50 mg Oral QHS   gabapentin   200 mg Oral QHS   gabapentin   300 mg Oral TID   insulin  aspart  0-6 Units Subcutaneous Q4H   insulin  glargine-yfgn  18 Units Subcutaneous Daily   insulin  glargine-yfgn  18 Units Subcutaneous QHS   pantoprazole (PROTONIX) IV  40 mg Intravenous Q12H   pramipexole   0.5 mg Oral QHS   triamcinolone  cream   Topical BID   Continuous Infusions:  lactated ringers 75 mL/hr at 06/27/24 0752   PRN Meds:.acetaminophen  **OR** acetaminophen , albuterol, hydrALAZINE , ondansetron  (ZOFRAN ) IV  Allergies as of 06/26/2024 - Review Complete 06/26/2024  Allergen Reaction Noted   Morphine and codeine Shortness Of Breath 12/23/2011   Bacitracin-polymyxin b  11/20/2021   Codeine Nausea Only 11/20/2021   Empagliflozin Other (See Comments) 06/06/2020   Ipratropium-albuterol Other (See Comments)    Ipratropium-albuterol Other (See Comments)  06/06/2020   Metformin  hcl Other (See Comments) 06/06/2020   Morphine  06/05/2024   Morphine sulfate  11/20/2021   Neosporin [neomycin-bacitracin zn-polymyx] Other (See Comments) 03/04/2018   Niaspan  [niacin ] Other (See Comments) 02/16/2011   Nitrofurantoin macrocrystal Nausea Only 03/04/2018   Norvasc  [amlodipine  besylate] Other (See Comments) 10/15/2014   Novolin n [insulin  isophane]  11/20/2021   Prednisone   02/26/2023   Pregabalin Swelling    Propoxyphene n-acetaminophen  Other (See Comments)    Simvastatin Other (See Comments)    Sulfa  antibiotics  03/04/2018   Sulfacetamide sodium-sulfur  06/05/2024   Sulfonamide derivatives Itching    Pramipexole  Itching and Rash 06/06/2020  Pramipexole  dihydrochloride Itching and Rash     Family History  Problem Relation Age of Onset   Alzheimer's disease Mother    Arthritis Mother    Mental illness Mother        alzheimers   Bone cancer Father    Prostate cancer Father    Diabetes Father    Coronary artery disease Father    Cancer Father        bone, prostate   Alzheimer's disease Maternal Aunt    Mental illness Maternal Aunt        alzheimers   Mitral valve prolapse Sister    Alzheimer's disease Sister    Hypertension Sister    Diabetes Sister    Stroke Paternal Grandmother    Osteoporosis Sister    Breast cancer Sister 73   Mental illness Maternal Grandmother        alzheimers    Social History   Socioeconomic History   Marital status: Married    Spouse name: Not on file   Number of children: 2   Years of education: 9th   Highest education level: Not on file  Occupational History   Occupation: Retired  Tobacco Use   Smoking status: Former    Current packs/day: 0.00    Average packs/day: 0.5 packs/day for 25.0 years (12.5 ttl pk-yrs)    Types: Cigarettes    Start date: 08/10/1953    Quit date: 08/10/1978    Years since quitting: 45.9   Smokeless tobacco: Never  Vaping Use   Vaping status: Never Used  Substance  and Sexual Activity   Alcohol use: No   Drug use: No   Sexual activity: Not on file  Other Topics Concern   Not on file  Social History Narrative   Lives at home with her husband.   Right-handed.   No caffeine use.   Social Drivers of Corporate Investment Banker Strain: Not on file  Food Insecurity: Not on file  Transportation Needs: Not on file  Physical Activity: Not on file  Stress: Not on file  Social Connections: Not on file  Intimate Partner Violence: Not on file    Review of Systems: All negative except as stated above in HPI.  Physical Exam: Vital signs: Vitals:   06/27/24 0745 06/27/24 0751  BP: 110/63   Pulse: 62   Resp: 13   Temp:  98.1 F (36.7 C)  SpO2: 100%      General:   Alert,  Well-developed, well-nourished, pleasant and cooperative in NAD Lungs: No visible respiratory distress Heart:  Regular rate and rhythm; no murmurs, clicks, rubs,  or gallops. Abdomen: Soft, nontender, nondistended, bowel sound present, no peritoneal signs Rectal: Declined by patient Alert and oriented x 3 Mood and affect normal  GI:  Lab Results: Recent Labs    06/26/24 1716 06/27/24 0445  WBC 8.7 6.1  HGB 11.2* 10.4*  HCT 34.3* 32.5*  PLT 100* 185   BMET Recent Labs    06/26/24 1716 06/27/24 0445  NA 139 137  K 4.5 4.3  CL 103 104  CO2 26 25  GLUCOSE 82 104*  BUN 20 21  CREATININE 0.89 0.89  CALCIUM  9.0 8.7*   LFT Recent Labs    06/27/24 0445  PROT 6.2*  ALBUMIN 3.4*  AST 19  ALT 8  ALKPHOS 62  BILITOT 0.5   PT/INR Recent Labs    06/27/24 0445  LABPROT 14.2  INR 1.0     Studies/Results: CT ABDOMEN  PELVIS W CONTRAST Result Date: 06/26/2024 CLINICAL DATA:  Bowel obstruction suspected. EXAM: CT ABDOMEN AND PELVIS WITH CONTRAST TECHNIQUE: Multidetector CT imaging of the abdomen and pelvis was performed using the standard protocol following bolus administration of intravenous contrast. RADIATION DOSE REDUCTION: This exam was performed  according to the departmental dose-optimization program which includes automated exposure control, adjustment of the mA and/or kV according to patient size and/or use of iterative reconstruction technique. CONTRAST:  75mL OMNIPAQUE  IOHEXOL  350 MG/ML SOLN COMPARISON:  CT abdomen and pelvis 10/08/2023. FINDINGS: Lower chest: There are emphysematous changes and scarring in the lung bases. Hepatobiliary: There is a subcentimeter hypodensity in the right lobe of the liver, possible hemangioma. This is similar to prior. No new liver lesions are seen. The gallbladder surgically absent. There is no biliary ductal dilatation. Pancreas: Unremarkable. No pancreatic ductal dilatation or surrounding inflammatory changes. Spleen: There scattered hypodensities throughout the spleen which are indeterminate. At least 1 of these is new from prior examination measuring 2.1 cm on image 3/19. The spleen is normal in size. Adrenals/Urinary Tract: There is no hydronephrosis or perinephric fat stranding. The bladder is within normal limits. There are hypodensities in both kidneys which are too small to characterize, likely cysts. Stomach/Bowel: Stomach is within normal limits. Appendix appears normal. No evidence of bowel wall thickening, distention, or inflammatory changes. There is diffuse colonic diverticulosis. There is a small hiatal hernia. Vascular/Lymphatic: Aortic atherosclerosis. No enlarged abdominal or pelvic lymph nodes. Reproductive: Status post hysterectomy. No adnexal masses. Other: No abdominal wall hernia or abnormality. No abdominopelvic ascites. Musculoskeletal: No acute or significant osseous findings. IMPRESSION: 1. No acute localizing process in the abdomen or pelvis. 2. Colonic diverticulosis without evidence for diverticulitis. 3. Indeterminate hypodensities in the spleen. Recommend further evaluation with ultrasound. 4. Aortic atherosclerosis. Aortic Atherosclerosis (ICD10-I70.0). Electronically Signed   By: Greig Pique M.D.   On: 06/26/2024 20:18    Impression/Plan: -Hematochezia.  Likely diverticular bleed.  Hemorrhoids may remain in differential but based on description and recent use of NSAIDs, it is most likely diverticular bleed - History of COPD and possible IPF - Advanced age  Recommendations ---------------------------- - No further bleeding episodes.  Recommend supportive care for now.  Monitor H&H.  Avoid NSAIDs. - Okay to have full liquid diet for now. - GI will follow    LOS: 1 day   Layla Lah  MD, FACP 06/27/2024, 8:02 AM  Contact #  [phone]

## 2024-06-27 NOTE — Progress Notes (Signed)
 Carryover admission to the Day Admitter.  I discussed this case with the EDP, Dr. Palumbo.  Per these discussions:   This is a 88 year old female who is being admitted with acute gastrointestinal bleed after presenting with new onset melena.  Per brief chart review does not appear to be on any blood thinners as an outpatient.  Has previously followed with Dr. [name] of [facility] gastroenterology, who had performed her most recent colonoscopy.  Heart rates in the 60s to 70s; pressures in the low 100s to 1 teens mmHg.   CBC reflects hemoglobin of 11.2 compared to most recent prior hemoglobin of 12.6 in June 2024. BUN 20.   CT abdomen/pelvis shows evidence of diverticulosis in the absence of diverticulitis, but otherwise no acute process.   EDP has contacted on-call [facility] gastroenterology, Dr. Burnette, requesting consult.   I have placed an order for observation in med/tele for further evaluation and management of the above.  I have placed some additional preliminary admit orders via the adult multi-morbid admission order set. I have also ordered n.p.o., prn Zofran , continuous lactated Ringer's at 75 cc/h.  Type and screen has been ordered.  Also ordered Protonix 40 mg IV twice daily as well as morning labs that include CMP, CBC, and magnesium level.  Ordered a repeat H&H to be checked at 9 AM.    Cheryl Pore, DO Hospitalist

## 2024-06-27 NOTE — ED Notes (Signed)
 CCMD called for cardiac monitoring.

## 2024-06-27 NOTE — ED Provider Notes (Signed)
 [place] EMERGENCY DEPARTMENT AT [facility] Provider Note   CSN: [id number] Arrival date & time: 06/26/24  1633     Patient presents with: Rectal Bleeding   Cheryl Brown is a 88 y.o. female.   The history is provided by the patient.  Rectal Bleeding Quality:  Black and tarry and maroon Amount:  Copious Duration:  2 days Timing:  Constant Chronicity:  New Context: constipation   Context: not hemorrhoids and not rectal injury   Similar prior episodes: no   Relieved by:  Nothing Worsened by:  Nothing Ineffective treatments:  None tried Associated symptoms: no fever, no loss of consciousness and no vomiting   Risk factors comment:  Goody powders Patient with IBS with GI bleed. Melena and now maroon stool.      Past Medical History:  Diagnosis Date   Blood transfusion    COPD (chronic obstructive pulmonary disease) (HCC)    Diabetes mellitus    Dysmetabolic syndrome    Hyperlipemia    Hypertension    IBS (irritable bowel syndrome)    Neoplasm of pituitary gland    Dr.Nudelman    Palpitations    Pituitary adenoma (HCC) 1991   Dr.Love and Dr.Nudelman    Recurrent UTI 2013-14   Dr. Terence and Dr. Estelle   Reflux esophagitis    Restless leg syndrome    Scoliosis    Spinal Stenosis   Shingles outbreak      Prior to Admission medications   Medication Sig Start Date End Date Taking? Authorizing Provider  albuterol (VENTOLIN HFA) 108 (90 Base) MCG/ACT inhaler Inhale 2 puffs into the lungs every 6 (six) hours as needed.    [provider]  amitriptyline (ELAVIL) 50 MG tablet Take 50 mg by mouth at bedtime.    [provider]  atorvastatin (LIPITOR) 20 MG tablet Take 20 mg by mouth daily. 04/17/22   [provider]  BD PEN NEEDLE NANO U/F 32G X 4 MM MISC use as directed once daily 07/28/16   Trixie File, MD  betamethasone valerate (VALISONE) 0.1 % cream APPLY A THIN LAYER TO AFFECTED AREA(S) TWICE A DAY FOR 2 WEEKS  THEN USE 2 TO 3 TIMES A WEEK    [provider]  estradiol (ESTRACE) 0.1 MG/GM vaginal cream Place 1 g vaginally 3 (three) times a week. 09/10/20   [provider]  furosemide  (LASIX ) 20 MG tablet Take 1 tablet (20 mg total) by mouth daily. Patient taking differently: Take 20 mg by mouth as needed for fluid or edema. 01/27/16   Wonda Sharper, MD  gabapentin  (NEURONTIN ) 100 MG capsule Take 200 mg by mouth at bedtime. 04/20/23   [provider]  gabapentin  (NEURONTIN ) 100 MG capsule Take 3 capsules (300 mg total) by mouth 3 (three) times daily as needed. 06/05/24   Onita Duos, MD  glimepiride (AMARYL) 2 MG tablet Take 4 mg by mouth daily. 06/15/19   [provider]  glucose blood (ONE TOUCH ULTRA TEST) test strip 1 each by Other route daily. Use to check blood sugar daily Dx: E11.41 03/14/15   Tish Elsie FALCON, MD  losartan  (COZAAR ) 25 MG tablet Take 1 tablet (25 mg total) by mouth daily. 05/12/24   Lucien Orren SAILOR, PA-C  magnesium hydroxide (MILK OF MAGNESIA) 400 MG/5ML suspension Take by mouth daily as needed for mild constipation.    [provider]  omeprazole (PRILOSEC OTC) 20 MG tablet daily.    [provider]  Cheryl Brown  LANCETS MISC Check blood sugar as directed 02/16/11   Tish Elsie FALCON, MD  OXYGEN  See admin instructions. Patient take 3 liters at bedtime and at daytime if needed    [provider]  OZEMPIC, 0.25 OR 0.5 MG/DOSE, 2 MG/3ML SOPN once a week. 05/08/24   [provider]  pramipexole  (MIRAPEX ) 0.5 MG tablet Take 0.5 mg by mouth at bedtime.    [provider]  spironolactone  (ALDACTONE ) 25 MG tablet TAKE 1 TABLET (25 MG TOTAL) BY MOUTH DAILY. 06/09/24   Wonda Sharper, MD  TOUJEO  SOLOSTAR 300 UNIT/ML SOPN Inject 42 Units into the skin every evening.  04/07/19   [provider]    Allergies: Morphine and codeine, Bacitracin-polymyxin b, Codeine, Empagliflozin, Ipratropium-albuterol, Metformin   hcl, Morphine sulfate, Neosporin [neomycin-bacitracin zn-polymyx], Niaspan  [niacin ], Nitrofurantoin macrocrystal, Norvasc  [amlodipine  besylate], Novolin n [insulin  isophane], Prednisone , Pregabalin, Propoxyphene n-acetaminophen , Simvastatin, Sulfa  antibiotics, Sulfacetamide sodium-sulfur, Sulfonamide derivatives, and Pramipexole  dihydrochloride    Review of Systems  Constitutional:  Negative for fever.  HENT:  Negative for facial swelling.   Respiratory:  Negative for shortness of breath, wheezing and stridor.   Cardiovascular:  Negative for chest pain.  Gastrointestinal:  Positive for anal bleeding, blood in stool and hematochezia. Negative for diarrhea and vomiting.  Neurological:  Negative for loss of consciousness.  All other systems reviewed and are negative.   Updated Vital Signs BP (!) 108/59   Pulse 64   Temp (!) 97.4 F (36.3 C) (Oral)   Resp 17   Ht 5' 2 (1.575 m)   Wt 88.5 kg   SpO2 100%   BMI 35.67 kg/m   Physical Exam Vitals and nursing note reviewed. Exam conducted with a chaperone present.  Constitutional:      General: She is not in acute distress.    Appearance: Normal appearance. She is well-developed.  HENT:     Head: Normocephalic and atraumatic.     Nose: Nose normal.  Eyes:     Pupils: Pupils are equal, round, and reactive to light.  Cardiovascular:     Rate and Rhythm: Normal rate and regular rhythm.     Pulses: Normal pulses.     Heart sounds: Normal heart sounds.  Pulmonary:     Effort: Pulmonary effort is normal. No respiratory distress.     Breath sounds: Normal breath sounds.  Abdominal:     General: Bowel sounds are normal. There is no distension.     Palpations: Abdomen is soft.     Tenderness: There is no abdominal tenderness. There is no guarding or rebound.  Genitourinary:    Rectum: Guaiac result positive.  Musculoskeletal:        General: Normal range of motion.     Cervical back: Normal range of motion and neck supple.  Skin:     General: Skin is warm and dry.     Capillary Refill: Capillary refill takes less than 2 seconds.     Findings: No erythema or rash.  Neurological:     General: No focal deficit present.     Mental Status: She is alert and oriented to person, place, and time.     Deep Tendon Reflexes: Reflexes normal.  Psychiatric:        Mood and Affect: Mood normal.     (all labs ordered are listed, but only abnormal results are displayed) Results for orders placed or performed during the hospital encounter of 06/26/24  Comprehensive metabolic panel   Collection Time: 06/26/24  5:16 PM  Result Value Ref Range   Sodium 139 135 - 145 mmol/L   Potassium 4.5 3.5 - 5.1 mmol/L   Chloride 103 98 - 111 mmol/L   CO2 26 22 - 32 mmol/L   Glucose, Bld 82 70 - 99 mg/dL   BUN 20 8 - 23 mg/dL   Creatinine, Ser 9.10 0.44 - 1.00 mg/dL   Calcium  9.0 8.9 - 10.3 mg/dL   Total Protein 6.8 6.5 - 8.1 g/dL   Albumin 3.7 3.5 - 5.0 g/dL   AST 16 15 - 41 U/L   ALT 7 0 - 44 U/L   Alkaline Phosphatase 61 38 - 126 U/L   Total Bilirubin 0.6 0.0 - 1.2 mg/dL   GFR, Estimated >39 >39 mL/min   Anion gap 10 5 - 15  CBC with Differential   Collection Time: 06/26/24  5:16 PM  Result Value Ref Range   WBC 8.7 4.0 - 10.5 K/uL   RBC 3.66 (L) 3.87 - 5.11 MIL/uL   Hemoglobin 11.2 (L) 12.0 - 15.0 g/dL   HCT 65.6 (L) 63.9 - 53.9 %   MCV 93.7 80.0 - 100.0 fL   MCH 30.6 26.0 - 34.0 pg   MCHC 32.7 30.0 - 36.0 g/dL   RDW 87.6 88.4 - 84.4 %   Platelets 100 (L) 150 - 400 K/uL   nRBC 0.0 0.0 - 0.2 %   Neutrophils Relative % 71 %   Neutro Abs 6.2 1.7 - 7.7 K/uL   Lymphocytes Relative 18 %   Lymphs Abs 1.6 0.7 - 4.0 K/uL   Monocytes Relative 5 %   Monocytes Absolute 0.5 0.1 - 1.0 K/uL   Eosinophils Relative 4 %   Eosinophils Absolute 0.3 0.0 - 0.5 K/uL   Basophils Relative 1 %   Basophils Absolute 0.1 0.0 - 0.1 K/uL   Immature Granulocytes 1 %   Abs Immature Granulocytes 0.04 0.00 - 0.07 K/uL  POC occult blood, ED Provider will  collect   Collection Time: 06/27/24  2:42 AM  Result Value Ref Range   Fecal Occult Bld POSITIVE (A) NEGATIVE  Type and screen [facility]   Collection Time: 06/27/24  2:49 AM  Result Value Ref Range   ABO/RH(D) O POS    Antibody Screen NEG    Sample Expiration      06/30/2024,2359 Performed at [facility], [address], [place], [place] [address]    CT ABDOMEN PELVIS W CONTRAST Result Date: 06/26/2024 CLINICAL DATA:  Bowel obstruction suspected. EXAM: CT ABDOMEN AND PELVIS WITH CONTRAST TECHNIQUE: Multidetector CT imaging of the abdomen and pelvis was performed using the standard protocol following bolus administration of intravenous contrast. RADIATION DOSE REDUCTION: This exam was performed according to the departmental dose-optimization program which includes automated exposure control, adjustment of the mA and/or kV according to patient size and/or use of iterative reconstruction technique. CONTRAST:  75mL OMNIPAQUE  IOHEXOL  350 MG/ML SOLN COMPARISON:  CT abdomen and pelvis 10/08/2023. FINDINGS: Lower chest: There are emphysematous changes and scarring in the lung bases. Hepatobiliary: There is a subcentimeter hypodensity in the right lobe of the liver, possible hemangioma. This is similar to prior. No new liver lesions are seen. The gallbladder surgically absent. There is no biliary ductal dilatation. Pancreas: Unremarkable. No pancreatic ductal dilatation or surrounding inflammatory changes. Spleen: There scattered hypodensities throughout the spleen which are indeterminate. At least 1 of these is new from prior examination measuring 2.1 cm on image 3/19. The spleen is normal in size.  Adrenals/Urinary Tract: There is no hydronephrosis or perinephric fat stranding. The bladder is within normal limits. There are hypodensities in both kidneys which are too small to characterize, likely cysts. Stomach/Bowel: Stomach is within normal limits. Appendix appears normal. No  evidence of bowel wall thickening, distention, or inflammatory changes. There is diffuse colonic diverticulosis. There is a small hiatal hernia. Vascular/Lymphatic: Aortic atherosclerosis. No enlarged abdominal or pelvic lymph nodes. Reproductive: Status post hysterectomy. No adnexal masses. Other: No abdominal wall hernia or abnormality. No abdominopelvic ascites. Musculoskeletal: No acute or significant osseous findings. IMPRESSION: 1. No acute localizing process in the abdomen or pelvis. 2. Colonic diverticulosis without evidence for diverticulitis. 3. Indeterminate hypodensities in the spleen. Recommend further evaluation with ultrasound. 4. Aortic atherosclerosis. Aortic Atherosclerosis (ICD10-I70.0). Electronically Signed   By: Greig Pique M.D.   On: 06/26/2024 20:18    None  Radiology: CT ABDOMEN PELVIS W CONTRAST Result Date: 06/26/2024 CLINICAL DATA:  Bowel obstruction suspected. EXAM: CT ABDOMEN AND PELVIS WITH CONTRAST TECHNIQUE: Multidetector CT imaging of the abdomen and pelvis was performed using the standard protocol following bolus administration of intravenous contrast. RADIATION DOSE REDUCTION: This exam was performed according to the departmental dose-optimization program which includes automated exposure control, adjustment of the mA and/or kV according to patient size and/or use of iterative reconstruction technique. CONTRAST:  75mL OMNIPAQUE  IOHEXOL  350 MG/ML SOLN COMPARISON:  CT abdomen and pelvis 10/08/2023. FINDINGS: Lower chest: There are emphysematous changes and scarring in the lung bases. Hepatobiliary: There is a subcentimeter hypodensity in the right lobe of the liver, possible hemangioma. This is similar to prior. No new liver lesions are seen. The gallbladder surgically absent. There is no biliary ductal dilatation. Pancreas: Unremarkable. No pancreatic ductal dilatation or surrounding inflammatory changes. Spleen: There scattered hypodensities throughout the spleen which  are indeterminate. At least 1 of these is new from prior examination measuring 2.1 cm on image 3/19. The spleen is normal in size. Adrenals/Urinary Tract: There is no hydronephrosis or perinephric fat stranding. The bladder is within normal limits. There are hypodensities in both kidneys which are too small to characterize, likely cysts. Stomach/Bowel: Stomach is within normal limits. Appendix appears normal. No evidence of bowel wall thickening, distention, or inflammatory changes. There is diffuse colonic diverticulosis. There is a small hiatal hernia. Vascular/Lymphatic: Aortic atherosclerosis. No enlarged abdominal or pelvic lymph nodes. Reproductive: Status post hysterectomy. No adnexal masses. Other: No abdominal wall hernia or abnormality. No abdominopelvic ascites. Musculoskeletal: No acute or significant osseous findings. IMPRESSION: 1. No acute localizing process in the abdomen or pelvis. 2. Colonic diverticulosis without evidence for diverticulitis. 3. Indeterminate hypodensities in the spleen. Recommend further evaluation with ultrasound. 4. Aortic atherosclerosis. Aortic Atherosclerosis (ICD10-I70.0). Electronically Signed   By: Greig Pique M.D.   On: 06/26/2024 20:18     Procedures   Medications Ordered in the ED  acetaminophen  (TYLENOL ) tablet 650 mg (has no administration in time range)    Or  acetaminophen  (TYLENOL ) suppository 650 mg (has no administration in time range)  ondansetron  (ZOFRAN ) injection 4 mg (has no administration in time range)  lactated ringers infusion (has no administration in time range)  pantoprazole (PROTONIX) injection 40 mg (has no administration in time range)  iohexol  (OMNIPAQUE ) 350 MG/ML injection 75 mL (75 mLs Intravenous Contrast Given 06/26/24 1854)  Medical Decision Making Patient with rectal bleeding since Saturday using Goody powders first black now maroon   Amount and/or Complexity of Data  Reviewed External Data Reviewed: notes.    Details: Previous notes  Labs: ordered.    Details: Guaiac is positive grossly. Normal sodium 139, normal potassium 4.5 normal creatinine.  Normal white count 8.7,  low hemoglobin 11.2  Radiology: ordered and independent interpretation performed.    Details: No SBO Discussion of management or test interpretation with external provider(s): Dr. Burnette consulted   Risk Decision regarding hospitalization.    Final diagnoses:  Gastrointestinal hemorrhage, unspecified gastrointestinal hemorrhage type   The patient appears reasonably stabilized for admission considering the current resources, flow, and capabilities available in the ED at this time, and I doubt any other [facility] requiring further screening and/or treatment in the ED prior to admission.  ED Discharge Orders     None          Tito Ausmus, MD 06/27/24 [phone]

## 2024-06-28 ENCOUNTER — Other Ambulatory Visit

## 2024-06-28 DIAGNOSIS — K5792 Diverticulitis of intestine, part unspecified, without perforation or abscess without bleeding: Secondary | ICD-10-CM | POA: Diagnosis not present

## 2024-06-28 LAB — GLUCOSE, CAPILLARY
Glucose-Capillary: 104 mg/dL — ABNORMAL HIGH (ref 70–99)
Glucose-Capillary: 127 mg/dL — ABNORMAL HIGH (ref 70–99)
Glucose-Capillary: 46 mg/dL — ABNORMAL LOW (ref 70–99)
Glucose-Capillary: 54 mg/dL — ABNORMAL LOW (ref 70–99)
Glucose-Capillary: 55 mg/dL — ABNORMAL LOW (ref 70–99)
Glucose-Capillary: 57 mg/dL — ABNORMAL LOW (ref 70–99)
Glucose-Capillary: 60 mg/dL — ABNORMAL LOW (ref 70–99)
Glucose-Capillary: 65 mg/dL — ABNORMAL LOW (ref 70–99)
Glucose-Capillary: 67 mg/dL — ABNORMAL LOW (ref 70–99)
Glucose-Capillary: 78 mg/dL (ref 70–99)
Glucose-Capillary: 79 mg/dL (ref 70–99)

## 2024-06-28 LAB — COMPREHENSIVE METABOLIC PANEL WITH GFR
ALT: 8 U/L (ref 0–44)
AST: 15 U/L (ref 15–41)
Albumin: 2.9 g/dL — ABNORMAL LOW (ref 3.5–5.0)
Alkaline Phosphatase: 48 U/L (ref 38–126)
Anion gap: 12 (ref 5–15)
BUN: 16 mg/dL (ref 8–23)
CO2: 22 mmol/L (ref 22–32)
Calcium: 8.7 mg/dL — ABNORMAL LOW (ref 8.9–10.3)
Chloride: 107 mmol/L (ref 98–111)
Creatinine, Ser: 0.68 mg/dL (ref 0.44–1.00)
GFR, Estimated: >60 mL/min (ref >60)
Glucose, Bld: 50 mg/dL — ABNORMAL LOW (ref 70–99)
Potassium: 3.9 mmol/L (ref 3.5–5.1)
Sodium: 141 mmol/L (ref 135–145)
Total Bilirubin: 0.4 mg/dL (ref 0.0–1.2)
Total Protein: 5.2 g/dL — ABNORMAL LOW (ref 6.5–8.1)

## 2024-06-28 LAB — CBC
HCT: 29 % — ABNORMAL LOW (ref 36.0–46.0)
Hemoglobin: 9.4 g/dL — ABNORMAL LOW (ref 12.0–15.0)
MCH: 30.4 pg (ref 26.0–34.0)
MCHC: 32.4 g/dL (ref 30.0–36.0)
MCV: 93.9 fL (ref 80.0–100.0)
Platelets: UNDETERMINED K/uL (ref 150–400)
RBC: 3.09 MIL/uL — ABNORMAL LOW (ref 3.87–5.11)
RDW: 12.4 % (ref 11.5–15.5)
WBC: 5.8 K/uL (ref 4.0–10.5)
nRBC: 0 % (ref 0.0–0.2)

## 2024-06-28 LAB — PROTIME-INR
INR: 1.1 (ref 0.8–1.2)
Prothrombin Time: 14.7 s (ref 11.4–15.2)

## 2024-06-28 MED ORDER — POLYETHYLENE GLYCOL 3350 17 G PO PACK
17.0000 g | PACK | Freq: Two times a day (BID) | ORAL | Status: DC
  Administered 2024-06-28: 17 g via ORAL
  Filled 2024-06-28 (×3): qty 1

## 2024-06-28 MED ORDER — HYDROCORTISONE ACETATE 25 MG RE SUPP
25.0000 mg | Freq: Two times a day (BID) | RECTAL | Status: DC
  Filled 2024-06-28 (×6): qty 1

## 2024-06-28 MED ORDER — BISACODYL 10 MG RE SUPP
10.0000 mg | Freq: Once | RECTAL | Status: AC
  Administered 2024-06-28: 10 mg via RECTAL
  Filled 2024-06-28: qty 1

## 2024-06-28 NOTE — Plan of Care (Signed)
 Patient calm and cooperative A&O X4. Patient hearing impaired, nursing staff spoke loudly for better communication between patient and staff. PIV replaced this shift. One episode of hypoglycemia, hospitalist aware, no new orders, hypoglycemic protocol given. Patient able to ambulate, stand by assist. Patient left with call bell in reach, bed in lowest position and side rails up.   Problem: Education: Goal: Ability to describe self-care measures that may prevent or decrease complications (Diabetes Survival Skills Education) will improve Outcome: Progressing   Problem: Coping: Goal: Ability to adjust to condition or change in health will improve Outcome: Progressing   Problem: Fluid Volume: Goal: Ability to maintain a balanced intake and output will improve Outcome: Progressing   Problem: Health Behavior/Discharge Planning: Goal: Ability to identify and utilize available resources and services will improve Outcome: Progressing   Problem: Nutritional: Goal: Maintenance of adequate nutrition will improve Outcome: Progressing   Problem: Skin Integrity: Goal: Risk for impaired skin integrity will decrease Outcome: Progressing   Problem: Education: Goal: Knowledge of General Education information will improve Description: Including pain rating scale, medication(s)/side effects and non-pharmacologic comfort measures Outcome: Progressing   Problem: Clinical Measurements: Goal: Ability to maintain clinical measurements within normal limits will improve Outcome: Progressing   Problem: Activity: Goal: Risk for activity intolerance will decrease Outcome: Progressing   Problem: Nutrition: Goal: Adequate nutrition will be maintained Outcome: Progressing   Problem: Coping: Goal: Level of anxiety will decrease Outcome: Progressing   Problem: Pain Managment: Goal: General experience of comfort will improve and/or be controlled Outcome: Progressing   Problem: Safety: Goal: Ability  to remain free from injury will improve Outcome: Progressing

## 2024-06-28 NOTE — Progress Notes (Signed)
 [facility] Gastroenterology Progress Note  Cheryl Brown 88 y.o. Aug 12, 1935  CC: Rectal bleeding, constipation   Subjective: Patient seen and examined at bedside.  Daughter at bedside.  Continues to have intermittent rectal bleeding but it has improved since admission.  Continues to have constipation.  Complaining of some generalized epigastric and lower quadrant discomfort.  ROS : Afebrile, negative for chest pain   Objective: Vital signs in last 24 hours: Vitals:   06/28/24 0359 06/28/24 0734  BP:  101/89  Pulse: 68 (!) 59  Resp:  18  Temp: 98.5 F (36.9 C) 98.4 F (36.9 C)  SpO2: 99% 100%    Physical Exam: Elderly patient, resting comfortably, not in acute distress.  Mild epigastric as well as left lower quadrant tenderness to palpation, abdomen is otherwise soft, bowel sound present, no peritoneal signs.  Somewhat hard of hearing but otherwise mood and affect normal.  Alert and oriented x 3  Lab Results: Recent Labs    06/27/24 0445 06/28/24 0201  NA 137 141  K 4.3 3.9  CL 104 107  CO2 25 22  GLUCOSE 104* 50*  BUN 21 16  CREATININE 0.89 0.68  CALCIUM  8.7* 8.7*  MG 2.1  --    Recent Labs    06/27/24 0445 06/28/24 0201  AST 19 15  ALT 8 8  ALKPHOS 62 48  BILITOT 0.5 0.4  PROT 6.2* 5.2*  ALBUMIN 3.4* 2.9*   Recent Labs    06/26/24 1716 06/27/24 0445 06/27/24 1508 06/28/24 0201  WBC 8.7 6.1  --  5.8  NEUTROABS 6.2 3.9  --   --   HGB 11.2* 10.4* 9.8* 9.4*  HCT 34.3* 32.5* 29.6* 29.0*  MCV 93.7 95.0  --  93.9  PLT 100* 185  --  PLATELET CLUMPS NOTED ON SMEAR, UNABLE TO ESTIMATE   Recent Labs    06/27/24 0445 06/28/24 0201  LABPROT 14.2 14.7  INR 1.0 1.1      Assessment/Plan: -Hematochezia.  Likely diverticular bleed.  Hemorrhoids may remain in differential but based on description and recent use of NSAIDs, it is most likely diverticular bleed -Chronic constipation.  Sometimes no bowel movement for weeks at a time. - History of COPD and  possible IPF - Advanced age   Recommendations ---------------------------- - CT abdomen pelvis with IV contrast negative for any acute changes. -Bleeding has improved.  Hemoglobin stable. - Patient has tried Linzess and MiraLAX  and other medications for constipation and according to her all of those medication gives her leg pain and leg cramps so she has not tried it.  Only thing she takes is milk of magnesia.  She is willing to try MiraLAX  while in the hospital. - Also recommend to start Anusol  suppository - Given her advanced age and underlying COPD and possible IPF, do not recommend colonoscopy at this time.   Layla Lah MD, FACP 06/28/2024, 10:25 AM  Contact #  [phone]

## 2024-06-28 NOTE — Plan of Care (Signed)

## 2024-06-28 NOTE — Progress Notes (Signed)
 [facility]   ROUNDING   NOTE Cheryl Brown FMW:[id number]  DOB: [birth year]  DOA: 06/26/2024  PCP: [name], MD  06/28/2024,1:40 PM  LOS: 1 day    Code Status: Full code     from: Home   88 year old white female DM TY 2 with neuropathy, HTN h/o known sensorineural hearing loss followed by Dr. Carlie COPD/emphysema follows with pulmonology Dr. Sanjuana smoking 1988-she is chronically on oxygen  Previous necrotizing pneumonia Possible IPF--- bronchoscopy showed lymphocytosis granulomas on path and possibility of exposure to hairspray causing hypersensitivity pneumonitis Positive ANA in the past followed by Dr. [name] antibody and was told no rheumatological issue OSA Previous history of bowel obstructio             She has chronic respiratory failure uses oxygen  HFpEF last EF 65% Class I obesity BMI 35  Chronically sees Dr. [name] intolerant of several laxatives   Chronology  11/18 admit with bright blood per rectum    Pertinent imaging/studies till date  GI consulted   Assessment  & Plan :    Probable diverticular bleed 6:00 hemorrhoid Occult blood was positive INR was normal CT showed diverticulosis Hemoglobin has remained pretty stable No plans for colonoscopy in the setting of advanced COPD IPF Keep good bowel regimen, place Tucks pad and/or hydrocortisone  cream/Anusol --- currently MiraLAX  17 twice daily Continue Protonix  40 IV twice daily Trend hemoglobin and diet has been advanced to soft by GI Saline lock IV today COPD with emphysema IPF overlap Albuterol  for now continuous oxygen  can DC telemetry and may shower up and out of bed DM TY 2 severe neuropathy Hypoglycemia this admission Has pretty severe neuropathy continues on gabapentin  300 at bedtime Sliding scale 4 times daily, had hypoglycemia overnight so stopped completely long-acting insulin  and we will resume her usual meds of Amaryl in the next 24 hours Can continue Elavil  for mood/neuropathy 50  mg Lumbago HFpEF At this time holding Lasix  20 and losartan  25 but resume in 24 hours BMI 35  Data Reviewed today:  Sodium 141 potassium 3.9 BUN/creatinine 16/0.6 albumin 2.9 Hemoglobin 9.4 platelet clumped WBC 5.8  DVT prophylaxis: SCD  Status is: Inpatient Inpatient pending resolution discussed with daughter    Dispo/Global plan: Await stability of hemoglobin graduation date likely home 24-48   Time 35   Subjective:   No dark stool Using suppository Tolerating liquids has not had a full meal yet    Objective + exam Vitals:   06/28/24 0039 06/28/24 0359 06/28/24 0734 06/28/24 1243  BP: (!) 114/57  101/89 (!) 131/97  Pulse: 62 68 (!) 59 70  Resp: 18  18 20   Temp: (!) 97.4 F (36.3 C) 98.5 F (36.9 C) 98.4 F (36.9 C) 98.5 F (36.9 C)  TempSrc:  Oral    SpO2: 97% 99% 100% 100%  Weight:      Height:       Filed Weights   06/26/24 1651  Weight: 88.5 kg     Examination: EOMI NCAT no focal deficit no icterus no pallor Chest is clear Abdomen soft On oxygen  No lower extremity edema ROM intact     Scheduled Meds:  amitriptyline   50 mg Oral QHS   gabapentin   300 mg Oral QHS   hydrocortisone   25 mg Rectal BID   insulin  aspart  0-6 Units Subcutaneous Q4H   pantoprazole  (PROTONIX ) IV  40 mg Intravenous Q12H   polyethylene glycol  17 g Oral BID   pramipexole   0.5 mg Oral QHS  Continuous Infusions:  lactated ringers  75 mL/hr at 06/28/24 9390   acetaminophen  **OR** acetaminophen , albuterol , hydrALAZINE , ondansetron  (ZOFRAN ) IV  Jai-Gurmukh Deegan Valentino, MD  [facility]

## 2024-06-28 NOTE — Progress Notes (Signed)
 Patient admitted with acute lower GI bleed. Diverticular bleed versus stercoral colitis per H & P.  CT only showed diverticulosis and some hypodensities in the spleen. Ultrasound recommended but not done yet. GI consulted and does not plan on scoping due to IPF.  VSS and diet has been resumed. Initial drop in HGB but could be dilutional from fluids. There was question about new thrombocytopenia but was thought due to lab error. Does not to be any ongoing bleeding at this time. Comorbidities include obesity, DM@, neuropathy, IPF, positive ANA, HFpEF 65%, previous bowel obstruction, chronic resp failure on oxygen  (could not find how much at baseline)

## 2024-06-29 DIAGNOSIS — K5792 Diverticulitis of intestine, part unspecified, without perforation or abscess without bleeding: Secondary | ICD-10-CM | POA: Diagnosis not present

## 2024-06-29 LAB — CBC
HCT: 26.9 % — ABNORMAL LOW (ref 36.0–46.0)
Hemoglobin: 8.8 g/dL — ABNORMAL LOW (ref 12.0–15.0)
MCH: 30.8 pg (ref 26.0–34.0)
MCHC: 32.7 g/dL (ref 30.0–36.0)
MCV: 94.1 fL (ref 80.0–100.0)
Platelets: 162 K/uL (ref 150–400)
RBC: 2.86 MIL/uL — ABNORMAL LOW (ref 3.87–5.11)
RDW: 12.5 % (ref 11.5–15.5)
WBC: 5.5 K/uL (ref 4.0–10.5)
nRBC: 0 % (ref 0.0–0.2)

## 2024-06-29 LAB — BASIC METABOLIC PANEL WITH GFR
Anion gap: 9 (ref 5–15)
BUN: 14 mg/dL (ref 8–23)
CO2: 26 mmol/L (ref 22–32)
Calcium: 8.7 mg/dL — ABNORMAL LOW (ref 8.9–10.3)
Chloride: 102 mmol/L (ref 98–111)
Creatinine, Ser: 0.87 mg/dL (ref 0.44–1.00)
GFR, Estimated: >60 mL/min (ref >60)
Glucose, Bld: 108 mg/dL — ABNORMAL HIGH (ref 70–99)
Potassium: 3.9 mmol/L (ref 3.5–5.1)
Sodium: 137 mmol/L (ref 135–145)

## 2024-06-29 LAB — GLUCOSE, CAPILLARY
Glucose-Capillary: 114 mg/dL — ABNORMAL HIGH (ref 70–99)
Glucose-Capillary: 115 mg/dL — ABNORMAL HIGH (ref 70–99)
Glucose-Capillary: 126 mg/dL — ABNORMAL HIGH (ref 70–99)
Glucose-Capillary: 61 mg/dL — ABNORMAL LOW (ref 70–99)
Glucose-Capillary: 71 mg/dL (ref 70–99)
Glucose-Capillary: 74 mg/dL (ref 70–99)
Glucose-Capillary: 93 mg/dL (ref 70–99)

## 2024-06-29 MED ORDER — DEXTROSE 5 % IV SOLN: INTRAVENOUS | Status: DC

## 2024-06-29 NOTE — Plan of Care (Signed)
 Patient calm and cooperative A&O X4. Patient hearing impaired, nursing staff spoke loudly for better communication between patient and staff. One episode of hypoglycemia, juice and sugar given to bring glucose level up. Patient educated on interventions to take during hypoglycemic events. Patient states she believes glucose is low due to diet given by hospital. Patient able to ambulate, stand by assist. Patient left with call bell in reach, bed in lowest position and side rails up.   Problem: Education: Goal: Ability to describe self-care measures that may prevent or decrease complications (Diabetes Survival Skills Education) will improve Outcome: Progressing   Problem: Coping: Goal: Ability to adjust to condition or change in health will improve Outcome: Progressing   Problem: Fluid Volume: Goal: Ability to maintain a balanced intake and output will improve Outcome: Progressing   Problem: Health Behavior/Discharge Planning: Goal: Ability to identify and utilize available resources and services will improve Outcome: Progressing   Problem: Metabolic: Goal: Ability to maintain appropriate glucose levels will improve Outcome: Progressing   Problem: Nutritional: Goal: Maintenance of adequate nutrition will improve Outcome: Progressing   Problem: Education: Goal: Knowledge of General Education information will improve Description: Including pain rating scale, medication(s)/side effects and non-pharmacologic comfort measures Outcome: Progressing   Problem: Activity: Goal: Risk for activity intolerance will decrease Outcome: Progressing

## 2024-06-29 NOTE — Plan of Care (Signed)

## 2024-06-29 NOTE — Progress Notes (Signed)
 Transition of Care [facility]) - Inpatient Brief Assessment   Patient Details  Name: Cheryl Brown MRN: [id number] Date of Birth: 10-21-1935  [facility]) CM/SW Contact:    Rosaline JONELLE [name], RN Phone Number: 06/29/2024, 4:04 PM   Clinical Narrative: Patient admitted to the hospital with pneumonitis, GI bleed.  Patient is currently on RA upon assessment.  Patients that she is on chronic oxygen  at home at 3L/min [place].  Patient states that she lives in her own home with her daughter that assists with her care in the home 24 hours.  DME at the home includes can.  No other IP Care management needs.  Patient to return home with family by car when medically stable.   Transition of Care Asessment: Insurance and Status: (P) Insurance coverage has been reviewed Patient has primary care physician: (P) Yes Home environment has been reviewed: (P) from home with daughter Prior level of function:: (P) self Prior/Current Home Services: (P) No current home services Social Drivers of Health Review: (P) SDOH reviewed no interventions necessary Readmission risk has been reviewed: (P) Yes Transition of care needs: (P) no transition of care needs at this time

## 2024-06-29 NOTE — Progress Notes (Signed)
 [facility] Gastroenterology Progress Note  Cheryl Brown 88 y.o. Sep 04, 1935  CC: Rectal bleeding, constipation   Subjective: Patient seen and examined at bedside.  Daughter at bedside.  Patient just had a bowel movement which was not bloody.  She is having multiple bowel movement after using MiraLAX  yesterday.  ROS : Afebrile, negative for chest pain   Objective: Vital signs in last 24 hours: Vitals:   06/29/24 0748 06/29/24 1119  BP: 115/60 (!) 120/53  Pulse: (!) 57 65  Resp: 17 18  Temp: 97.7 F (36.5 C) 98.1 F (36.7 C)  SpO2: 100% 99%    Physical Exam: Elderly patient, resting comfortably, not in acute distress.  Mild left lower quadrant discomfort on palpation otherwise soft, bowel sound present, no peritoneal signs.  Somewhat hard of hearing but otherwise mood and affect normal.  Alert and oriented x 3  Lab Results: Recent Labs    06/27/24 0445 06/28/24 0201 06/29/24 0219  NA 137 141 137  K 4.3 3.9 3.9  CL 104 107 102  CO2 25 22 26   GLUCOSE 104* 50* 108*  BUN 21 16 14   CREATININE 0.89 0.68 0.87  CALCIUM  8.7* 8.7* 8.7*  MG 2.1  --   --    Recent Labs    06/27/24 0445 06/28/24 0201  AST 19 15  ALT 8 8  ALKPHOS 62 48  BILITOT 0.5 0.4  PROT 6.2* 5.2*  ALBUMIN 3.4* 2.9*   Recent Labs    06/26/24 1716 06/27/24 0445 06/27/24 1508 06/28/24 0201 06/29/24 0219  WBC 8.7 6.1  --  5.8 5.5  NEUTROABS 6.2 3.9  --   --   --   HGB 11.2* 10.4*   < > 9.4* 8.8*  HCT 34.3* 32.5*   < > 29.0* 26.9*  MCV 93.7 95.0  --  93.9 94.1  PLT 100* 185  --  PLATELET CLUMPS NOTED ON SMEAR, UNABLE TO ESTIMATE 162   < > = values in this interval not displayed.   Recent Labs    06/27/24 0445 06/28/24 0201  LABPROT 14.2 14.7  INR 1.0 1.1      Assessment/Plan: -Hematochezia.  Likely diverticular bleed.  Hemorrhoids may remain in differential but based on description and recent use of NSAIDs, it is most likely diverticular bleed -Chronic constipation.  Sometimes no bowel  movement for weeks at a time. - History of COPD and possible IPF - Advanced age   Recommendations ---------------------------- - CT abdomen pelvis with IV contrast negative for any acute changes. -Bleeding has improved.  Hemoglobin stable.  Last bowel movement was nonbloody. - Given her advanced age and underlying COPD and possible IPF, do not recommend colonoscopy at this time. -Discussed with the patient that if she has ongoing bleeding, we can consider flexible sigmoidoscopy for further evaluation but patient would like to hold off for now. -Hopefully discharge home tomorrow.  - Patient has tried Linzess and MiraLAX  and other medications for constipation and according to her all of those medication gives her leg pain and leg cramps so she has not tried it.  Only thing she takes is milk of magnesia.   Layla Lah MD, FACP 06/29/2024, 1:41 PM  Contact #  [phone]

## 2024-06-29 NOTE — Progress Notes (Signed)
 [facility]   ROUNDING   NOTE Cheryl Brown FMW:[id number]  DOB: May 27, 1936  DOA: 06/26/2024  PCP: [name], MD  06/29/2024,10:25 AM  LOS: 2 days    Code Status: Full code     from: Home   88 year old white female DM TY 2 with neuropathy, HTN h/o known sensorineural hearing loss followed by Dr. Carlie COPD/emphysema follows with pulmonology Dr. Sanjuana smoking 1988-she is chronically on oxygen  Previous necrotizing pneumonia Possible IPF--- bronchoscopy showed lymphocytosis granulomas on path and possibility of exposure to hairspray causing hypersensitivity pneumonitis Positive ANA in the past followed by Dr. [name] antibody and was told no rheumatological issue OSA Previous history of bowel obstructio             She has chronic respiratory failure uses oxygen  HFpEF last EF 65% Class I obesity BMI 35  Chronically sees Dr. [name] intolerant of several laxatives   Chronology  11/18 admit with bright blood per rectum    Pertinent imaging/studies till date  GI consulted   Assessment  & Plan :    Probable diverticular bleed 6:00 hemorrhoid Still having a little bit of bleeding-nursing reports not bright red and still dark yesterday Continue soft diet and defer to GI?  Flex sig?  Further workup COPD with emphysema IPF overlap Albuterol  for now continuous oxygen  can DC telemetry and may shower up and out of bed DM TY 2 severe neuropathy Hypoglycemia this admission Has pretty severe neuropathy continues on gabapentin  300 at bedtime Hypoglycemic overnight secondary to not eating as much-stop all coverage will give a nighttime snack-if going for flex sig in the morning needs D5 starting at midnight Can continue Elavil  for mood/neuropathy 50 mg Lumbago HFpEF At this time holding Lasix  20  Blood pressure is moderately controlled off of losartan  will resume at discharge BMI 35  Data Reviewed today:   Sodium 137 potassium 3.9 BUN/creatinine 14/0.8 Hemoglobin 8.8 platelet 162  WBC 5.5  DVT prophylaxis: SCD  Status is: Inpatient Inpatient pending resolution discussed with daughter    Dispo/Global plan: Await discussion with GI  Time 35   Subjective:   Some dark stool reported yesterday No fever chills nausea vomiting No chest pain Note hypoglycemia overnight   Objective + exam Vitals:   06/28/24 1243 06/28/24 1643 06/28/24 2030 06/29/24 0748  BP: (!) 131/97 (!) 142/74 134/65 115/60  Pulse: 70 81 89 (!) 57  Resp: 20 20  17   Temp: 98.5 F (36.9 C) 97.7 F (36.5 C) 98 F (36.7 C) 97.7 F (36.5 C)  TempSrc:      SpO2: 100% 96% 100% 100%  Weight:      Height:       Filed Weights   06/26/24 1651  Weight: 88.5 kg     Examination: EOMI NCAT no focal deficit no icterus no pallor Chest is clear Abdomen soft no rebound no guarding Lower extremities are a little bit swollen  Scheduled Meds:  amitriptyline   50 mg Oral QHS   gabapentin   300 mg Oral QHS   hydrocortisone   25 mg Rectal BID   pantoprazole  (PROTONIX ) IV  40 mg Intravenous Q12H   polyethylene glycol  17 g Oral BID   pramipexole   0.5 mg Oral QHS   Continuous Infusions:   acetaminophen  **OR** acetaminophen , albuterol , hydrALAZINE , ondansetron  (ZOFRAN ) IV  Jai-Gurmukh Kao Conry, MD  [facility]

## 2024-06-30 ENCOUNTER — Other Ambulatory Visit (HOSPITAL_COMMUNITY): Payer: Self-pay

## 2024-06-30 LAB — CBC
HCT: 28.9 % — ABNORMAL LOW (ref 36.0–46.0)
Hemoglobin: 9.3 g/dL — ABNORMAL LOW (ref 12.0–15.0)
MCH: 29.6 pg (ref 26.0–34.0)
MCHC: 32.2 g/dL (ref 30.0–36.0)
MCV: 92 fL (ref 80.0–100.0)
Platelets: UNDETERMINED K/uL (ref 150–400)
RBC: 3.14 MIL/uL — ABNORMAL LOW (ref 3.87–5.11)
RDW: 12.8 % (ref 11.5–15.5)
WBC: 5.7 K/uL (ref 4.0–10.5)
nRBC: 0 % (ref 0.0–0.2)

## 2024-06-30 LAB — GLUCOSE, CAPILLARY
Glucose-Capillary: 148 mg/dL — ABNORMAL HIGH (ref 70–99)
Glucose-Capillary: 158 mg/dL — ABNORMAL HIGH (ref 70–99)
Glucose-Capillary: 97 mg/dL (ref 70–99)

## 2024-06-30 MED ORDER — HYDROCORTISONE ACETATE 25 MG RE SUPP
25.0000 mg | Freq: Two times a day (BID) | RECTAL | 0 refills | Status: AC
  Filled 2024-06-30: qty 12, 6d supply, fill #0

## 2024-06-30 MED ORDER — PANTOPRAZOLE SODIUM 40 MG PO TBEC
40.0000 mg | DELAYED_RELEASE_TABLET | Freq: Two times a day (BID) | ORAL | 0 refills | Status: AC
  Filled 2024-06-30: qty 60, 30d supply, fill #0

## 2024-06-30 MED ORDER — GABAPENTIN 300 MG PO CAPS: 300.0000 mg | ORAL_CAPSULE | Freq: Every day | ORAL | Status: AC

## 2024-06-30 MED ORDER — TOUJEO SOLOSTAR 300 UNIT/ML ~~LOC~~ SOPN: 5.0000 [IU] | PEN_INJECTOR | Freq: Every evening | SUBCUTANEOUS | Status: AC

## 2024-06-30 NOTE — Discharge Summary (Signed)
 Physician Discharge Summary  Cheryl Brown FMW:[id number] DOB: [birth year] DOA: 06/26/2024  PCP: [name], MD  Admit date: 06/26/2024 Discharge date: 06/30/2024  Time spent: 40 minutes  Recommendations for Outpatient Follow-up:  CBC Chem-7 in 1 week New medication Protonix , Anusol --- downward adjusted long-acting insulin -May need to increase dose in the outpatient setting Holding Aldactone  until labs in the outpatient setting resumed other diuretics and antihypertensives  Discharge Diagnoses:  MAIN problem for hospitalization   Diverticular bleed self resolving Hemorrhoidal bleed  Please see below for itemized issues addressed in HOpsital- refer to other progress notes for clarity if needed  Discharge Condition: In proved  Diet recommendation: Heart healthy  Filed Weights   06/26/24 1651 06/30/24 0400  Weight: 88.5 kg 89.8 kg    History of present illness:  88 year old white female DM TY 2 with neuropathy, HTN h/o known sensorineural hearing loss followed by Dr. Carlie COPD/emphysema follows with pulmonology Dr. Sanjuana smoking 1988-she is chronically on oxygen  Previous necrotizing pneumonia Possible IPF--- bronchoscopy showed lymphocytosis granulomas on path and possibility of exposure to hairspray causing hypersensitivity pneumonitis Positive ANA in the past followed by Dr. [name] antibody and was told no rheumatological issue OSA Previous history of bowel obstructio             She has chronic respiratory failure uses oxygen  HFpEF last EF 65% Class I obesity BMI 35   Chronically sees Dr. [name] intolerant of several laxatives     Chronology  11/18 admit with bright blood per rectum      Pertinent imaging/studies till date  GI consulted    Assessment  & Plan :      Probable diverticular bleed 6:00 hemorrhoid Patient had episodic bleeding during hospital stay and finally had more formed brown stools 11/20 [facility] Dr. Elicia  saw the patient and suggested flex sig to rule out lower colon bleed however patient declined same and did not want to work this up further Discharging on Protonix  40 twice daily p.o. with strict instructions to avoid NSAIDs etc. Can follow-up with Dr. [name] in the outpatient setting and is going home on regular diet COPD with emphysema IPF overlap Albuterol  for now continuous oxygen   Will discharge on her usual home meds DM TY 2 severe neuropathy Hypoglycemia this admission Has pretty severe neuropathy continues on gabapentin  300 at bedtime Significant hypoglycemia during hospital stay so stopped all coverage It looks like her primary care physician is trying to start her on Ozempic in an effort to discontinue her long-acting insulin  I have resumed her Amaryl and given her strict instructions to use it only if she eats a large meal in the morning-I have told her daughter that we are going to cut back her long-acting insulin  to 5 units and she can up titrate the dose based on her blood sugars Can continue Elavil  for mood/neuropathy 50 mg Lumbago She is still awaiting an MRI for her lower back and will continue with her gabapentin  Elavil  etc. for discomfort HFpEF Lasix  Aldactone  losartan  held Resuming Lasix  and losartan  at discharge Aldactone  to be resumed in a week once we get labs BMI 35  Discharge Exam: Vitals:   06/30/24 0356 06/30/24 0734  BP: (!) 111/57 107/65  Pulse: 64 64  Resp: 18 16  Temp: (!) 97.3 F (36.3 C) 97.6 F (36.4 C)  SpO2: 92% 98%    Subj on day of d/c   Awake pleasant walking around the room no distress some discomfort in her feet  but otherwise seems well  General Exam on discharge  EOMI NCAT no focal deficit no icterus no pallor no wheeze no rales no rhonchi Chest is otherwise clear anteriorly S1-S2 no murmur Abdomen soft no rebound She has surgically absent digits on her right hand She has trace lower extremity edema Neurologically she is grossly  intact and she is coherent and pleasant  Discharge Instructions   Discharge Instructions     Diet - low sodium heart healthy   Complete by: As directed    Discharge instructions   Complete by: As directed    This hospital stay you were diagnosed with some bleeding and it was felt that this was either hemorrhoidal or from diverticular disease-as such we have increased your acid reducer called Protonix  to twice a day and we will call that into the pharmacy I would avoid taking any over-the-counter nonsteroidals Goody powders or aspirin at this time During your hospital stay your blood sugar was quite low probably because you were not eating as much as you usually do so we have cut back significantly your long-acting insulin  you can take Amaryl and Ozempic as you typically do but just make sure that you are eating regular amounts of food You will notice that we have held your Aldactone  until next week until you get labs because you are appetite may be somewhat diminished and you are at risk for a little bit of dehydration Once you get labs in the outpatient setting your doctor will tell you whether you can resume them Continue using milk of magnesium for constipation-we have called in a prescription for Anusol  suppositories for the small hemorrhoids that you had and you can resume your other medications without fail Your primary physician will tell you if you can come off the long-acting insulin  if you continue to use Ozempic   Increase activity slowly   Complete by: As directed       Allergies as of 06/30/2024       Reactions   Morphine And Codeine Shortness Of Breath   Empagliflozin Rash   Pregabalin Swelling   Codeine Nausea Only   Ipratropium-albuterol  Other (See Comments)   VERY NERVOUS   Metformin  Hcl Other (See Comments)   Other reaction(s): severe abdominal pain and diarrhea   Niaspan  [niacin ] Other (See Comments)   Abdominal pain   Nitrofurantoin Macrocrystal Nausea Only    Norvasc  [amlodipine  Besylate] Other (See Comments)   10/15/14 headache 08/27/15 abdominal pain, indigestion   Pramipexole  Dihydrochloride Itching, Rash   Patient is actively taking    Propoxyphene N-acetaminophen  Other (See Comments)   UPSET STOMACH   Simvastatin Other (See Comments)   Leg cramps   Sulfa  Antibiotics Rash   Other reaction(s): Rash/itching        Medication List     PAUSE taking these medications    spironolactone  25 MG tablet Wait to take this until: July 05, 2024 Commonly known as: ALDACTONE  TAKE 1 TABLET (25 MG TOTAL) BY MOUTH DAILY.       STOP taking these medications    PriLOSEC OTC 20 MG tablet Generic drug: omeprazole       TAKE these medications    albuterol  108 (90 Base) MCG/ACT inhaler Commonly known as: VENTOLIN  HFA Inhale 2 puffs into the lungs every 6 (six) hours as needed for wheezing or shortness of breath.   amitriptyline  50 MG tablet Commonly known as: ELAVIL  Take 50 mg by mouth at bedtime.   atorvastatin 20 MG tablet Commonly known  as: LIPITOR Take 20 mg by mouth daily.   BD Pen Needle Nano U/F 32G X 4 MM Misc Generic drug: Insulin  Pen Needle use as directed once daily   betamethasone valerate 0.1 % cream Commonly known as: VALISONE APPLY A THIN LAYER TO AFFECTED AREA(S) TWICE A DAY FOR 2 WEEKS THEN USE 2 TO 3 TIMES A WEEK   estradiol 0.1 MG/GM vaginal cream Commonly known as: ESTRACE Place 1 g vaginally 3 (three) times a week.   furosemide  20 MG tablet Commonly known as: LASIX  Take 1 tablet (20 mg total) by mouth daily.   gabapentin  300 MG capsule Commonly known as: NEURONTIN  Take 1 capsule (300 mg total) by mouth at bedtime. What changed:  medication strength how much to take Another medication with the same name was removed. Continue taking this medication, and follow the directions you see here.   glimepiride 2 MG tablet Commonly known as: AMARYL Take 4 mg by mouth daily.   glucose blood test  strip Commonly known as: ONE TOUCH ULTRA TEST 1 each by Other route daily. Use to check blood sugar daily Dx: E11.41   hydrocortisone  25 MG suppository Commonly known as: ANUSOL -HC Place 1 suppository (25 mg total) rectally 2 (two) times daily.   losartan  25 MG tablet Commonly known as: COZAAR  Take 1 tablet (25 mg total) by mouth daily.   magnesium hydroxide 400 MG/5ML suspension Commonly known as: MILK OF MAGNESIA Take by mouth daily as needed for mild constipation.   OneTouch Delica Lancets Misc Check blood sugar as directed   OXYGEN  Place 3 L/min into the nose See admin instructions. Patient take 3 liters at bedtime and at daytime if needed   Ozempic (0.25 or 0.5 MG/DOSE) 2 MG/3ML Sopn Generic drug: Semaglutide(0.25 or 0.5MG /DOS) once a week.   pantoprazole  40 MG tablet Commonly known as: Protonix  Take 1 tablet (40 mg total) by mouth 2 (two) times daily.   pramipexole  1 MG tablet Commonly known as: MIRAPEX  Take 1 mg by mouth at bedtime.   Toujeo  SoloStar 300 UNIT/ML Solostar Pen Generic drug: insulin  glargine (1 Unit Dial) Inject 5 Units into the skin every evening. What changed: how much to take       Allergies  Allergen Reactions   Morphine And Codeine Shortness Of Breath   Empagliflozin Rash   Pregabalin Swelling   Codeine Nausea Only   Ipratropium-Albuterol  Other (See Comments)    VERY NERVOUS   Metformin  Hcl Other (See Comments)    Other reaction(s): severe abdominal pain and diarrhea   Niaspan  [Niacin ] Other (See Comments)    Abdominal pain   Nitrofurantoin Macrocrystal Nausea Only   Norvasc  [Amlodipine  Besylate] Other (See Comments)    10/15/14 headache 08/27/15 abdominal pain, indigestion   Pramipexole  Dihydrochloride Itching and Rash    Patient is actively taking    Propoxyphene N-Acetaminophen  Other (See Comments)    UPSET STOMACH   Simvastatin Other (See Comments)    Leg cramps   Sulfa  Antibiotics Rash    Other reaction(s): Rash/itching       The results of significant diagnostics from this hospitalization (including imaging, microbiology, ancillary and laboratory) are listed below for reference.    Significant Diagnostic Studies: CT ABDOMEN PELVIS W CONTRAST Result Date: 06/26/2024 CLINICAL DATA:  Bowel obstruction suspected. EXAM: CT ABDOMEN AND PELVIS WITH CONTRAST TECHNIQUE: Multidetector CT imaging of the abdomen and pelvis was performed using the standard protocol following bolus administration of intravenous contrast. RADIATION DOSE REDUCTION: This exam was performed according to  the departmental dose-optimization program which includes automated exposure control, adjustment of the mA and/or kV according to patient size and/or use of iterative reconstruction technique. CONTRAST:  75mL OMNIPAQUE  IOHEXOL  350 MG/ML SOLN COMPARISON:  CT abdomen and pelvis 10/08/2023. FINDINGS: Lower chest: There are emphysematous changes and scarring in the lung bases. Hepatobiliary: There is a subcentimeter hypodensity in the right lobe of the liver, possible hemangioma. This is similar to prior. No new liver lesions are seen. The gallbladder surgically absent. There is no biliary ductal dilatation. Pancreas: Unremarkable. No pancreatic ductal dilatation or surrounding inflammatory changes. Spleen: There scattered hypodensities throughout the spleen which are indeterminate. At least 1 of these is new from prior examination measuring 2.1 cm on image 3/19. The spleen is normal in size. Adrenals/Urinary Tract: There is no hydronephrosis or perinephric fat stranding. The bladder is within normal limits. There are hypodensities in both kidneys which are too small to characterize, likely cysts. Stomach/Bowel: Stomach is within normal limits. Appendix appears normal. No evidence of bowel wall thickening, distention, or inflammatory changes. There is diffuse colonic diverticulosis. There is a small hiatal hernia. Vascular/Lymphatic: Aortic atherosclerosis. No  enlarged abdominal or pelvic lymph nodes. Reproductive: Status post hysterectomy. No adnexal masses. Other: No abdominal wall hernia or abnormality. No abdominopelvic ascites. Musculoskeletal: No acute or significant osseous findings. IMPRESSION: 1. No acute localizing process in the abdomen or pelvis. 2. Colonic diverticulosis without evidence for diverticulitis. 3. Indeterminate hypodensities in the spleen. Recommend further evaluation with ultrasound. 4. Aortic atherosclerosis. Aortic Atherosclerosis (ICD10-I70.0). Electronically Signed   By: Greig Pique M.D.   On: 06/26/2024 20:18    Microbiology: No results found for this or any previous visit (from the past 240 hours).   Labs: Basic Metabolic Panel: Recent Labs  Lab 06/26/24 1716 06/27/24 0445 06/28/24 0201 06/29/24 0219  NA 139 137 141 137  K 4.5 4.3 3.9 3.9  CL 103 104 107 102  CO2 26 25 22 26   GLUCOSE 82 104* 50* 108*  BUN 20 21 16 14   CREATININE 0.89 0.89 0.68 0.87  CALCIUM  9.0 8.7* 8.7* 8.7*  MG  --  2.1  --   --    Liver Function Tests: Recent Labs  Lab 06/26/24 1716 06/27/24 0445 06/28/24 0201  AST 16 19 15   ALT 7 8 8   ALKPHOS 61 62 48  BILITOT 0.6 0.5 0.4  PROT 6.8 6.2* 5.2*  ALBUMIN 3.7 3.4* 2.9*   No results for input(s): LIPASE, AMYLASE in the last 168 hours. No results for input(s): AMMONIA in the last 168 hours. CBC: Recent Labs  Lab 06/26/24 1716 06/27/24 0445 06/27/24 1508 06/28/24 0201 06/29/24 0219 06/30/24 0527  WBC 8.7 6.1  --  5.8 5.5 5.7  NEUTROABS 6.2 3.9  --   --   --   --   HGB 11.2* 10.4* 9.8* 9.4* 8.8* 9.3*  HCT 34.3* 32.5* 29.6* 29.0* 26.9* 28.9*  MCV 93.7 95.0  --  93.9 94.1 92.0  PLT 100* 185  --  PLATELET CLUMPS NOTED ON SMEAR, UNABLE TO ESTIMATE 162 PLATELET CLUMPS NOTED ON SMEAR, UNABLE TO ESTIMATE   Cardiac Enzymes: No results for input(s): CKTOTAL, CKMB, CKMBINDEX, TROPONINI in the last 168 hours. BNP: BNP (last 3 results) No results for input(s): BNP  in the last 8760 hours.  ProBNP (last 3 results) No results for input(s): PROBNP in the last 8760 hours.  CBG: Recent Labs  Lab 06/29/24 1629 06/29/24 2137 06/30/24 0121 06/30/24 0357 06/30/24 0735  GLUCAP 93 114*  148* 158* 97    Signed:  Colen Grimes MD   [facility] 06/30/2024, 12:05 PM

## 2024-06-30 NOTE — Plan of Care (Signed)
  Problem: Fluid Volume: Goal: Ability to maintain a balanced intake and output will improve Outcome: Adequate for Discharge   Problem: Health Behavior/Discharge Planning: Goal: Ability to identify and utilize available resources and services will improve Outcome: Adequate for Discharge Goal: Ability to manage health-related needs will improve Outcome: Adequate for Discharge   Problem: Metabolic: Goal: Ability to maintain appropriate glucose levels will improve Outcome: Adequate for Discharge   Problem: Nutritional: Goal: Maintenance of adequate nutrition will improve Outcome: Adequate for Discharge Goal: Progress toward achieving an optimal weight will improve Outcome: Adequate for Discharge   Problem: Skin Integrity: Goal: Risk for impaired skin integrity will decrease Outcome: Adequate for Discharge   Problem: Tissue Perfusion: Goal: Adequacy of tissue perfusion will improve Outcome: Adequate for Discharge   Problem: Health Behavior/Discharge Planning: Goal: Ability to manage health-related needs will improve Outcome: Adequate for Discharge   Problem: Activity: Goal: Risk for activity intolerance will decrease Outcome: Adequate for Discharge   Problem: Coping: Goal: Level of anxiety will decrease Outcome: Adequate for Discharge   Problem: Elimination: Goal: Will not experience complications related to bowel motility Outcome: Adequate for Discharge   Problem: Elimination: Goal: Will not experience complications related to urinary retention Outcome: Adequate for Discharge   Problem: Skin Integrity: Goal: Risk for impaired skin integrity will decrease Outcome: Adequate for Discharge   Problem: Safety: Goal: Ability to remain free from injury will improve Outcome: Adequate for Discharge

## 2024-06-30 NOTE — Progress Notes (Signed)
 [facility]astroenterology Progress Note  Cheryl Brown 88 y.o. 07/28/1936  CC: Rectal bleeding, constipation   Subjective: Patient seen and examined at bedside.  Daughter at bedside.  Patient just had a bowel movement which was not bloody.  She is having multiple bowel movement after using MiraLAX  yesterday.  ROS : Afebrile, negative for chest pain   Objective: Vital signs in last 24 hours: Vitals:   06/30/24 0356 06/30/24 0734  BP: (!) 111/57 107/65  Pulse: 64 64  Resp: 18 16  Temp: (!) 97.3 F (36.3 C) 97.6 F (36.4 C)  SpO2: 92% 98%    Physical Exam: Elderly patient, resting comfortably, not in acute distress.  Abdomen is soft, nontender, nondistended, bowel sound present.  No peritoneal signs.  Somewhat hard of hearing but otherwise mood and affect normal.  Alert and oriented x 3  Lab Results: Recent Labs    06/28/24 0201 06/29/24 0219  NA 141 137  K 3.9 3.9  CL 107 102  CO2 22 26  GLUCOSE 50* 108*  BUN 16 14  CREATININE 0.68 0.87  CALCIUM  8.7* 8.7*   Recent Labs    06/28/24 0201  AST 15  ALT 8  ALKPHOS 48  BILITOT 0.4  PROT 5.2*  ALBUMIN 2.9*   Recent Labs    06/29/24 0219 06/30/24 0527  WBC 5.5 5.7  HGB 8.8* 9.3*  HCT 26.9* 28.9*  MCV 94.1 92.0  PLT 162 PLATELET CLUMPS NOTED ON SMEAR, UNABLE TO ESTIMATE   Recent Labs    06/28/24 0201  LABPROT 14.7  INR 1.1      Assessment/Plan: -Hematochezia.  Likely diverticular bleed.  Hemorrhoids may remain in differential but based on description and recent use of NSAIDs, it is most likely diverticular bleed -Chronic constipation.  Sometimes no bowel movement for weeks at a time. - History of COPD and possible IPF - Advanced age   Recommendations ---------------------------- - CT abdomen pelvis with IV contrast negative for any acute changes. - No further bleeding.  Hemoglobin stable. - Given her advanced age and underlying COPD and possible IPF, do not recommend colonoscopy at this time. -  Okay to discharge from GI standpoint.  GI will sign off.  Call us  back if needed.   - Patient has tried Linzess and MiraLAX  and other medications for constipation and according to her all of those medication gives her leg pain and leg cramps so she has not tried it.  Only thing she takes is milk of magnesia.   Layla Lah MD, FACP 06/30/2024, 11:41 AM  Contact #  [phone]

## 2024-06-30 NOTE — Progress Notes (Signed)
 Patient discharged home, Important Message Letter mailed to patient.

## 2024-06-30 NOTE — Care Management Important Message (Signed)
 Important Message  Patient Details  Name: Cheryl Brown MRN: [id number] Date of Birth: 1936/01/24   Important Message Given:  No     [name] 06/30/2024, 4:53 PM

## 2024-07-13 DIAGNOSIS — Z794 Long term (current) use of insulin: Secondary | ICD-10-CM | POA: Diagnosis not present

## 2024-07-13 DIAGNOSIS — E1140 Type 2 diabetes mellitus with diabetic neuropathy, unspecified: Secondary | ICD-10-CM | POA: Diagnosis not present

## 2024-07-13 DIAGNOSIS — D500 Iron deficiency anemia secondary to blood loss (chronic): Secondary | ICD-10-CM | POA: Diagnosis not present

## 2024-07-13 DIAGNOSIS — I10 Essential (primary) hypertension: Secondary | ICD-10-CM | POA: Diagnosis not present

## 2024-07-13 DIAGNOSIS — Z79899 Other long term (current) drug therapy: Secondary | ICD-10-CM | POA: Diagnosis not present

## 2024-07-20 ENCOUNTER — Ambulatory Visit (HOSPITAL_COMMUNITY)
Admit: 2024-07-20 | Discharge: 2024-07-20 | Disposition: A | Discharge status: Home / Self Care | Attending: Physician Assistant | Admitting: Physician Assistant

## 2024-07-20 DIAGNOSIS — R0609 Other forms of dyspnea: Secondary | ICD-10-CM | POA: Diagnosis present

## 2024-07-20 DIAGNOSIS — R0600 Dyspnea, unspecified: Secondary | ICD-10-CM | POA: Diagnosis not present

## 2024-07-20 LAB — ECHOCARDIOGRAM COMPLETE
Area-P 1/2: 3.1 cm2
S' Lateral: 3.2 cm

## 2024-08-01 ENCOUNTER — Ambulatory Visit: Payer: Self-pay | Admitting: Physician Assistant

## 2024-08-05 ENCOUNTER — Ambulatory Visit: Admit: 2024-08-05 | Discharge: 2024-08-05 | Disposition: A | Attending: Neurology | Admitting: Neurology

## 2024-08-05 DIAGNOSIS — R202 Paresthesia of skin: Secondary | ICD-10-CM

## 2024-08-05 DIAGNOSIS — G8929 Other chronic pain: Secondary | ICD-10-CM

## 2024-08-05 DIAGNOSIS — M542 Cervicalgia: Secondary | ICD-10-CM | POA: Diagnosis not present

## 2024-08-05 DIAGNOSIS — M5440 Lumbago with sciatica, unspecified side: Secondary | ICD-10-CM | POA: Diagnosis not present

## 2024-08-07 ENCOUNTER — Telehealth: Payer: Self-pay | Admitting: Neurology

## 2024-08-07 ENCOUNTER — Ambulatory Visit: Payer: Self-pay | Admitting: Neurology

## 2024-08-07 NOTE — Telephone Encounter (Signed)
 Please call patient MRI of the lumbar spine showed multilevel degenerative changes, there is no evidence of spinal cord or nerve root compression.   IMPRESSION: This MRI of the lumbar spine without contrast shows the following: At L3-L4, there is minimal anterolisthesis and other degenerative change causing mild spinal stenosis and moderate right greater than left lateral recess stenosis.  There does not appear to be any nerve root compression. At L4-L5, there is borderline anterolisthesis and other degenerative change causing mild to moderate right lateral recess stenosis but no spinal stenosis or nerve root compression. Milder degenerative changes at L2-L3 and L5-S1 do not lead to spinal stenosis or nerve root compression.

## 2024-08-08 NOTE — Telephone Encounter (Signed)
 Patient informed with the below result.

## 2024-08-24 ENCOUNTER — Telehealth: Payer: Self-pay

## 2024-08-24 NOTE — Telephone Encounter (Signed)
 Copied from CRM [id number]. Topic: Clinical - Medical Advice >> Aug 22, 2024 11:00 AM Dedra NOVAK wrote: Reason for CRM: Patient's daughter, Therisa, said patient normally has to have a CT scan before her appt. She would like to know if a patient needs a CT scan before her 09/07/24 appointment with Dr. Theophilus. Please call Therisa at [phone].

## 2024-09-07 ENCOUNTER — Ambulatory Visit: Admitting: Pulmonary Disease

## 2024-10-03 ENCOUNTER — Ambulatory Visit: Admitting: Pulmonary Disease

## 2024-12-26 ENCOUNTER — Ambulatory Visit: Admitting: Adult Health
